# Patient Record
Sex: Female | Born: 1960
Health system: Southern US, Community
[De-identification: ages and names within clinical notes are randomized; demographics above are authoritative.]

## PROBLEM LIST (undated history)

## (undated) DIAGNOSIS — R51 Headache: Secondary | ICD-10-CM

## (undated) DIAGNOSIS — E785 Hyperlipidemia, unspecified: Secondary | ICD-10-CM

## (undated) DIAGNOSIS — IMO0001 Reserved for inherently not codable concepts without codable children: Secondary | ICD-10-CM

## (undated) DIAGNOSIS — L98499 Non-pressure chronic ulcer of skin of other sites with unspecified severity: Secondary | ICD-10-CM

## (undated) DIAGNOSIS — I4510 Unspecified right bundle-branch block: Secondary | ICD-10-CM

## (undated) DIAGNOSIS — E7800 Pure hypercholesterolemia, unspecified: Secondary | ICD-10-CM

## (undated) DIAGNOSIS — K3533 Acute appendicitis with perforation and localized peritonitis, with abscess: Secondary | ICD-10-CM

## (undated) DIAGNOSIS — N800 Endometriosis of uterus: Secondary | ICD-10-CM

## (undated) DIAGNOSIS — E079 Disorder of thyroid, unspecified: Secondary | ICD-10-CM

## (undated) DIAGNOSIS — N8003 Adenomyosis of the uterus: Secondary | ICD-10-CM

## (undated) DIAGNOSIS — N83209 Unspecified ovarian cyst, unspecified side: Secondary | ICD-10-CM

## (undated) DIAGNOSIS — R519 Headache, unspecified: Secondary | ICD-10-CM

## (undated) DIAGNOSIS — E039 Hypothyroidism, unspecified: Secondary | ICD-10-CM

## (undated) DIAGNOSIS — C801 Malignant (primary) neoplasm, unspecified: Secondary | ICD-10-CM

## (undated) DIAGNOSIS — E0500 Thyrotoxicosis with diffuse goiter without thyrotoxic crisis or storm: Secondary | ICD-10-CM

## (undated) DIAGNOSIS — K219 Gastro-esophageal reflux disease without esophagitis: Secondary | ICD-10-CM

## (undated) DIAGNOSIS — N809 Endometriosis, unspecified: Secondary | ICD-10-CM

## (undated) DIAGNOSIS — Z720 Tobacco use: Secondary | ICD-10-CM

## (undated) HISTORY — PX: APPENDECTOMY: SHX54

## (undated) HISTORY — DX: Hypothyroidism, unspecified: E03.9

## (undated) HISTORY — PX: OTHER SURGICAL HISTORY: SHX169

## (undated) HISTORY — DX: Endometriosis, unspecified: N80.9

## (undated) HISTORY — DX: Pure hypercholesterolemia, unspecified: E78.00

## (undated) HISTORY — DX: Gastro-esophageal reflux disease without esophagitis: K21.9

## (undated) HISTORY — PX: ABDOMINAL HYSTERECTOMY: SHX81

## (undated) HISTORY — DX: Disorder of thyroid, unspecified: E07.9

## (undated) HISTORY — DX: Tobacco use: Z72.0

## (undated) HISTORY — DX: Hyperlipidemia, unspecified: E78.5

## (undated) HISTORY — DX: Reserved for inherently not codable concepts without codable children: IMO0001

## (undated) HISTORY — DX: Acute appendicitis with perforation, localized peritonitis, and gangrene, with abscess: K35.33

## (undated) HISTORY — DX: Non-pressure chronic ulcer of skin of other sites with unspecified severity: L98.499

## (undated) HISTORY — DX: Endometriosis of uterus: N80.0

## (undated) HISTORY — DX: Unspecified ovarian cyst, unspecified side: N83.209

## (undated) HISTORY — DX: Thyrotoxicosis with diffuse goiter without thyrotoxic crisis or storm: E05.00

## (undated) HISTORY — DX: Unspecified right bundle-branch block: I45.10

## (undated) HISTORY — DX: Adenomyosis of the uterus: N80.03

---

## 1987-12-27 HISTORY — PX: TUBAL LIGATION: SHX77

## 1992-02-24 HISTORY — PX: BREAST BIOPSY: SHX20

## 1993-12-26 HISTORY — PX: OVARIAN CYST REMOVAL: SHX89

## 1993-12-26 HISTORY — PX: TOTAL ABDOMINAL HYSTERECTOMY W/ BILATERAL SALPINGOOPHORECTOMY: SHX83

## 1998-03-25 ENCOUNTER — Other Ambulatory Visit: Admission: RE | Admit: 1998-03-25 | Discharge: 1998-03-25 | Payer: Self-pay | Admitting: Obstetrics & Gynecology

## 1998-03-26 ENCOUNTER — Ambulatory Visit (HOSPITAL_COMMUNITY): Admission: RE | Admit: 1998-03-26 | Discharge: 1998-03-26 | Payer: Self-pay | Admitting: Obstetrics & Gynecology

## 1998-05-11 ENCOUNTER — Other Ambulatory Visit: Admission: RE | Admit: 1998-05-11 | Discharge: 1998-05-11 | Payer: Self-pay | Admitting: General Surgery

## 1998-05-21 ENCOUNTER — Ambulatory Visit (HOSPITAL_BASED_OUTPATIENT_CLINIC_OR_DEPARTMENT_OTHER): Admission: RE | Admit: 1998-05-21 | Discharge: 1998-05-21 | Payer: Self-pay | Admitting: General Surgery

## 1999-05-10 ENCOUNTER — Emergency Department (HOSPITAL_COMMUNITY): Admission: EM | Admit: 1999-05-10 | Discharge: 1999-05-10 | Payer: Self-pay

## 1999-05-18 ENCOUNTER — Ambulatory Visit (HOSPITAL_COMMUNITY): Admission: RE | Admit: 1999-05-18 | Discharge: 1999-05-18 | Payer: Self-pay | Admitting: Gastroenterology

## 1999-07-28 ENCOUNTER — Ambulatory Visit (HOSPITAL_COMMUNITY): Admission: RE | Admit: 1999-07-28 | Discharge: 1999-07-28 | Payer: Self-pay | Admitting: Obstetrics & Gynecology

## 2000-02-24 ENCOUNTER — Other Ambulatory Visit: Admission: RE | Admit: 2000-02-24 | Discharge: 2000-02-24 | Payer: Self-pay | Admitting: Obstetrics & Gynecology

## 2000-03-03 ENCOUNTER — Ambulatory Visit (HOSPITAL_COMMUNITY): Admission: RE | Admit: 2000-03-03 | Discharge: 2000-03-03 | Payer: Self-pay | Admitting: Obstetrics & Gynecology

## 2000-03-03 ENCOUNTER — Encounter: Payer: Self-pay | Admitting: Obstetrics & Gynecology

## 2000-10-15 ENCOUNTER — Emergency Department (HOSPITAL_COMMUNITY): Admission: EM | Admit: 2000-10-15 | Discharge: 2000-10-15 | Payer: Self-pay | Admitting: Emergency Medicine

## 2000-12-21 ENCOUNTER — Encounter (INDEPENDENT_AMBULATORY_CARE_PROVIDER_SITE_OTHER): Payer: Self-pay

## 2000-12-21 ENCOUNTER — Other Ambulatory Visit: Admission: RE | Admit: 2000-12-21 | Discharge: 2000-12-21 | Payer: Self-pay | Admitting: Otolaryngology

## 2001-03-05 ENCOUNTER — Other Ambulatory Visit: Admission: RE | Admit: 2001-03-05 | Discharge: 2001-03-05 | Payer: Self-pay | Admitting: Obstetrics & Gynecology

## 2002-01-22 ENCOUNTER — Encounter: Admission: RE | Admit: 2002-01-22 | Discharge: 2002-01-22 | Payer: Self-pay | Admitting: Obstetrics & Gynecology

## 2002-01-22 ENCOUNTER — Encounter: Payer: Self-pay | Admitting: Obstetrics & Gynecology

## 2002-03-04 ENCOUNTER — Other Ambulatory Visit: Admission: RE | Admit: 2002-03-04 | Discharge: 2002-03-04 | Payer: Self-pay | Admitting: Obstetrics & Gynecology

## 2002-03-21 ENCOUNTER — Ambulatory Visit (HOSPITAL_COMMUNITY): Admission: RE | Admit: 2002-03-21 | Discharge: 2002-03-21 | Payer: Self-pay | Admitting: General Surgery

## 2002-03-21 ENCOUNTER — Encounter (INDEPENDENT_AMBULATORY_CARE_PROVIDER_SITE_OTHER): Payer: Self-pay | Admitting: Specialist

## 2002-03-21 ENCOUNTER — Encounter: Payer: Self-pay | Admitting: General Surgery

## 2002-06-04 ENCOUNTER — Ambulatory Visit (HOSPITAL_COMMUNITY): Admission: RE | Admit: 2002-06-04 | Discharge: 2002-06-04 | Payer: Self-pay | Admitting: Obstetrics & Gynecology

## 2002-08-27 ENCOUNTER — Ambulatory Visit (HOSPITAL_COMMUNITY): Admission: RE | Admit: 2002-08-27 | Discharge: 2002-08-27 | Payer: Self-pay | Admitting: Gastroenterology

## 2002-11-05 ENCOUNTER — Encounter (INDEPENDENT_AMBULATORY_CARE_PROVIDER_SITE_OTHER): Payer: Self-pay | Admitting: *Deleted

## 2002-11-05 ENCOUNTER — Inpatient Hospital Stay (HOSPITAL_COMMUNITY): Admission: RE | Admit: 2002-11-05 | Discharge: 2002-11-07 | Payer: Self-pay | Admitting: Obstetrics & Gynecology

## 2003-02-24 ENCOUNTER — Other Ambulatory Visit: Admission: RE | Admit: 2003-02-24 | Discharge: 2003-02-24 | Payer: Self-pay | Admitting: Obstetrics & Gynecology

## 2004-03-30 ENCOUNTER — Other Ambulatory Visit: Admission: RE | Admit: 2004-03-30 | Discharge: 2004-03-30 | Payer: Self-pay | Admitting: Obstetrics & Gynecology

## 2004-04-12 ENCOUNTER — Encounter: Admission: RE | Admit: 2004-04-12 | Discharge: 2004-04-12 | Payer: Self-pay | Admitting: Family Medicine

## 2004-05-13 ENCOUNTER — Inpatient Hospital Stay (HOSPITAL_COMMUNITY): Admission: RE | Admit: 2004-05-13 | Discharge: 2004-05-14 | Payer: Self-pay | Admitting: Surgery

## 2004-05-13 ENCOUNTER — Encounter (INDEPENDENT_AMBULATORY_CARE_PROVIDER_SITE_OTHER): Payer: Self-pay | Admitting: Specialist

## 2004-05-13 HISTORY — PX: TOTAL THYROIDECTOMY: SHX2547

## 2004-05-15 ENCOUNTER — Emergency Department (HOSPITAL_COMMUNITY): Admission: EM | Admit: 2004-05-15 | Discharge: 2004-05-15 | Payer: Self-pay | Admitting: Emergency Medicine

## 2005-07-25 ENCOUNTER — Other Ambulatory Visit: Admission: RE | Admit: 2005-07-25 | Discharge: 2005-07-25 | Payer: Self-pay | Admitting: Obstetrics & Gynecology

## 2005-08-01 ENCOUNTER — Encounter: Admission: RE | Admit: 2005-08-01 | Discharge: 2005-08-01 | Payer: Self-pay | Admitting: Obstetrics & Gynecology

## 2005-11-23 ENCOUNTER — Encounter: Admission: RE | Admit: 2005-11-23 | Discharge: 2005-11-23 | Payer: Self-pay | Admitting: Family Medicine

## 2005-11-30 ENCOUNTER — Encounter: Admission: RE | Admit: 2005-11-30 | Discharge: 2005-11-30 | Payer: Self-pay | Admitting: Gastroenterology

## 2005-12-06 ENCOUNTER — Encounter (INDEPENDENT_AMBULATORY_CARE_PROVIDER_SITE_OTHER): Payer: Self-pay | Admitting: *Deleted

## 2005-12-06 ENCOUNTER — Ambulatory Visit (HOSPITAL_COMMUNITY): Admission: RE | Admit: 2005-12-06 | Discharge: 2005-12-06 | Payer: Self-pay | Admitting: Gastroenterology

## 2005-12-06 HISTORY — PX: ESOPHAGOGASTRODUODENOSCOPY: SHX1529

## 2006-08-29 ENCOUNTER — Emergency Department (HOSPITAL_COMMUNITY): Admission: EM | Admit: 2006-08-29 | Discharge: 2006-08-30 | Payer: Self-pay | Admitting: Emergency Medicine

## 2006-09-06 ENCOUNTER — Encounter: Admission: RE | Admit: 2006-09-06 | Discharge: 2006-09-06 | Payer: Self-pay | Admitting: Family Medicine

## 2007-11-14 ENCOUNTER — Encounter: Admission: RE | Admit: 2007-11-14 | Discharge: 2007-11-14 | Payer: Self-pay | Admitting: *Deleted

## 2010-04-24 ENCOUNTER — Emergency Department (HOSPITAL_BASED_OUTPATIENT_CLINIC_OR_DEPARTMENT_OTHER): Admission: EM | Admit: 2010-04-24 | Discharge: 2010-04-24 | Payer: Self-pay | Admitting: Emergency Medicine

## 2011-01-06 ENCOUNTER — Ambulatory Visit: Payer: Self-pay | Admitting: Cardiology

## 2011-03-24 ENCOUNTER — Other Ambulatory Visit: Payer: Self-pay | Admitting: Surgery

## 2011-05-13 NOTE — Op Note (Signed)
NAME:  Christina Watts, Christina Watts                          ACCOUNT NO.:  [id number]   MEDICAL RECORD NO.:  [id number]                   PATIENT TYPE:  INP   LOCATION:  9399                                 FACILITY:  [facility]   PHYSICIAN:  Freddy Finner, M.D.                DATE OF BIRTH:  Nov 12, 1961   DATE OF PROCEDURE:  11/05/2002  DATE OF DISCHARGE:                                 OPERATIVE REPORT   PREOPERATIVE DIAGNOSES:  1. Chronic pelvic pain.  2. Known previous pelvic adhesions.  3. Recurrent ovarian cysts.  4. Cystourethrocele with loss of urethrovesical angle and clinical symptom     of stress incontinence.   POSTOPERATIVE DIAGNOSES:  1. Chronic pelvic pain.  2. Known previous pelvic adhesions.  3. Recurrent ovarian cysts.  4. Cystourethrocele with loss of urethrovesical angle and clinical symptom     of stress incontinence.   OPERATIVE PROCEDURE:  Laparotomy with bilateral salpingo-oophorectomy and  Burch suspension of bladder.   SURGEON:  Freddy Finner, M.D.   ASSISTANT:  Stann Mainland. Grewal, M.D.   ESTIMATED INTRAOPERATIVE BLOOD LOSS:  Less than 100 cc.   INTRAOPERATIVE COMPLICATIONS:  None.   ANESTHESIA:  General endotracheal.   HISTORY:  The patient is a 50 year old admitted on the morning of surgery  for the procedure described above.  She was brought to the operating room  after being placed in PAS hose.  She was placed under adequate general  endotracheal anesthesia.  Placed in a dorsal recumbent position with frog  leg position of the legs.  The abdomen was prepped and draped in the usual  fashion.  A 30 cc triple lumen Foley was placed using sterile technique.  A  lower abdominal transverse scar was sharply excised.  The incision was  sharply carried on the fascia.  Subcutaneous vessels were controlled with  the Bovie.  Fascia was entered sharply and extended to the extent of skin  incision.  Rectus sheath was developed superiorly and inferiorly by blunt  and  sharp dissection.  Rectus muscles divided in the midline.  Peritoneum  was entered bluntly and extended sharply and bluntly to the extent of the  skin incision.  Palpation of the upper abdomen revealed no abnormality of  gallbladder, liver, kidneys.  Appendix was surgically absent.  Tubes and  ovaries were inspected.  Both were normal.  There was some minimal adhesions  underneath the right ovary.  The ovaries were elevated into the operative  field.  The infundibulopelvic ligament and round ligament pedicles were  developed separately.  Each was clamped, divided, ligated with a free tie of  0 Monocryl.  A suture ligature of 0 Monocryl was also placed to close the  infundibulopelvic.  Each side was done identically.  Irrigation was carried  out.  Close inspection revealed complete hemostasis.  All packs, needles,  and instruments were removed from the abdomen.  Counts  were correct.  The  peritoneum was then closed with running 0 Monocryl suture.  Space of Retzius  was then carefully dissected.  The urethrovesical angle was identified with  traction on the 30 cc Foley.  Using 2-0 PDS a suture was placed through the  vesicovaginal fascia approximately 1 cm lateral to and approximately 0.5 cm  inferior to the level of the urethrovesical junction.  This was anchored to  Cooper's ligament on each side.  A single suture was placed.  Each was tied  successfully without elevation of the urethrovesical angle while exploring  digitally vaginally.  Bladder was then filled with irrigating solution.  Suprapubic catheter was placed using a Bonanno type catheter.  This was  anchored to the skin with 4-0 nylon.  The abdominal incision was then  completely closed.  Running 0 PDS was used to close the fascia.  The  subcutaneous scarring was released, then reapproximated with a running 2-0  plain suture.  Skin was closed with broad skin staples and 0.25 inch Steri-  Strips.  The patient was awakened and taken  to recovery room in good  condition.                                               Freddy Finner, M.D.    WRN/MEDQ  D:  11/05/2002  T:  11/05/2002  Job:  [id number]

## 2011-05-13 NOTE — Op Note (Signed)
NAME:  Christina Watts, Christina Watts                          ACCOUNT NO.:  [id number]   MEDICAL RECORD NO.:  [id number]                   PATIENT TYPE:  INP   LOCATION:  2869                                 FACILITY:  [facility]   PHYSICIAN:  Velora Heckler, M.D.                DATE OF BIRTH:  [birth year]   DATE OF PROCEDURE:  05/13/2004  DATE OF DISCHARGE:                                 OPERATIVE REPORT   PREOPERATIVE DIAGNOSIS:  Multiple thyroid nodules, dominant mass, left  thyroid lobe.   POSTOPERATIVE DIAGNOSIS:  Multiple thyroid nodules, dominant mass, left  thyroid lobe.   OPERATION PERFORMED:  Total thyroidectomy (completion).   SURGEON:  Velora Heckler, M.D.   ASSISTANT:  Sheppard Plumber. Earlene Plater, M.D.   ANESTHESIA:  General.   ANESTHESIOLOGIST:  Quita Skye. Krista Blue, M.D.   ESTIMATED BLOOD LOSS:  Minimal.   PREPARATION:  Betadine.   COMPLICATIONS:  None.   INDICATIONS FOR PROCEDURE:  The patient is a 50 year old white female  patient of Dr. Kendrick Ranch followed with multiple thyroid nodules.  The  patient had had a previous partial thyroidectomy performed by Rose Phi.  Young, M.D. in [phone] for hyperplastic nodule.  The patient has now developed  a dominant nodule in the left thyroid lobe.  Ultrasound shows this to be a  solid nodule measuring 2.8 cm in dimension.  There were multiple nodules  scattered in the left and right lobes of the gland.  The patient now comes  to surgery for resection of the entire remaining thyroid gland.   DESCRIPTION OF PROCEDURE:  The procedure was done in operating room #16 at  the [facility].  The patient was brought to the  operating room and placed in supine position on the operating room table.  Following administration of general anesthesia, the patient was prepped and  draped in the usual strict aseptic fashion.  After ascertaining that an  adequate level of anesthesia had been obtained, the patient's previous  Kocher incision was reopened  with a #10 blade.  Dissection was carried down  through the subcutaneous tissues and platysma.  Hemostasis was obtained with  the electrocautery.  Skin flaps were developed cephalad and caudad from the  thyroid notch to the sternal notch.  A Mahorner self-retaining retractor was  placed for exposure.  Strap muscles were opened in the midline old scar  tissue was dissected down to the trachea.  The isthmus appears to be  surgically absent and I suspect that the procedure in 1994 was an  isthmusectomy.  Dissection was begun on the left side.  Strap muscles were  reflected laterally.  The left thyroid lobe was mobilized from its  surrounding tissues.  There was a moderate amount of scar tissue but this  was easily divided.  Middle thyroid vein was divided between small  Ligaclips. The inferior venous tributaries were divided between medium  Ligaclips.  The mass in the left lobe occupies essentially the entire left  thyroid lobe with the exception of the superior pole.  There is a  parathyroid gland identified on the surface of the firm nodule  It appears  to be on a very tenuous vascular pedicle which cannot be preserved.  Therefore, the left inferior gland was excised, cut into small 1 mm  fragments, and reimplanted into the left sternocleidomastoid muscle.  Muscular pocket was closed with a 3-0 Vicryl figure-of-eight suture.  Continuing dissection along the superior pole, the superior parathyroid  gland was identified and preserved.  Superior pole vessels were ligated in  continuity with 2-0 silk ties and medium Ligaclips and divided.  Gland was  rolled anteriorly.  Branches of the inferior thyroid artery were divided  between small Ligaclips.  Ligament of Allyson Sabal was transected and the gland was  rolled medially to the central scar tissue.  The gland was completely  excised with the electrocautery and the left thyroid lobe was then submitted  to pathology for review.  Good hemostasis was  noted in the left neck and a  pack was placed in the bed of the thyroid.   Next we turned our attention to the right lobe.  Again, strap muscles were  reflected laterally.  There is moderate scar tissue centrally.  The right  thyroid lobe was identified, however.  It was carefully out.  Venous  tributaries were divided between medium Ligaclips.  Superior pole was  mobilized and dissected out.  It was ligated in continuity with 2-0 silk  ties and medium Ligaclips and divided.  The gland was rolled medially.  There was a large tubercle of Zuckerkandl.  This was carefully dissected  out.  Vascular tributaries divided between small Ligaclips.  Superior  parathyroid tissue was identified and preserved.  Branches of the inferior  thyroid artery were divided between small Ligaclips.  The ligament of Allyson Sabal  was transected and the gland was rolled medially.  The inferior venous  tributaries were divided between medium Ligaclips .  The gland was rolled up  onto the anterior trachea where it was excised from the scar tissue with the  electrocautery.  The right thyroid lobe was submitted separately to  pathology for review.  The right neck was irrigated with warm saline.  Surgicel was placed over the area of the recurrent nerve and parathyroid  glands.  The left neck was irrigated and again Surgicel was placed over the  area of the recurrent laryngeal nerve and parathyroid glands.  Good  hemostasis was noted.  Strap muscles were reapproximated in the midline with  interrupted 3-0 Vicryl sutures.  The platysma was closed with interrupted 3-  0 Vicryl sutures.  The skin was closed with a running 4-0 Vicryl  subcuticular suture.  The wound was washed and dried.  Benzoin and Steri-Strips were applied.  Sterile dressings were applied.  The  patient was awakened from anesthesia and brought to the recovery room in stable condition.  The patient tolerated the procedure well.                                                Velora Heckler, M.D.    TMG/MEDQ  D:  05/13/2004  T:  05/14/2004  Job:  [id number]   cc:   Jeannett Senior A. Clent Ridges, M.D. [facility]  Freddy Finner, M.D.  [address] [place]  [place] [address]  Fax: [phone]

## 2011-05-13 NOTE — Discharge Summary (Signed)
NAME:  Christina Watts, Christina Watts                          ACCOUNT NO.:  [id number]   MEDICAL RECORD NO.:  [id number]                   PATIENT TYPE:  INP   LOCATION:  9304                                 FACILITY:  [facility]   PHYSICIAN:  Freddy Finner, M.D.                DATE OF BIRTH:  1961/01/31   DATE OF ADMISSION:  11/05/2002  DATE OF DISCHARGE:  11/07/2002                                 DISCHARGE SUMMARY   DISCHARGE DIAGNOSES:  1. Symptomatic cystic urethrocele.  2. Chronic pelvic pain.  3. Right paraovarian adhesions.   OPERATIVE PROCEDURES:  1. Exploratory laparotomy.  2. Bilateral salpingo-oophorectomy.  3. Burch suspension of bladder.   POSTOPERATIVE COMPLICATIONS:  None.   DISPOSITION:  The patient is in satisfactory condition at the time of her  discharge.  Her suprapubic catheter was removed on the day of discharge.   ACTIVITY:  She is to have progressive increase in physical activity, but no  heavy lifting and no vaginal entry.   FOLLOW-UP:  She is to return to the office in two weeks for postoperative  follow-up.   DISCHARGE MEDICATIONS:  1. Percocet 5 mg one or two every four to six hours as needed for     postoperative pain.  2. Zofran 8 mg oral dissolving tablets to be taken one every six to eight     hours as needed for nausea or vomiting.  3. Vivelle-Dot skin patch for hormone-replacement therapy.  4. Z-PAK for green nasal drainage and sinusitis symptoms.   HISTORY:  The history of present illness, past history, family history,  review of systems, and physical exam are recorded in the admission note  and/or in the operative summary.  The patient was admitted on the morning of  surgery.   PHYSICAL EXAMINATION:  Her physical findings were remarkable for  cystourethrocele.  There were no other palpable abnormalities on pelvic  exam.   LABORATORY DATA:  The laboratory data during this included a hemoglobin of  13.4 on admission and 11.7 postoperatively.  The  prothrombin time and PTT  were normal on admission as was the urinalysis.   HOSPITAL COURSE:  Following admission, the above-stated operative procedure  was accomplished without difficulty.  Postoperatively the patient's course  was entirely satisfactory.  She remained afebrile throughout.  By the  morning of the second day, she was having adequate bladder function with  voided amounts greater than or equal to 200 cc and residuals of 50 cc or  less.  She was ambulating without difficulty.  She was tolerating a regular  diet.  Her suprapubic catheter was removed.  Skin staples were removed.  Her  condition was satisfactory and she was discharged with disposition as noted  above.  Freddy Finner, M.D.    WRN/MEDQ  D:  11/07/2002  T:  11/07/2002  Job:  [id number]

## 2011-05-13 NOTE — Op Note (Signed)
NAME:  Christina, Watts                          ACCOUNT NO.:  [id number]   MEDICAL RECORD NO.:  [id number]                   PATIENT TYPE:  AMB   LOCATION:  ENDO                                 FACILITY:  [facility]   PHYSICIAN:  Charolett Bumpers, M.D.             DATE OF BIRTH:  [birth year]   DATE OF PROCEDURE:  DATE OF DISCHARGE:                                 OPERATIVE REPORT   PROCEDURE:  Colonoscopy.   INDICATIONS FOR PROCEDURE:  [name] is a 50 year old female born  12/30/1960. [name] has chronic lower abdominal-pelvic pain. She  has undergone a hysterectomy in the past and is scheduled to undergo a  bilateral oophorectomy by Dr. Konrad Dolores. Preoperative colonoscopy is  requested by Dr. Jennette Kettle prior to scheduling [name] for her gynecologic  surgery.   [name] develops nonbloody diarrhea under stress but denies a personal  or family history of colon cancer and denies gastrointestinal bleeding.   [name] does have gastroesophageal reflux manifested by heartburn. Her  May 18, 1999 esophagogastroduodenoscopy was normal. Her June 2000 abdominal  ultrasound was normal.   [name] viewed the colonoscopy education film in my office and I  discussed with her the complications associated with colonoscopy and  polypectomy including a 15/1000 risk of bleeding and 03/999 risk of colon  perforation requiring surgical repair. [name] has signed the operative  permit.   ENDOSCOPIST:  Charolett Bumpers, M.D.   PREMEDICATION:  Fentanyl 100 mcg, Versed 10 mg.   ENDOSCOPE:  Olympus pediatric colonoscope.   DESCRIPTION OF PROCEDURE:  After obtaining informed consent, [name] was  placed in the left lateral decubitus position. I administered intravenous  fentanyl and intravenous Versed to achieve conscious sedation for the  procedure. The patient's blood pressure, oxygen saturation and cardiac  rhythm were monitored throughout the procedure and  documented in the medical  record.   Anal inspection was normal. Digital rectal exam was normal.  The Olympus  pediatric video colonoscope was introduced into the rectum and easily  advanced to the cecum. A normal appearing ileocecal valve was intubated and  the distal ileum inspected. Colonic preparation for the exam today was  excellent.   RECTUM:  Normal.   SIGMOID COLON AND DESCENDING COLON:  Normal.   SPLENIC FLEXURE:  Normal.   TRANSVERSE COLON:  Normal.   HEPATIC FLEXURE:  Normal.   ASCENDING COLON:  Normal.   CECUM AND ILEOCECAL VALVE:  Normal.   DISTAL ILEUM:  Normal.   ASSESSMENT:  Normal proctocolonoscopy to the cecum with intubation of a  normal appearing ileocecal valve and distal ileal inspection.  Charolett Bumpers, M.D.    MKJ/MEDQ  D:  08/27/2002  T:  08/27/2002  Job:  [id number]   cc:   Freddy Finner, M.D.  [address] [place]  [place] [address]  Fax: [phone]

## 2011-05-13 NOTE — Op Note (Signed)
[facility]  Patient:    Christina Watts, Christina Watts Visit Number: [id number] MRN: [id number]          Service Type: DSU Location: [facility] Attending Physician:  Minette Headland Dictated by:   Freddy Finner, M.D. Proc. Date: 06/04/02 Admit Date:  06/04/2002                             Operative Report  PREOPERATIVE DIAGNOSES:       1. Chronic, recurrent pelvic pain.                               2. Recurrent follicular or functional ovarian                                  cyst.                               3. Previous history of pelvic adhesions.  POSTOPERATIVE DIAGNOSIS:      1. Chronic, recurrent pelvic pain.                               2. Recurrent follicular or functional ovarian                                  cyst.                               3. Previous history of pelvic adhesions.                               4. Minimal evidence of adhesions, ileocecal                                  junction to pelvic peritoneal brim and                                  adhesions of right ovary to pelvic                                  peritoneum.  OPERATIONS:                   1. Laparoscopy.                               2. Lysis of adhesions.  SURGEON:                      Freddy Finner, M.D.  ANESTHESIA:                   General anesthesia.  COMPLICATIONS:                None.  ESTIMATED BLOOD LOSS:  10 cc.  INDICATIONS:                  The details of the present illness are explained in the admission note.  DESCRIPTION OF PROCEDURE:     The patient was admitted on the morning of surgery, brought to the operating room, placed under adequate general endotracheal anesthesia, placed in the dorsal lithotomy position using the Allen stirrups.  A Betadine prep was carried out in the usual fashion.  The bladder was evacuated using sterile technique.  Sterile drapes were applied, and a sponge forceps with a sponge was placed in the vaginal  canal.  An incision was made at the umbilicus and one just above the symphysis.  An 11 mm trocar was introduced at the umbilicus, elevating the anterior abdominal wall manually.  Direct inspection revealed adequate placement with no evidence of injury on entry.  Pneumoperitoneum was allowed to accumulate with carbon dioxide gas.  A 5 mm trocar was placed.  A careful inspection of pelvic and abdominal contents was carried out and photographs were made.  Minimal adhesions at the right ovary and at the pelvic brim to the bowel were lysed with the reticulated Endoshears through the operating channel of the laparoscope.  A 5 mm trocar placed in the lower incision _____ shears for grasping and probe for manipulation during the procedure.  Bleeding sources were controlled with the bipolar cautery.  Hemostasis was noted to be adequate under reduced intra-abdominal pressure.  Instruments were removed.  Gas was allowed to escape.  The incisions were anesthetized with 0.25% plain Marcaine. The patient was given 30 cc of Toradol IV.  She was awakened, taken to the recovery room in good condition after the incisions were closed with 3-0 Dexon and quarter-inch Steri-Strips.  The patient tolerated the operative procedure well and was taken to recovery in good condition. Dictated by:   Freddy Finner, M.D. Attending Physician:  Minette Headland DD:  06/04/02 TD:  06/04/02 Job: [id number]

## 2011-05-13 NOTE — Op Note (Signed)
[facility]  Patient:    Christina Watts, Christina Watts Visit Number: [id number] MRN: [id number]          Service Type: DSU Location: DAY Attending Physician:  Carson Myrtle Dictated by:   Sheppard Plumber Earlene Plater, M.D. Proc. Date: 03/21/02 Admit Date:  03/21/2002   CC:         Freddy Finner, M.D.   Operative Report  PREOPERATIVE DIAGNOSIS:  Palpable mass, right breast.  POSTOPERATIVE DIAGNOSIS:  Palpable mass, right breast.  PROCEDURE:  Excision of mass, right breast.  SURGEON:  Timothy E. Earlene Plater, M.D.  Threasa HeadsGilman Buttner.  ANESTHESIA:  Local standby.  INDICATIONS:  Ms. Mayor is 8, otherwise healthy with a prior right breast biopsy. She has a new palpable mass, right breast. Mammography is negative. Ultrasound suggests cysts but because of her overly anxious feelings about the breast lump, we have agreed to proceed with a biopsy.  DESCRIPTION OF PROCEDURE:  The patient was brought to the operating room and evaluated by anesthesia. IV was started, heavy sedation given. She was placed supine. The right breast mass had been localized by she and myself and it was marked. It was in the upper outer quadrant, right breast. The breast was prepped and draped in the usual fashion. Marcaine 0.25% with epinephrine was used throughout for local anesthesia. A small incision was made, subcutaneous was dissected. The breast mass was palpable. It was grasp and sharply dissected from the surrounding tissue. Her other remaining breast tissue revealed no masses. It was fibrocystic in nature and this appears to be a small fibroadenoma, certainly benign. Bleeding was controlled with cautery. The wound was closed in layers with 3-0 Monocryl. Counts were correct. She tolerated it well and was taken to the recovery room in good condition.  Written and verbal instructions were given to her and her husband and she will be followed as an outpatient. Dictated by:   Sheppard Plumber  Earlene Plater, M.D. Attending Physician:  Carson Myrtle DD:  03/21/02 TD:  03/21/02 Job: [id number]

## 2011-05-13 NOTE — Op Note (Signed)
NAMEHINA, GUPTA                ACCOUNT NO.:  [id number]   MEDICAL RECORD NO.:  [id number]          PATIENT TYPE:  AMB   LOCATION:  ENDO                         FACILITY:  [facility]   PHYSICIAN:  Danise Edge, M.D.   DATE OF BIRTH:  11-07-1961   DATE OF PROCEDURE:  12/06/2005  DATE OF DISCHARGE:                                 OPERATIVE REPORT   PROCEDURE:  Esophagogastroduodenoscopy.   REFERRING PHYSICIAN:  Dellis Anes. Idell Pickles, M.D.   INDICATIONS FOR PROCEDURE:  Ms. Girtrude Enslin is a 50 year old female born  [birth year].  Despite taking a proton pump inhibitor, Ms. Stanke is  experiencing heartburn, intermittent dysphagia, and abdominal bloating.  She  recently underwent a barium swallow, barium tablet swallow, and upper GI x-  ray series, which was normal except for a possible tiny ulcer at the apex of  the duodenal bulb.   ENDOSCOPIST:  Danise Edge, M.D.   PREMEDICATION:  Versed 7.5 mg, Demerol 50 mg.   PROCEDURE:  After obtaining informed consent, Ms. Rybolt was placed in the  left lateral decubitus position.  I administered intravenous Demerol and  intravenous Versed to achieve conscious sedation for the procedure.  Patient's blood pressure, oxygen saturation, and cardiac rhythm were  monitored throughout the procedure and documented in the medical record.   The Olympus gastroscope was passed through the posterior hypopharynx and the  proximal esophagus without difficulty.  The hypopharynx, larynx, and vocal  cords appeared normal.   ESOPHAGOSCOPY:  The proximal, mid, and lower segments of the esophageal  mucosa appear completely normal.  The squamocolumnar junction and  esophagogastric junction are noted at 40 cm from the incisor teeth.  There  is no endoscopic evidence for the presence of erosive esophagitis,  esophageal stricture formation, or Barrett's esophagus.   GASTROSCOPY:  Retroflexed view of the gastric cardia and fundus was normal.  The gastric body,  antrum, and pylorus appeared completely normal.  Two  biopsies were taken from the distal gastric antrum to look for H. pylori  gastritis.   DUODENOSCOPY:  The duodenal bulb and the second portion of the duodenum  appeared completely normal.   ASSESSMENT:  Normal esophagogastroduodenoscopy.  Biopsies taken from the  distal gastric antrum to rule out Helicobacter pylori are pending.   PLAN:  I will meet with Ms. Mckown in my office to go over her gastric  biopsies.  If they are normal, I will schedule her for a nuclear medicine  gastric emptying study.           ______________________________  Danise Edge, M.D.     MJ/MEDQ  D:  12/06/2005  T:  12/06/2005  Job:  [id number]   cc:   Dellis Anes. Idell Pickles, M.D.  Fax: [phone]

## 2011-05-13 NOTE — H&P (Signed)
NAME:  Christina Watts, Christina Watts                          ACCOUNT NO.:  [id number]   MEDICAL RECORD NO.:  [id number]                   PATIENT TYPE:  INP   LOCATION:  NA                                   FACILITY:  [facility]   PHYSICIAN:  Freddy Finner, M.D.                DATE OF BIRTH:  1961-05-25   DATE OF ADMISSION:  11/05/2002  DATE OF DISCHARGE:                                HISTORY & PHYSICAL   ADMITTING DIAGNOSES:  Chronic right pelvic pain, first degree cystocele with  loss of urethrovesical angle and stress urinary incontinence.   HISTORY OF PRESENT ILLNESS:  The patient is a 50 year old white married  female, gravida 4, para 3 who has a long history of GYN symptoms and  problems.  In 1995, she had a total abdominal hysterectomy, right ovarian  cystectomy.  Histologically, she was found to have uterine adenomyosis  following hysterectomy. At that time she also had appendectomy for  obliteration of the appendiceal tip.  This was associated with  periappendiceal adhesions.  Subsequent to that time she has had repeat  laparoscopies for chronic, particularly right sided, pelvic pain on two  occasions, one in 2000 and again in June of 2003.  At each time, pelvic  adhesions were noted but no other significant pelvic pathology.  Because of  the persistence of her symptoms and episodes of diarrhea interspersed with  constipation, she was referred to Dr. Danise Edge for colonoscopy which  was accomplished in the recent past with a finding of a completely normal  colon, rectum, sigmoid and terminal ileum.  Because of the persistence of  her symptoms, she is now admitted for right salpingo-oophorectomy.  She had  a further complaint of pure stress incontinence and anatomically has lost  the urethrovesical angle with a small cystocele.  The plan is for right  salpingo-oophorectomy and Burch suspension of the bladder.   REVIEW OF SYSTEMS:  Her current review of systems is otherwise negative.   PAST MEDICAL HISTORY:  The patient does have a history of ulcer disease.  She is currently asymptomatic and not on medication.  She has a long history  of recurring ovarian cysts.  There are no other known significant medical  illnesses.   ALLERGIES:  She has allergies to Phenergan but no other medications.   SOCIAL HISTORY:  She is a cigarette smoker, smoking approximately 1/2 pack  per day for approximately 10 years.   PAST SURGICAL HISTORY:  Previous surgical procedures include vaginal  deliveries on three occasions.  She had an elective pregnancy termination  and tubal ligation in 1989.  She had laparoscopies as noted above.  She had  a hysterectomy in 1995 as noted above.  She had biopsy of benign breast mass  in March of 1993.   FAMILY HISTORY:  Noncontributory.   PHYSICAL EXAMINATION:  HEENT:  Grossly within normal limits.  NECK:  Thyroid  gland is not palpable enlarged.  VITAL SIGNS:  Blood pressure in the office is 110/60.  CHEST:  Clear to auscultation.  HEART:  Normal sinus rhythm without murmurs, rubs or gallops.  ABDOMEN:  Soft.  There is minimal right lower quadrant tenderness but no  palpable masses.  No appreciable organomegaly.  BREAST:  Exam is considered to be normal.  No palpable masses.  No nipple  discharge or skin change.  PELVIC:  Remarkable for findings as noted above, i.e., first degree  cystocele with loss of the urethrovesical angle.  There are no palpable  masses or enlargement of the ovaries to bimanual examination.  The rectum is  palpably normal.   ASSESSMENT:  1. Long standing chronic right pelvic pain with recurrent pelvic adhesions.  2. 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   PLAN:  Right salpingo-oophorectomy, Burch suspension of bladder.  The  patient has reviewed videos in the office describing the procedures and is  prepared to proceed with surgery.                                                Freddy Finner, M.D.    WRN/MEDQ  D:  11/04/2002  T:  11/05/2002  Job:  [id number]

## 2011-05-20 ENCOUNTER — Emergency Department (HOSPITAL_BASED_OUTPATIENT_CLINIC_OR_DEPARTMENT_OTHER)
Admission: EM | Admit: 2011-05-20 | Discharge: 2011-05-20 | Disposition: A | Discharge status: Home / Self Care | Payer: BC Managed Care – PPO | Attending: Emergency Medicine | Admitting: Emergency Medicine

## 2011-05-20 DIAGNOSIS — J329 Chronic sinusitis, unspecified: Secondary | ICD-10-CM | POA: Insufficient documentation

## 2011-05-20 DIAGNOSIS — K1370 Unspecified lesions of oral mucosa: Secondary | ICD-10-CM | POA: Insufficient documentation

## 2011-05-20 DIAGNOSIS — H9210 Otorrhea, unspecified ear: Secondary | ICD-10-CM | POA: Insufficient documentation

## 2011-05-20 DIAGNOSIS — Z79899 Other long term (current) drug therapy: Secondary | ICD-10-CM | POA: Insufficient documentation

## 2011-05-20 DIAGNOSIS — R05 Cough: Secondary | ICD-10-CM | POA: Insufficient documentation

## 2011-05-20 DIAGNOSIS — R059 Cough, unspecified: Secondary | ICD-10-CM | POA: Insufficient documentation

## 2011-05-20 DIAGNOSIS — E039 Hypothyroidism, unspecified: Secondary | ICD-10-CM | POA: Insufficient documentation

## 2011-06-02 ENCOUNTER — Other Ambulatory Visit: Payer: Self-pay | Admitting: *Deleted

## 2011-06-02 ENCOUNTER — Other Ambulatory Visit: Payer: BC Managed Care – PPO | Admitting: *Deleted

## 2011-06-02 DIAGNOSIS — E785 Hyperlipidemia, unspecified: Secondary | ICD-10-CM

## 2011-06-03 ENCOUNTER — Other Ambulatory Visit (INDEPENDENT_AMBULATORY_CARE_PROVIDER_SITE_OTHER): Payer: BC Managed Care – PPO | Admitting: *Deleted

## 2011-06-03 ENCOUNTER — Encounter: Payer: Self-pay | Admitting: *Deleted

## 2011-06-03 DIAGNOSIS — E785 Hyperlipidemia, unspecified: Secondary | ICD-10-CM

## 2011-06-03 LAB — HEPATIC FUNCTION PANEL
ALT: 19 U/L (ref 0–35)
AST: 23 U/L (ref 0–37)
Albumin: 4 g/dL (ref 3.5–5.2)
Alkaline Phosphatase: 40 U/L (ref 39–117)
Bilirubin, Direct: 0.1 mg/dL (ref 0.0–0.3)
Total Bilirubin: 0.7 mg/dL (ref 0.3–1.2)
Total Protein: 6.7 g/dL (ref 6.0–8.3)

## 2011-06-03 LAB — BASIC METABOLIC PANEL
BUN: 18 mg/dL (ref 6–23)
CO2: 29 mEq/L (ref 19–32)
Calcium: 7.9 mg/dL — ABNORMAL LOW (ref 8.4–10.5)
Chloride: 101 mEq/L (ref 96–112)
Creatinine, Ser: 0.6 mg/dL (ref 0.4–1.2)
GFR: 105.75 mL/min (ref >60.00)
Glucose, Bld: 79 mg/dL (ref 70–99)
Potassium: 3.9 mEq/L (ref 3.5–5.1)
Sodium: 138 mEq/L (ref 135–145)

## 2011-06-03 LAB — LIPID PANEL
Cholesterol: 160 mg/dL (ref 0–200)
HDL: 73.5 mg/dL (ref >39.00)
LDL Cholesterol: 78 mg/dL (ref 0–99)
Total CHOL/HDL Ratio: 2
Triglycerides: 43 mg/dL (ref 0.0–149.0)
VLDL: 8.6 mg/dL (ref 0.0–40.0)

## 2011-06-06 ENCOUNTER — Encounter: Payer: Self-pay | Admitting: Cardiology

## 2011-06-06 ENCOUNTER — Encounter: Payer: Self-pay | Admitting: *Deleted

## 2011-06-06 ENCOUNTER — Ambulatory Visit (INDEPENDENT_AMBULATORY_CARE_PROVIDER_SITE_OTHER): Payer: BC Managed Care – PPO | Admitting: Cardiology

## 2011-06-06 VITALS — BP 108/68 | HR 60 | Ht 62.5 in | Wt 127.1 lb

## 2011-06-06 DIAGNOSIS — R0789 Other chest pain: Secondary | ICD-10-CM | POA: Insufficient documentation

## 2011-06-06 DIAGNOSIS — E7800 Pure hypercholesterolemia, unspecified: Secondary | ICD-10-CM | POA: Insufficient documentation

## 2011-06-06 DIAGNOSIS — E785 Hyperlipidemia, unspecified: Secondary | ICD-10-CM

## 2011-06-06 NOTE — Assessment & Plan Note (Signed)
This doesn't really sound anginal in nature. We did do an EKG today which was normal. She has a chronic RSR prime in V1 and V2. Her lipids her HDL proximal has 78. Low-cholesterol: 160 otherwise. She doesn't have any glucose elevation. Overall, I think she is doing well. I'll have her see Dr. Shirlee Latch in approximate 8 months or as needed.

## 2011-06-06 NOTE — Assessment & Plan Note (Signed)
She is on simvastatin and tolerating that well.

## 2011-06-06 NOTE — Progress Notes (Signed)
Subjective:   He comes in today for a followup visit. She's had some hoarseness and tightness in her throat. Is not really anginal in character. She stopped smoking about a month ago was felt tired. She doesn't have any sputum production. 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 She has a positive family history of heart disease.  Current Outpatient Prescriptions  Medication Sig Dispense Refill  . Acetaminophen-Caffeine (EXCEDRIN TENSION HEADACHE PO) Take 1-2 tablets by mouth as needed.        . butalbital-acetaminophen-caffeine (FIORICET WITH CODEINE) 50-325-40-30 MG per capsule Take 1-2 capsules by mouth as needed.      . calcium carbonate (OS-CAL) 600 MG TABS Take 600 mg by mouth 2 (two) times daily with a meal.        . estradiol (ESTRACE) 2 MG tablet Take 1 tablet by mouth Daily.      Marland Kitchen omeprazole (PRILOSEC) 40 MG capsule Take 1 capsule by mouth Daily.      . simvastatin (ZOCOR) 80 MG tablet Take 1 tablet by mouth at bedtime.      Marland Kitchen SYNTHROID 100 MCG tablet Take 1 tablet by mouth Daily.        Allergies  Allergen Reactions  . Phenergan     There is no problem list on file for this patient.   History  Smoking status  . Former Smoker -- 0.5 packs/day for 19 years  . Types: Cigarettes  . Quit date: 05/09/2011  Smokeless tobacco  . Never Used    History  Alcohol Use No    Family History  Problem Relation Age of Onset  . Heart disease Father   . Asthma Mother   . Diabetes Mother   . Heart disease      fathers side has a strong history of heart disease  . Hyperlipidemia      strong family history of  . Heart attack Father     Review of Systems:   The patient denies any heat or cold intolerance.  No weight gain or weight loss.  The patient denies headaches or blurry vision.  There is no cough or sputum production.  The patient denies dizziness.  There is no hematuria or hematochezia.  The patient denies any muscle  aches or arthritis.  The patient denies any rash.  The patient denies frequent falling or instability.  There is no history of depression or anxiety.  All other systems were reviewed and are negative.   Physical Exam:   Weight is 127. Blood pressure 108/68 sitting, heart rate 60. She is a thyroidectomy scar. Lungs are coarse. Heart shows a regular rate and rhythm without murmur or gallop. Soft nontender extremities without edema Assessment / Plan:

## 2011-06-08 ENCOUNTER — Encounter: Payer: Self-pay | Admitting: Cardiology

## 2011-07-13 ENCOUNTER — Other Ambulatory Visit: Payer: Self-pay

## 2011-08-16 ENCOUNTER — Telehealth: Payer: Self-pay | Admitting: Cardiology

## 2011-08-16 NOTE — Telephone Encounter (Signed)
Faxed Last OV and EKG today to [phone], no cath/echo/stress were available

## 2011-08-16 NOTE — Telephone Encounter (Signed)
[facility] last ov, ekg, cath, echo, stress faxed to [phone]

## 2011-08-31 ENCOUNTER — Encounter (HOSPITAL_BASED_OUTPATIENT_CLINIC_OR_DEPARTMENT_OTHER): Payer: Self-pay | Admitting: *Deleted

## 2011-08-31 ENCOUNTER — Emergency Department (HOSPITAL_BASED_OUTPATIENT_CLINIC_OR_DEPARTMENT_OTHER)
Admission: EM | Admit: 2011-08-31 | Discharge: 2011-08-31 | Disposition: A | Discharge status: Home / Self Care | Payer: BC Managed Care – PPO | Attending: Emergency Medicine | Admitting: Emergency Medicine

## 2011-08-31 ENCOUNTER — Emergency Department (INDEPENDENT_AMBULATORY_CARE_PROVIDER_SITE_OTHER): Payer: BC Managed Care – PPO

## 2011-08-31 DIAGNOSIS — E785 Hyperlipidemia, unspecified: Secondary | ICD-10-CM | POA: Insufficient documentation

## 2011-08-31 DIAGNOSIS — R05 Cough: Secondary | ICD-10-CM

## 2011-08-31 DIAGNOSIS — E7800 Pure hypercholesterolemia, unspecified: Secondary | ICD-10-CM | POA: Insufficient documentation

## 2011-08-31 DIAGNOSIS — R509 Fever, unspecified: Secondary | ICD-10-CM

## 2011-08-31 DIAGNOSIS — E079 Disorder of thyroid, unspecified: Secondary | ICD-10-CM | POA: Insufficient documentation

## 2011-08-31 DIAGNOSIS — J40 Bronchitis, not specified as acute or chronic: Secondary | ICD-10-CM

## 2011-08-31 DIAGNOSIS — R059 Cough, unspecified: Secondary | ICD-10-CM

## 2011-08-31 MED ORDER — HYDROCODONE-ACETAMINOPHEN 5-325 MG PO TABS
2.0000 | ORAL_TABLET | Freq: Once | ORAL | Status: AC
  Administered 2011-08-31: 2 via ORAL
  Filled 2011-08-31: qty 2

## 2011-08-31 MED ORDER — AZITHROMYCIN 250 MG PO TABS: 250.0000 mg | ORAL_TABLET | Freq: Every day | ORAL | Status: AC

## 2011-08-31 MED ORDER — AZITHROMYCIN 250 MG PO TABS: 250.0000 mg | ORAL_TABLET | Freq: Every day | ORAL | Status: DC

## 2011-08-31 NOTE — ED Notes (Signed)
Pt c/o generalized body aches and fever x 2 days

## 2011-08-31 NOTE — ED Provider Notes (Signed)
History     CSN: [id number] Arrival date & time: 08/31/2011  7:49 PM  Chief Complaint  Patient presents with  . Fever  . Generalized Body Aches   Patient is a 50 y.o. female presenting with cough.  Cough This is a new problem. The current episode started yesterday. The problem occurs constantly. The problem has been rapidly worsening. The cough is productive of sputum. The maximum temperature recorded prior to her arrival was 101 to 101.9 F. The fever has been present for 1 to 2 days. Associated symptoms include chills, headaches, rhinorrhea and myalgias. She has tried nothing for the symptoms. The treatment provided no relief. Risk factors: family member had similiar. She is not a smoker. Her past medical history does not include bronchitis, pneumonia, COPD, emphysema or asthma.    Past Medical History  Diagnosis Date  . 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 This was associated with  Subsequent to that time she has had repeat laparoscopies for chronic, particularly right sided, pelvic pain on two occasions, one in 2000 and again in June of 2003.  At each time, pelvic adhesions were noted but no other significant pelvic pathology  . Ulcer disease     currently asymptomatic and not on medication  . Other and unspecified ovarian cysts     long history of recurring ovarian cysts  . Hyperlipidemia   . Thyroid disease   . Right bundle branch block   . Hypercholesterolemia   . Tobacco abuse   . 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 Hypothyroidism   . GERD (gastroesophageal reflux disease)     Past Surgical History  Procedure Date  . Total abdominal hysterectomy w/ bilateral salpingoophorectomy 1995  . Ovarian cyst removal 1995  . Appendectomy     for obliteration of the appendiceal tip  . Tubal ligation 1989  . Breast biopsy  02/1992    biopsy of benign breast mass  . Total thyroidectomy 05/13/2004    Multiple thyroid nodules, dominant mass, left thyroid lobe -- by, Velora Heckler, M.D  . Esophagogastroduodenoscopy 12/06/2005    Normal esophagogastroduodenoscopy --  Danise Edge, M.D.    Family History  Problem Relation Age of Onset  . Heart disease Father   . Asthma Mother   . Diabetes Mother   . Heart disease      fathers side has a strong history of heart disease  . Hyperlipidemia      strong family history of  . Heart attack Father     History  Substance Use Topics  . Smoking status: Former Smoker -- 0.5 packs/day for 19 years    Types: Cigarettes    Quit date: 05/09/2011  . Smokeless tobacco: Never Used  . Alcohol Use: No    OB History    Grav Para Term Preterm Abortions TAB SAB Ect Mult Living                  Review of Systems  Constitutional: Positive for chills.  HENT: Positive for rhinorrhea.   Respiratory: Positive for cough.   Musculoskeletal: Positive for myalgias.  Neurological: Positive for headaches.  All other systems reviewed and are negative.    Physical Exam  BP 120/75  Pulse 84  Temp(Src) 101.7 F (38.7 C) (Oral)  Resp 16  Wt 125 lb (56.7 kg)  SpO2  97%  Physical Exam  Nursing note and vitals reviewed. Constitutional: She is oriented to person, place, and time. She appears well-developed and well-nourished.  HENT:  Head: Normocephalic and atraumatic.  Eyes: Conjunctivae and EOM are normal. Pupils are equal, round, and reactive to light.  Neck: Normal range of motion. Neck supple.  Cardiovascular: Normal rate.   Pulmonary/Chest: Effort normal.  Abdominal: Soft.  Musculoskeletal: Normal range of motion.  Neurological: She is alert and oriented to person, place, and time.  Skin: Skin is warm.  Psychiatric: She has a normal mood and affect.    ED Course  Procedures  MDM Pt has cough and fever,  Pt stopped smoking earlier this year.  Family member  treated here with antibiotics for bronchitis.      Langston Masker, [place] 08/31/11 2206

## 2011-09-01 NOTE — ED Provider Notes (Signed)
Medical screening examination/treatment/procedure(s) were performed by non-physician practitioner and as supervising physician I was immediately available for consultation/collaboration.   Samy Ryner, MD 09/01/11 0158 

## 2011-11-10 ENCOUNTER — Telehealth: Payer: Self-pay | Admitting: Cardiology

## 2011-11-10 NOTE — Telephone Encounter (Signed)
Error

## 2012-01-24 ENCOUNTER — Encounter: Payer: Self-pay | Admitting: Cardiovascular Disease

## 2012-01-25 ENCOUNTER — Encounter: Payer: Self-pay | Admitting: Cardiovascular Disease

## 2012-01-25 ENCOUNTER — Ambulatory Visit (INDEPENDENT_AMBULATORY_CARE_PROVIDER_SITE_OTHER): Payer: BC Managed Care – PPO | Admitting: Cardiovascular Disease

## 2012-01-25 ENCOUNTER — Ambulatory Visit: Payer: BC Managed Care – PPO | Admitting: Cardiovascular Disease

## 2012-01-25 DIAGNOSIS — E785 Hyperlipidemia, unspecified: Secondary | ICD-10-CM

## 2012-01-25 DIAGNOSIS — R0789 Other chest pain: Secondary | ICD-10-CM

## 2012-01-25 DIAGNOSIS — R079 Chest pain, unspecified: Secondary | ICD-10-CM

## 2012-01-25 DIAGNOSIS — E7800 Pure hypercholesterolemia, unspecified: Secondary | ICD-10-CM

## 2012-01-25 MED ORDER — ASPIRIN EC 81 MG PO TBEC: 81.0000 mg | DELAYED_RELEASE_TABLET | Freq: Every day | ORAL | Status: AC

## 2012-01-25 NOTE — Progress Notes (Signed)
HPI:  This is a 51 year old woman presenting for evaluation of chest pain. Her underlying cardiac risk factors include a strong family history of premature CAD, tobacco abuse, and hyperlipidemia. The patient was driving home from work a few days ago and developed substernal chest pain that she describes as a pressure across the center of her chest and into her back. The pain waxed and waned for a few hours. There was some radiation up to the neck and she felt a pounding sensation in the left neck. There was no associated dyspnea. There was associated diaphoresis and mild nausea. She also had a tingling sensation around her lips. She has not had a recurrence of pain but has felt generally fatigued and reports frequent bowel movements over the past few days. She had a stress test back in 2008 which showed no ischemia but has not had an ischemic evaluation since that time. The patient had a hysterectomy about 10 years ago. She has a very strong family history of coronary disease as her father had his first MI at age 42. Many of the lamina and the family have had coronary disease (mainly on the paternal side).  Outpatient Encounter Prescriptions as of 01/25/2012  Medication Sig Dispense Refill  . Acetaminophen-Caffeine (EXCEDRIN TENSION HEADACHE PO) Take 1-2 tablets by mouth as needed. headache      . calcium carbonate (OS-CAL) 600 MG TABS Take 600 mg by mouth 2 (two) times daily with a meal.        . estradiol (ESTRACE) 2 MG tablet Take 2 mg by mouth Daily.       Marland Kitchen NITROSTAT 0.4 MG SL tablet Place 0.4 mg under the tongue.       Marland Kitchen omeprazole (PRILOSEC) 40 MG capsule Take 40 mg by mouth Daily.       . simvastatin (ZOCOR) 80 MG tablet Take 80 mg by mouth at bedtime.       Marland Kitchen SYNTHROID 100 MCG tablet Take 100 mcg by mouth Daily.       . Tetrahydrozoline HCl (VISINE OP) Place 2 drops into both eyes daily.        Marland Kitchen DISCONTD: butalbital-acetaminophen-caffeine (FIORICET WITH CODEINE) 50-325-40-30 MG per capsule Take  1-2 capsules by mouth as needed. headache        Allergies  Allergen Reactions  . Phenergan Anaphylaxis and Hives    Past Medical History  Diagnosis Date  . 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 This was associated with  Subsequent to that time she has had repeat laparoscopies for chronic, particularly right sided, pelvic pain on two occasions, one in 2000 and again in June of 2003.  At each time, pelvic adhesions were noted but no other significant pelvic pathology  . Ulcer disease     currently asymptomatic and not on medication  . Other and unspecified ovarian cysts     long history of recurring ovarian cysts  . Hyperlipidemia   . Thyroid disease   . Right bundle branch block   . Hypercholesterolemia   . Tobacco abuse   . 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 Hypothyroidism   . GERD (gastroesophageal reflux disease)     ROS: positive for frequent loose stools and generalized fatigue, otherwise negative except as per HPI  BP 112/74  Pulse 80  Ht 5\' 3"  (1.6 m)  Wt  58.968 kg (130 lb)  BMI 23.03 kg/m2  PHYSICAL EXAM: Pt is alert and oriented, NAD HEENT: normal Neck: JVP - normal, carotids 2+= without bruits Lungs: CTA bilaterally CV: RRR without murmur or gallop Abd: soft, NT, Positive BS, no hepatomegaly Ext: no C/C/E, distal pulses intact and equal Skin: warm/dry no rash  EKG:  Normal sinus rhythm 69 beats per minute, incomplete right bundle branch block unchanged from previous tracings, otherwise within normal limits.  ASSESSMENT AND PLAN:

## 2012-01-25 NOTE — Assessment & Plan Note (Signed)
The patient has chest pain with typical and atypical features. It is concerning that her pain occurred at rest. I've asked her to start an aspirin 81 mg daily. She will be scheduled for an exercise Myoview stress test and we will try to arrange this as soon as possible. We discussed the importance of immediate medical attention if she has recurrent pain that persists more than 5 minutes. If she does have recurrent chest pain she will try a sublingual nitroglycerin and if she does not have full resolution of chest pain she will activate EMS.

## 2012-01-25 NOTE — Patient Instructions (Addendum)
Your physician wants you to follow-up in: 6 months with Norma Fredrickson, NP. You will receive a reminder letter in the mail two months in advance. If you don't receive a letter, please call our office to schedule the follow-up appointment.    Your physician has requested that you have an exercise stress myoview. For further information please visit [email or web address] Please follow instruction sheet, as given.  Your physician has recommended you make the following change in your medication:Start aspirin 81 mg by mouth daily.  Your physician recommends that you return for fasting lab work on day of stress test--Lipid and Liver profile

## 2012-01-25 NOTE — Assessment & Plan Note (Signed)
The patient is on simvastatin. She will be scheduled for lipids and LFTs when she returns for her stress test.

## 2012-01-26 ENCOUNTER — Other Ambulatory Visit (INDEPENDENT_AMBULATORY_CARE_PROVIDER_SITE_OTHER): Payer: BC Managed Care – PPO | Admitting: *Deleted

## 2012-01-26 ENCOUNTER — Ambulatory Visit (HOSPITAL_COMMUNITY): Payer: BC Managed Care – PPO | Attending: Cardiovascular Disease | Admitting: Radiology

## 2012-01-26 DIAGNOSIS — R110 Nausea: Secondary | ICD-10-CM | POA: Insufficient documentation

## 2012-01-26 DIAGNOSIS — I4510 Unspecified right bundle-branch block: Secondary | ICD-10-CM | POA: Insufficient documentation

## 2012-01-26 DIAGNOSIS — R42 Dizziness and giddiness: Secondary | ICD-10-CM | POA: Insufficient documentation

## 2012-01-26 DIAGNOSIS — R61 Generalized hyperhidrosis: Secondary | ICD-10-CM | POA: Insufficient documentation

## 2012-01-26 DIAGNOSIS — R000 Tachycardia, unspecified: Secondary | ICD-10-CM | POA: Insufficient documentation

## 2012-01-26 DIAGNOSIS — R5381 Other malaise: Secondary | ICD-10-CM | POA: Insufficient documentation

## 2012-01-26 DIAGNOSIS — F17200 Nicotine dependence, unspecified, uncomplicated: Secondary | ICD-10-CM | POA: Insufficient documentation

## 2012-01-26 DIAGNOSIS — E7800 Pure hypercholesterolemia, unspecified: Secondary | ICD-10-CM

## 2012-01-26 DIAGNOSIS — R079 Chest pain, unspecified: Secondary | ICD-10-CM | POA: Insufficient documentation

## 2012-01-26 DIAGNOSIS — R0602 Shortness of breath: Secondary | ICD-10-CM

## 2012-01-26 DIAGNOSIS — E785 Hyperlipidemia, unspecified: Secondary | ICD-10-CM | POA: Insufficient documentation

## 2012-01-26 DIAGNOSIS — Z8249 Family history of ischemic heart disease and other diseases of the circulatory system: Secondary | ICD-10-CM | POA: Insufficient documentation

## 2012-01-26 DIAGNOSIS — R5383 Other fatigue: Secondary | ICD-10-CM | POA: Insufficient documentation

## 2012-01-26 DIAGNOSIS — R002 Palpitations: Secondary | ICD-10-CM | POA: Insufficient documentation

## 2012-01-26 LAB — LIPID PANEL
Cholesterol: 112 mg/dL (ref 0–200)
HDL: 40.8 mg/dL (ref >39.00)
LDL Cholesterol: 53 mg/dL (ref 0–99)
Total CHOL/HDL Ratio: 3
Triglycerides: 91 mg/dL (ref 0.0–149.0)
VLDL: 18.2 mg/dL (ref 0.0–40.0)

## 2012-01-26 LAB — HEPATIC FUNCTION PANEL
ALT: 39 U/L — ABNORMAL HIGH (ref 0–35)
AST: 32 U/L (ref 0–37)
Albumin: 3.3 g/dL — ABNORMAL LOW (ref 3.5–5.2)
Alkaline Phosphatase: 43 U/L (ref 39–117)
Bilirubin, Direct: 0 mg/dL (ref 0.0–0.3)
Total Bilirubin: 0.2 mg/dL — ABNORMAL LOW (ref 0.3–1.2)
Total Protein: 6 g/dL (ref 6.0–8.3)

## 2012-01-26 MED ORDER — TECHNETIUM TC 99M TETROFOSMIN IV KIT
10.0000 | PACK | Freq: Once | INTRAVENOUS | Status: AC | PRN
  Administered 2012-01-26: 10 via INTRAVENOUS

## 2012-01-26 MED ORDER — TECHNETIUM TC 99M TETROFOSMIN IV KIT
30.0000 | PACK | Freq: Once | INTRAVENOUS | Status: AC | PRN
  Administered 2012-01-26: 30 via INTRAVENOUS

## 2012-01-26 NOTE — Progress Notes (Signed)
[facility] SITE 3 NUCLEAR MED [address] [place] [address] [phone]  Cardiology Nuclear Med Study  Christina Watts is a 51 y.o. female [id number] [birth year]   Nuclear Med Background Indication for Stress Test:  Evaluation for Ischemia History:  '08 [id number] per dictation (no report) Cardiac Risk Factors: Family History - CAD, Lipids, RBBB and Smoker  Symptoms:  Chest Pain/Pressure.  (last episode of chest discomfort was Monday, jaw ache was yesterday), Diaphoresis, Dizziness, Fatigue, Nausea, Palpitations, Rapid HR and SOB   Nuclear Pre-Procedure Caffeine/Decaff Intake:  None NPO After: 7:00pm   Lungs:  Clear. IV 0.9% NS with Angio Cath:  20g  IV Site: R Antecubital  IV Started by:  Stanton Kidney, EMT-P  Chest Size (in):  36 Cup Size: C  Height: 5\' 2"  (1.575 m)  Weight:  128 lb (58.06 kg)  BMI:  Body mass index is 23.41 kg/(m^2). Tech Comments:  NA    Nuclear Med Study 1 or 2 day study: 1 day  Stress Test Type:  Stress  Reading MD: Charlton Haws, MD  Order Authorizing Provider:  [name], MD  Resting Radionuclide: Technetium 11m Tetrofosmin  Resting Radionuclide Dose: 11.0 mCi   Stress Radionuclide:  Technetium 60m Tetrofosmin  Stress Radionuclide Dose: 33.0 mCi           Stress Protocol Rest HR: 64 Stress HR: 155  Rest BP: Sitting 106/64  Standing 105/74 Stress BP: 142/78  Exercise Time (min): 7:46 METS: 9.7   Predicted Max HR: 169 bpm % Max HR: 91.72 bpm Rate Pressure Product: 03474   Dose of Adenosine (mg):  n/a Dose of Lexiscan: n/a mg  Dose of Atropine (mg): n/a Dose of Dobutamine: n/a mcg/kg/min (at max HR)  Stress Test Technologist: Smiley Houseman, CMA-N  Nuclear Technologist:  Domenic Polite, CNMT     Rest Procedure:  Myocardial perfusion imaging was performed at rest 45 minutes following the intravenous administration of Technetium 51m Tetrofosmin.  Rest ECG: No Acute changes.  Stress Procedure:  The patient  exercised for 7:46 on the treadmill utilizing the Bruce protocol.  The patient stopped due to fatigue and (R) leg pain.  She denied any chest pain during exercise, but did c/o chest tightness 1-minute post exercise, 7/10; relieved to 3/10 5-minutes post exercise.  No chest tightness after stress images.  There were no diagnostic ST-T wave changes.  Technetium 74m Tetrofosmin was injected at peak exercise and myocardial perfusion imaging was performed after a brief delay.  Stress ECG: No significant change from baseline ECG  QPS Raw Data Images:  Normal; no motion artifact; normal heart/lung ratio. Stress Images:  Normal homogeneous uptake in all areas of the myocardium. Rest Images:  Normal homogeneous uptake in all areas of the myocardium. Subtraction (SDS):  Normal Transient Ischemic Dilatation (Normal <1.22):  1.16 Lung/Heart Ratio (Normal <0.45):  0.29  Quantitative Gated Spect Images QGS EDV:  76 ml QGS ESV:  31 ml QGS cine images:  NL LV Function; NL Wall Motion QGS EF: 58%  Impression Exercise Capacity:  Fair exercise capacity. BP Response:  Normal blood pressure response. Clinical Symptoms:  Mild chest pain/dyspnea. ECG Impression:  No significant ST segment change suggestive of ischemia. Comparison with Prior Nuclear Study: No previous nuclear study performed.  Overall Impression:  Normal stress nuclear study. Patient did have chest pain with exercise   Charlton Haws

## 2012-01-29 ENCOUNTER — Other Ambulatory Visit: Payer: Self-pay | Admitting: Cardiology

## 2012-01-30 ENCOUNTER — Telehealth: Payer: Self-pay | Admitting: Cardiovascular Disease

## 2012-01-30 DIAGNOSIS — Z79899 Other long term (current) drug therapy: Secondary | ICD-10-CM

## 2012-01-30 DIAGNOSIS — E7800 Pure hypercholesterolemia, unspecified: Secondary | ICD-10-CM

## 2012-01-30 MED ORDER — SIMVASTATIN 80 MG PO TABS: 80.0000 mg | ORAL_TABLET | Freq: Every day | ORAL | Status: DC

## 2012-01-30 NOTE — Telephone Encounter (Signed)
Pt aware of lab and stress test results and recommendations. Patient has an appointment for LFT on May 2 Nd 2013, Patient aware.

## 2012-01-30 NOTE — Telephone Encounter (Signed)
New problem Pt wants to know test results. She said you can leave message.

## 2012-01-31 ENCOUNTER — Telehealth: Payer: Self-pay | Admitting: Cardiovascular Disease

## 2012-01-31 NOTE — Telephone Encounter (Signed)
Left message to call back  

## 2012-01-31 NOTE — Telephone Encounter (Signed)
New Msg: Pt calling wanting to speak with nurse regarding pt lab results. Please return pt call to discuss further.

## 2012-01-31 NOTE — Telephone Encounter (Signed)
I spoke with the pt and made her aware of lab and myoview results.

## 2012-02-07 ENCOUNTER — Ambulatory Visit: Payer: BC Managed Care – PPO | Admitting: Cardiology

## 2012-04-26 ENCOUNTER — Other Ambulatory Visit (INDEPENDENT_AMBULATORY_CARE_PROVIDER_SITE_OTHER): Payer: BC Managed Care – PPO

## 2012-04-26 DIAGNOSIS — Z79899 Other long term (current) drug therapy: Secondary | ICD-10-CM

## 2012-04-26 DIAGNOSIS — E7800 Pure hypercholesterolemia, unspecified: Secondary | ICD-10-CM

## 2012-04-27 LAB — HEPATIC FUNCTION PANEL
ALT: 18 U/L (ref 0–35)
AST: 23 U/L (ref 0–37)
Albumin: 3.6 g/dL (ref 3.5–5.2)
Alkaline Phosphatase: 43 U/L (ref 39–117)
Bilirubin, Direct: 0 mg/dL (ref 0.0–0.3)
Total Bilirubin: 0.4 mg/dL (ref 0.3–1.2)
Total Protein: 6.5 g/dL (ref 6.0–8.3)

## 2012-06-04 ENCOUNTER — Encounter (HOSPITAL_BASED_OUTPATIENT_CLINIC_OR_DEPARTMENT_OTHER): Payer: Self-pay | Admitting: *Deleted

## 2012-06-04 ENCOUNTER — Emergency Department (HOSPITAL_BASED_OUTPATIENT_CLINIC_OR_DEPARTMENT_OTHER)
Admission: EM | Admit: 2012-06-04 | Discharge: 2012-06-04 | Disposition: A | Discharge status: Home / Self Care | Payer: BC Managed Care – PPO | Attending: Emergency Medicine | Admitting: Emergency Medicine

## 2012-06-04 DIAGNOSIS — E079 Disorder of thyroid, unspecified: Secondary | ICD-10-CM | POA: Insufficient documentation

## 2012-06-04 DIAGNOSIS — E039 Hypothyroidism, unspecified: Secondary | ICD-10-CM | POA: Insufficient documentation

## 2012-06-04 DIAGNOSIS — Z79899 Other long term (current) drug therapy: Secondary | ICD-10-CM | POA: Insufficient documentation

## 2012-06-04 DIAGNOSIS — E7800 Pure hypercholesterolemia, unspecified: Secondary | ICD-10-CM | POA: Insufficient documentation

## 2012-06-04 DIAGNOSIS — Z87891 Personal history of nicotine dependence: Secondary | ICD-10-CM | POA: Insufficient documentation

## 2012-06-04 DIAGNOSIS — E785 Hyperlipidemia, unspecified: Secondary | ICD-10-CM | POA: Insufficient documentation

## 2012-06-04 DIAGNOSIS — K219 Gastro-esophageal reflux disease without esophagitis: Secondary | ICD-10-CM | POA: Insufficient documentation

## 2012-06-04 DIAGNOSIS — J40 Bronchitis, not specified as acute or chronic: Secondary | ICD-10-CM | POA: Insufficient documentation

## 2012-06-04 MED ORDER — AZITHROMYCIN 250 MG PO TABS: ORAL_TABLET | ORAL | Status: DC

## 2012-06-04 MED ORDER — BENZONATATE 100 MG PO CAPS: 100.0000 mg | ORAL_CAPSULE | Freq: Three times a day (TID) | ORAL | Status: AC

## 2012-06-04 NOTE — Discharge Instructions (Signed)
Bronchitis Bronchitis is the body's way of reacting to injury and/or infection (inflammation) of the bronchi. Bronchi are the air tubes that extend from the windpipe into the lungs. If the inflammation becomes severe, it may cause shortness of breath. CAUSES  Inflammation may be caused by:  A virus.   Germs (bacteria).   Dust.   Allergens.   Pollutants and many other irritants.  The cells lining the bronchial tree are covered with tiny hairs (cilia). These constantly beat upward, away from the lungs, toward the mouth. This keeps the lungs free of pollutants. When these cells become too irritated and are unable to do their job, mucus begins to develop. This causes the characteristic cough of bronchitis. The cough clears the lungs when the cilia are unable to do their job. Without either of these protective mechanisms, the mucus would settle in the lungs. Then you would develop pneumonia. Smoking is a common cause of bronchitis and can contribute to pneumonia. Stopping this habit is the single most important thing you can do to help yourself. TREATMENT   Your caregiver may prescribe an antibiotic if the cough is caused by bacteria. Also, medicines that open up your airways make it easier to breathe. Your caregiver may also recommend or prescribe an expectorant. It will loosen the mucus to be coughed up. Only take over-the-counter or prescription medicines for pain, discomfort, or fever as directed by your caregiver.   Removing whatever causes the problem (smoking, for example) is critical to preventing the problem from getting worse.   Cough suppressants may be prescribed for relief of cough symptoms.   Inhaled medicines may be prescribed to help with symptoms now and to help prevent problems from returning.   For those with recurrent (chronic) bronchitis, there may be a need for steroid medicines.  SEEK IMMEDIATE MEDICAL CARE IF:   During treatment, you develop more pus-like mucus  (purulent sputum).   You have a fever.   Your baby is older than 3 months with a rectal temperature of 102 F (38.9 C) or higher.   Your baby is 3 months old or younger with a rectal temperature of 100.4 F (38 C) or higher.   You become progressively more ill.   You have increased difficulty breathing, wheezing, or shortness of breath.  It is necessary to seek immediate medical care if you are elderly or sick from any other disease. MAKE SURE YOU:   Understand these instructions.   Will watch your condition.   Will get help right away if you are not doing well or get worse.  Document Released: 12/12/2005 Document Revised: 12/01/2011 Document Reviewed: 10/21/2008 [employer] Patient Information 2012 [employer].Smoking Cessation This document explains the best ways for you to quit smoking and new treatments to help. It lists new medicines that can double or triple your chances of quitting and quitting for good. It also considers ways to avoid relapses and concerns you may have about quitting, including weight gain. NICOTINE: A POWERFUL ADDICTION If you have tried to quit smoking, you know how hard it can be. It is hard because nicotine is a very addictive drug. For some people, it can be as addictive as heroin or cocaine. Usually, people make 2 or 3 tries, or more, before finally being able to quit. Each time you try to quit, you can learn about what helps and what hurts. Quitting takes hard work and a lot of effort, but you can quit smoking. QUITTING SMOKING IS ONE OF THE MOST   IMPORTANT THINGS YOU WILL EVER DO.  You will live longer, feel better, and live better.   The impact on your body of quitting smoking is felt almost immediately:   Within 20 minutes, blood pressure decreases. Pulse returns to its normal level.   After 8 hours, carbon monoxide levels in the blood return to normal. Oxygen level increases.   After 24 hours, chance of heart attack starts to decrease. Breath,  hair, and body stop smelling like smoke.   After 48 hours, damaged nerve endings begin to recover. Sense of taste and smell improve.   After 72 hours, the body is virtually free of nicotine. Bronchial tubes relax and breathing becomes easier.   After 2 to 12 weeks, lungs can hold more air. Exercise becomes easier and circulation improves.   Quitting will reduce your risk of having a heart attack, stroke, cancer, or lung disease:   After 1 year, the risk of coronary heart disease is cut in half.   After 5 years, the risk of stroke falls to the same as a nonsmoker.   After 10 years, the risk of lung cancer is cut in half and the risk of other cancers decreases significantly.   After 15 years, the risk of coronary heart disease drops, usually to the level of a nonsmoker.   If you are pregnant, quitting smoking will improve your chances of having a healthy baby.   The people you live with, especially your children, will be healthier.   You will have extra money to spend on things other than cigarettes.  FIVE KEYS TO QUITTING Studies have shown that these 5 steps will help you quit smoking and quit for good. You have the best chances of quitting if you use them together: 1. Get ready.  2. Get support and encouragement.  3. Learn new skills and behaviors.  4. Get medicine to reduce your nicotine addiction and use it correctly.  5. Be prepared for relapse or difficult situations. Be determined to continue trying to quit, even if you do not succeed at first.  1. GET READY  Set a quit date.   Change your environment.   Get rid of ALL cigarettes, ashtrays, matches, and lighters in your home, car, and place of work.   Do not let people smoke in your home.   Review your past attempts to quit. Think about what worked and what did not.   Once you quit, do not smoke. NOT EVEN A PUFF!  2. GET SUPPORT AND ENCOURAGEMENT Studies have shown that you have a better chance of being successful if  you have help. You can get support in many ways.  Tell your family, friends, and coworkers that you are going to quit and need their support. Ask them not to smoke around you.   Talk to your caregivers (doctor, dentist, nurse, pharmacist, psychologist, and/or smoking counselor).   Get individual, group, or telephone counseling and support. The more counseling you have, the better your chances are of quitting. Programs are available at local hospitals and health centers. Call your local health department for information about programs in your area.   Spiritual beliefs and practices may help some smokers quit.   Quit meters are small computer programs online or downloadable that keep track of quit statistics, such as amount of "quit-time," cigarettes not smoked, and money saved.   Many smokers find one or more of the many self-help books available useful in helping them quit and stay off tobacco.    3. LEARN NEW SKILLS AND BEHAVIORS  Try to distract yourself from urges to smoke. Talk to someone, go for a walk, or occupy your time with a task.   When you first try to quit, change your routine. Take a different route to work. Drink tea instead of coffee. Eat breakfast in a different place.   Do something to reduce your stress. Take a hot bath, exercise, or read a book.   Plan something enjoyable to do every day. Reward yourself for not smoking.   Explore interactive web-based programs that specialize in helping you quit.  4. GET MEDICINE AND USE IT CORRECTLY Medicines can help you stop smoking and decrease the urge to smoke. Combining medicine with the above behavioral methods and support can quadruple your chances of successfully quitting smoking. The [place] Food and Drug Administration (FDA) has approved 7 medicines to help you quit smoking. These medicines fall into 3 categories.  Nicotine replacement therapy (delivers nicotine to your body without the negative effects and risks of smoking):     Nicotine gum: Available over-the-counter.   Nicotine lozenges: Available over-the-counter.   Nicotine inhaler: Available by prescription.   Nicotine nasal spray: Available by prescription.   Nicotine skin patches (transdermal): Available by prescription and over-the-counter.   Antidepressant medicine (helps people abstain from smoking, but how this works is unknown):   Bupropion sustained-release (SR) tablets: Available by prescription.   Nicotinic receptor partial agonist (simulates the effect of nicotine in your brain):   Varenicline tartrate tablets: Available by prescription.   Ask your caregiver for advice about which medicines to use and how to use them. Carefully read the information on the package.   Everyone who is trying to quit may benefit from using a medicine. If you are pregnant or trying to become pregnant, nursing an infant, you are under age 18, or you smoke fewer than 10 cigarettes per day, talk to your caregiver before taking any nicotine replacement medicines.   You should stop using a nicotine replacement product and call your caregiver if you experience nausea, dizziness, weakness, vomiting, fast or irregular heartbeat, mouth problems with the lozenge or gum, or redness or swelling of the skin around the patch that does not go away.   Do not use any other product containing nicotine while using a nicotine replacement product.   Talk to your caregiver before using these products if you have diabetes, heart disease, asthma, stomach ulcers, you had a recent heart attack, you have high blood pressure that is not controlled with medicine, a history of irregular heartbeat, or you have been prescribed medicine to help you quit smoking.  5. BE PREPARED FOR RELAPSE OR DIFFICULT SITUATIONS  Most relapses occur within the first 3 months after quitting. Do not be discouraged if you start smoking again. Remember, most people try several times before they finally quit.    You may have symptoms of withdrawal because your body is used to nicotine. You may crave cigarettes, be irritable, feel very hungry, cough often, get headaches, or have difficulty concentrating.   The withdrawal symptoms are only temporary. They are strongest when you first quit, but they will go away within 10 to 14 days.  Here are some difficult situations to watch for:  Alcohol. Avoid drinking alcohol. Drinking lowers your chances of successfully quitting.   Caffeine. Try to reduce the amount of caffeine you consume. It also lowers your chances of successfully quitting.   Other smokers. Being around smoking can make you want   to smoke. Avoid smokers.   Weight gain. Many smokers will gain weight when they quit, usually less than 10 pounds. Eat a healthy diet and stay active. Do not let weight gain distract you from your main goal, quitting smoking. Some medicines that help you quit smoking may also help delay weight gain. You can always lose the weight gained after you quit.   Bad mood or depression. There are a lot of ways to improve your mood other than smoking.  If you are having problems with any of these situations, talk to your caregiver. SPECIAL SITUATIONS AND CONDITIONS Studies suggest that everyone can quit smoking. Your situation or condition can give you a special reason to quit.  Pregnant women/new mothers: By quitting, you protect your baby's health and your own.   Hospitalized patients: By quitting, you reduce health problems and help healing.   Heart attack patients: By quitting, you reduce your risk of a second heart attack.   Lung, head, and neck cancer patients: By quitting, you reduce your chance of a second cancer.   Parents of children and adolescents: By quitting, you protect your children from illnesses caused by secondhand smoke.  QUESTIONS TO THINK ABOUT Think about the following questions before you try to stop smoking. You may want to talk about your  answers with your caregiver.  Why do you want to quit?   If you tried to quit in the past, what helped and what did not?   What will be the most difficult situations for you after you quit? How will you plan to handle them?   Who can help you through the tough times? Your family? Friends? Caregiver?   What pleasures do you get from smoking? What ways can you still get pleasure if you quit?  Here are some questions to ask your caregiver:  How can you help me to be successful at quitting?   What medicine do you think would be best for me and how should I take it?   What should I do if I need more help?   What is smoking withdrawal like? How can I get information on withdrawal?  Quitting takes hard work and a lot of effort, but you can quit smoking. FOR MORE INFORMATION  Smokefree.gov (http://www.smokefree.gov) provides free, accurate, evidence-based information and professional assistance to help support the immediate and long-term needs of people trying to quit smoking. Document Released: 12/06/2001 Document Revised: 12/01/2011 Document Reviewed: 09/28/2009 [employer] Patient Information 2012 [employer]. 

## 2012-06-04 NOTE — ED Provider Notes (Signed)
History     CSN: [id number]  Arrival date & time 06/04/12  7829   First MD Initiated Contact with Patient 06/04/12 1012      Chief Complaint  Patient presents with  . Cough  . Eye Pain    (Consider location/radiation/quality/duration/timing/severity/associated sxs/prior treatment) HPI Comments: Patient began having cough over the last 2 days.  She notes she has some green sputum production.  The cough has kept her up at night.  She's had no fevers, shortness of breath, chest pain or nausea.  Patient is concerned because she has to fly to [place] and does not want to be sick.  She's also noted that her right eye was slightly swollen red and was crusted shut this morning.  She wears contacts intermittently that her daily use contacts.  She has not had them in the last few days.  Patient has no sore throat, sinus pressure or other URI symptoms.  Patient is a 51 y.o. female presenting with cough and eye pain. The history is provided by the patient. No language interpreter was used.  Cough This is a new problem. Associated symptoms include eye redness. Pertinent negatives include no chest pain, no chills, no headaches and no shortness of breath.  Eye Pain Pertinent negatives include no chest pain, no abdominal pain, no headaches and no shortness of breath.    Past Medical History  Diagnosis Date  . 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 This was associated with  Subsequent to that time she has had repeat laparoscopies for chronic, particularly right sided, pelvic pain on two occasions, one in 2000 and again in June of 2003.  At each time, pelvic adhesions were noted but no other significant pelvic pathology  . Ulcer disease     currently asymptomatic and not on medication  . Other and unspecified ovarian cysts     long history of recurring  ovarian cysts  . Hyperlipidemia   . Thyroid disease   . Right bundle branch block   . Hypercholesterolemia   . Tobacco abuse   . 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 Hypothyroidism   . GERD (gastroesophageal reflux disease)     Past Surgical History  Procedure Date  . Total abdominal hysterectomy w/ bilateral salpingoophorectomy 1995  . Ovarian cyst removal 1995  . Appendectomy     for obliteration of the appendiceal tip  . Tubal ligation 1989  . Breast biopsy 02/1992    biopsy of benign breast mass  . Total thyroidectomy 05/13/2004    Multiple thyroid nodules, dominant mass, left thyroid lobe -- by, Velora Heckler, M.D  . Esophagogastroduodenoscopy 12/06/2005    Normal esophagogastroduodenoscopy --  Danise Edge, M.D.    Family History  Problem Relation Age of Onset  . Heart disease Father   . Asthma Mother   . Diabetes Mother   . Heart disease      fathers side has a strong history of heart disease  . Hyperlipidemia      strong family history of  . Heart attack Father     History  Substance Use Topics  . Smoking status: Former Smoker -- 0.5 packs/day for 19 years    Types: Cigarettes    Quit date: 05/09/2011  . Smokeless tobacco: Never Used  . Alcohol Use: No    OB History  Grav Para Term Preterm Abortions TAB SAB Ect Mult Living                  Review of Systems  Constitutional: Negative.  Negative for fever and chills.  HENT: Negative.   Eyes: Positive for pain, discharge and redness.  Respiratory: Positive for cough. Negative for shortness of breath.   Cardiovascular: Negative.  Negative for chest pain.  Gastrointestinal: Negative.  Negative for nausea, vomiting, abdominal pain and diarrhea.  Genitourinary: Negative.   Musculoskeletal: Negative.  Negative for back pain.  Skin: Negative.  Negative for color change and rash.  Neurological: Negative.  Negative for headaches.  Hematological: Negative.  Negative for adenopathy.    Psychiatric/Behavioral: Negative.  Negative for confusion.  All other systems reviewed and are negative.    Allergies  Promethazine hcl  Home Medications   Current Outpatient Rx  Name Route Sig Dispense Refill  . EXCEDRIN TENSION HEADACHE PO Oral Take 1-2 tablets by mouth as needed. headache    . ASPIRIN EC 81 MG PO TBEC Oral Take 1 tablet (81 mg total) by mouth daily. 150 tablet 2  . AZITHROMYCIN 250 MG PO TABS  Take 2 tabs the first day and 1 tab each day thereafter until completed. 6 tablet 0  . BENZONATATE 100 MG PO CAPS Oral Take 1 capsule (100 mg total) by mouth every 8 (eight) hours. 21 capsule 0  . CALCIUM CARBONATE 600 MG PO TABS Oral Take 600 mg by mouth 2 (two) times daily with a meal.      . ESTRADIOL 2 MG PO TABS Oral Take 2 mg by mouth Daily.     Marland Kitchen NITROSTAT 0.4 MG SL SUBL Sublingual Place 0.4 mg under the tongue.     Marland Kitchen OMEPRAZOLE 40 MG PO CPDR Oral Take 40 mg by mouth Daily.     Marland Kitchen SIMVASTATIN 80 MG PO TABS Oral Take 1 tablet (80 mg total) by mouth at bedtime. 30 tablet 8  . SYNTHROID 100 MCG PO TABS Oral Take 100 mcg by mouth Daily.     Marland Kitchen VISINE OP Both Eyes Place 2 drops into both eyes daily.        BP 137/84  Pulse 86  Temp(Src) 98.7 F (37.1 C) (Oral)  Resp 20  SpO2 99%  Physical Exam  Nursing note and vitals reviewed. Constitutional: She is oriented to person, place, and time. She appears well-developed and well-nourished.  Non-toxic appearance. She does not have a sickly appearance.  HENT:  Head: Normocephalic and atraumatic.  Mouth/Throat: Oropharynx is clear and moist. No oropharyngeal exudate.       Right eyelid shows minimal swelling.  No significant conjunctival injection.  No drainage at this time.  Pupils are equal round reactive to light.  Extraocular eye movements are intact.  Eyes: Conjunctivae, EOM and lids are normal. Pupils are equal, round, and reactive to light. No scleral icterus.  Neck: Trachea normal and normal range of motion. Neck  supple.  Cardiovascular: Normal rate, regular rhythm and normal heart sounds.  Exam reveals no gallop.   No murmur heard. Pulmonary/Chest: Effort normal and breath sounds normal. No respiratory distress. She has no wheezes. She has no rales.  Abdominal: Soft. Normal appearance. There is no tenderness. There is no rebound, no guarding and no CVA tenderness.  Musculoskeletal: Normal range of motion.  Lymphadenopathy:    She has no cervical adenopathy.  Neurological: She is alert and oriented to person, place, and time. She has normal strength.  Skin: Skin is warm, dry and intact. No rash noted.  Psychiatric: She has a normal mood and affect. Her behavior is normal. Judgment and thought content normal.    ED Course  Procedures (including critical care time)  Labs Reviewed - No data to display No results found.   1. Bronchitis       MDM  Patient with likely bronchitis giving her clear lungs but cough with green sputum.  Patient is a smoker so will place her on a short course of antibiotics.  I've advised her that she may find that even after she completes the antibiotics were continued cough and sputum production because his knee be viral and her smoking will prolong her course.  Her symptoms for her either likely a viral conjunctivitis I do not feel warrant any antibiotic treatment at this time.  Patient does not have contacts at this time but I've advised her that she may want to refrain from wearing her contacts until this completely clears.  Patient to followup with her primary care physician as needed.  Patient was given smoking cessation counseling.        Nat Christen, MD 06/04/12 1022

## 2012-06-04 NOTE — ED Notes (Signed)
Right eye irritated itchy and red onset last night also started having a cough last night with some green sputum no fever no nausea vomiting or diarrhea is scheduled to fly to [place] on wed wants to be sure she doesn't need antibiotics

## 2013-02-10 ENCOUNTER — Other Ambulatory Visit: Payer: Self-pay | Admitting: Cardiovascular Disease

## 2013-03-04 ENCOUNTER — Other Ambulatory Visit: Payer: Self-pay | Admitting: Gastroenterology

## 2013-03-04 DIAGNOSIS — R1032 Left lower quadrant pain: Secondary | ICD-10-CM

## 2013-03-05 ENCOUNTER — Ambulatory Visit
Admission: RE | Admit: 2013-03-05 | Discharge: 2013-03-05 | Disposition: A | Discharge status: Home / Self Care | Payer: BC Managed Care – PPO | Source: Ambulatory Visit | Attending: Gastroenterology | Admitting: Gastroenterology

## 2013-03-05 DIAGNOSIS — R1032 Left lower quadrant pain: Secondary | ICD-10-CM

## 2013-03-05 MED ORDER — IOHEXOL 300 MG/ML  SOLN
100.0000 mL | Freq: Once | INTRAMUSCULAR | Status: AC | PRN
  Administered 2013-03-05: 100 mL via INTRAVENOUS

## 2013-09-15 ENCOUNTER — Emergency Department (HOSPITAL_BASED_OUTPATIENT_CLINIC_OR_DEPARTMENT_OTHER)
Admission: EM | Admit: 2013-09-15 | Discharge: 2013-09-15 | Disposition: A | Discharge status: Home / Self Care | Payer: BC Managed Care – PPO | Attending: Emergency Medicine | Admitting: Emergency Medicine

## 2013-09-15 ENCOUNTER — Encounter (HOSPITAL_BASED_OUTPATIENT_CLINIC_OR_DEPARTMENT_OTHER): Payer: Self-pay | Admitting: *Deleted

## 2013-09-15 ENCOUNTER — Emergency Department (HOSPITAL_BASED_OUTPATIENT_CLINIC_OR_DEPARTMENT_OTHER): Payer: BC Managed Care – PPO

## 2013-09-15 DIAGNOSIS — Z79899 Other long term (current) drug therapy: Secondary | ICD-10-CM | POA: Insufficient documentation

## 2013-09-15 DIAGNOSIS — S82899A Other fracture of unspecified lower leg, initial encounter for closed fracture: Secondary | ICD-10-CM | POA: Insufficient documentation

## 2013-09-15 DIAGNOSIS — Z872 Personal history of diseases of the skin and subcutaneous tissue: Secondary | ICD-10-CM | POA: Insufficient documentation

## 2013-09-15 DIAGNOSIS — E785 Hyperlipidemia, unspecified: Secondary | ICD-10-CM | POA: Insufficient documentation

## 2013-09-15 DIAGNOSIS — Y9389 Activity, other specified: Secondary | ICD-10-CM | POA: Insufficient documentation

## 2013-09-15 DIAGNOSIS — Z8711 Personal history of peptic ulcer disease: Secondary | ICD-10-CM | POA: Insufficient documentation

## 2013-09-15 DIAGNOSIS — Y92410 Unspecified street and highway as the place of occurrence of the external cause: Secondary | ICD-10-CM | POA: Insufficient documentation

## 2013-09-15 DIAGNOSIS — Z8742 Personal history of other diseases of the female genital tract: Secondary | ICD-10-CM | POA: Insufficient documentation

## 2013-09-15 DIAGNOSIS — E039 Hypothyroidism, unspecified: Secondary | ICD-10-CM | POA: Insufficient documentation

## 2013-09-15 DIAGNOSIS — Z87891 Personal history of nicotine dependence: Secondary | ICD-10-CM | POA: Insufficient documentation

## 2013-09-15 DIAGNOSIS — N83209 Unspecified ovarian cyst, unspecified side: Secondary | ICD-10-CM | POA: Insufficient documentation

## 2013-09-15 DIAGNOSIS — Z8679 Personal history of other diseases of the circulatory system: Secondary | ICD-10-CM | POA: Insufficient documentation

## 2013-09-15 DIAGNOSIS — X500XXA Overexertion from strenuous movement or load, initial encounter: Secondary | ICD-10-CM | POA: Insufficient documentation

## 2013-09-15 DIAGNOSIS — S82402A Unspecified fracture of shaft of left fibula, initial encounter for closed fracture: Secondary | ICD-10-CM

## 2013-09-15 DIAGNOSIS — E7800 Pure hypercholesterolemia, unspecified: Secondary | ICD-10-CM | POA: Insufficient documentation

## 2013-09-15 DIAGNOSIS — K219 Gastro-esophageal reflux disease without esophagitis: Secondary | ICD-10-CM | POA: Insufficient documentation

## 2013-09-15 MED ORDER — OXYCODONE-ACETAMINOPHEN 5-325 MG PO TABS: 1.0000 | ORAL_TABLET | ORAL | Status: DC | PRN
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
## 2013-11-28 ENCOUNTER — Other Ambulatory Visit: Payer: Self-pay | Admitting: Orthopedic Surgery

## 2013-11-28 DIAGNOSIS — M25572 Pain in left ankle and joints of left foot: Secondary | ICD-10-CM

## 2013-12-04 ENCOUNTER — Ambulatory Visit
Admission: RE | Admit: 2013-12-04 | Discharge: 2013-12-04 | Disposition: A | Discharge status: Home / Self Care | Payer: No Typology Code available for payment source | Source: Ambulatory Visit | Attending: Orthopedic Surgery | Admitting: Orthopedic Surgery

## 2013-12-04 DIAGNOSIS — M25572 Pain in left ankle and joints of left foot: Secondary | ICD-10-CM

## 2014-02-05 ENCOUNTER — Other Ambulatory Visit: Payer: Self-pay

## 2014-02-05 MED ORDER — SIMVASTATIN 80 MG PO TABS: ORAL_TABLET | ORAL | Status: DC

## 2014-04-16 ENCOUNTER — Ambulatory Visit (INDEPENDENT_AMBULATORY_CARE_PROVIDER_SITE_OTHER): Payer: BC Managed Care – PPO | Admitting: Cardiovascular Disease

## 2014-04-16 ENCOUNTER — Encounter: Payer: Self-pay | Admitting: Cardiovascular Disease

## 2014-04-16 VITALS — BP 126/77 | HR 71 | Ht 62.0 in | Wt 126.0 lb

## 2014-04-16 DIAGNOSIS — F17200 Nicotine dependence, unspecified, uncomplicated: Secondary | ICD-10-CM

## 2014-04-16 DIAGNOSIS — Z720 Tobacco use: Secondary | ICD-10-CM

## 2014-04-16 DIAGNOSIS — E785 Hyperlipidemia, unspecified: Secondary | ICD-10-CM

## 2014-04-16 MED ORDER — VARENICLINE TARTRATE 1 MG PO TABS
1.0000 mg | ORAL_TABLET | Freq: Two times a day (BID) | ORAL | Status: DC
Start: 2014-04-16 — End: 2015-04-20

## 2014-04-16 MED ORDER — VARENICLINE TARTRATE 0.5 MG X 11 & 1 MG X 42 PO MISC: ORAL | Status: DC

## 2014-04-16 NOTE — Patient Instructions (Addendum)
Your physician recommends that you return for a FASTING Lipid and Liver profile --nothing to eat or drink after midnight, lab opens at 7:30 AM  Your physician wants you to follow-up in: 1 YEAR with Dr Burt Knack.  You will receive a reminder letter in the mail two months in advance. If you don't receive a letter, please call our office to schedule the follow-up appointment.  Prescriptions given to the patient for Chantix.
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
## 2014-04-17 ENCOUNTER — Other Ambulatory Visit: Payer: BC Managed Care – PPO

## 2014-04-18 ENCOUNTER — Other Ambulatory Visit (INDEPENDENT_AMBULATORY_CARE_PROVIDER_SITE_OTHER): Payer: BC Managed Care – PPO

## 2014-04-18 DIAGNOSIS — F17200 Nicotine dependence, unspecified, uncomplicated: Secondary | ICD-10-CM

## 2014-04-18 DIAGNOSIS — Z720 Tobacco use: Secondary | ICD-10-CM

## 2014-04-18 DIAGNOSIS — E785 Hyperlipidemia, unspecified: Secondary | ICD-10-CM

## 2014-04-18 LAB — LIPID PANEL
Cholesterol: 182 mg/dL (ref 0–200)
HDL: 66.2 mg/dL (ref >39.00)
LDL Cholesterol: 90 mg/dL (ref 0–99)
Total CHOL/HDL Ratio: 3
Triglycerides: 128 mg/dL (ref 0.0–149.0)
VLDL: 25.6 mg/dL (ref 0.0–40.0)

## 2014-04-18 LAB — HEPATIC FUNCTION PANEL
ALT: 15 U/L (ref 0–35)
AST: 19 U/L (ref 0–37)
Albumin: 3.4 g/dL — ABNORMAL LOW (ref 3.5–5.2)
Alkaline Phosphatase: 36 U/L — ABNORMAL LOW (ref 39–117)
Bilirubin, Direct: 0 mg/dL (ref 0.0–0.3)
Total Bilirubin: 0.4 mg/dL (ref 0.3–1.2)
Total Protein: 6.5 g/dL (ref 6.0–8.3)

## 2014-04-22 ENCOUNTER — Encounter: Payer: Self-pay | Admitting: Cardiovascular Disease

## 2014-04-22 NOTE — Telephone Encounter (Signed)
Patient is returning your call, please call and advise.

## 2014-04-22 NOTE — Telephone Encounter (Signed)
Left message on machine for pt to contact the office.   

## 2014-04-23 ENCOUNTER — Other Ambulatory Visit: Payer: Self-pay

## 2014-04-23 MED ORDER — SIMVASTATIN 80 MG PO TABS: ORAL_TABLET | ORAL | Status: DC

## 2014-04-23 NOTE — Telephone Encounter (Signed)
This encounter was created in error - please disregard.

## 2014-09-01 ENCOUNTER — Emergency Department (HOSPITAL_BASED_OUTPATIENT_CLINIC_OR_DEPARTMENT_OTHER)
Admission: EM | Admit: 2014-09-01 | Discharge: 2014-09-01 | Disposition: A | Discharge status: Home / Self Care | Payer: BC Managed Care – PPO | Attending: Emergency Medicine | Admitting: Emergency Medicine

## 2014-09-01 ENCOUNTER — Encounter (HOSPITAL_BASED_OUTPATIENT_CLINIC_OR_DEPARTMENT_OTHER): Payer: Self-pay | Admitting: Emergency Medicine

## 2014-09-01 ENCOUNTER — Emergency Department (HOSPITAL_BASED_OUTPATIENT_CLINIC_OR_DEPARTMENT_OTHER): Payer: BC Managed Care – PPO

## 2014-09-01 DIAGNOSIS — E7800 Pure hypercholesterolemia, unspecified: Secondary | ICD-10-CM | POA: Insufficient documentation

## 2014-09-01 DIAGNOSIS — Z79899 Other long term (current) drug therapy: Secondary | ICD-10-CM | POA: Diagnosis not present

## 2014-09-01 DIAGNOSIS — R059 Cough, unspecified: Secondary | ICD-10-CM | POA: Diagnosis not present

## 2014-09-01 DIAGNOSIS — R071 Chest pain on breathing: Secondary | ICD-10-CM | POA: Insufficient documentation

## 2014-09-01 DIAGNOSIS — K219 Gastro-esophageal reflux disease without esophagitis: Secondary | ICD-10-CM | POA: Insufficient documentation

## 2014-09-01 DIAGNOSIS — Z8742 Personal history of other diseases of the female genital tract: Secondary | ICD-10-CM | POA: Diagnosis not present

## 2014-09-01 DIAGNOSIS — E039 Hypothyroidism, unspecified: Secondary | ICD-10-CM | POA: Diagnosis not present

## 2014-09-01 DIAGNOSIS — R05 Cough: Secondary | ICD-10-CM | POA: Insufficient documentation

## 2014-09-01 DIAGNOSIS — R079 Chest pain, unspecified: Secondary | ICD-10-CM | POA: Insufficient documentation

## 2014-09-01 DIAGNOSIS — F17200 Nicotine dependence, unspecified, uncomplicated: Secondary | ICD-10-CM | POA: Insufficient documentation

## 2014-09-01 DIAGNOSIS — E785 Hyperlipidemia, unspecified: Secondary | ICD-10-CM | POA: Diagnosis not present

## 2014-09-01 LAB — COMPREHENSIVE METABOLIC PANEL
ALT: 15 U/L (ref 0–35)
AST: 20 U/L (ref 0–37)
Albumin: 3.1 g/dL — ABNORMAL LOW (ref 3.5–5.2)
Alkaline Phosphatase: 44 U/L (ref 39–117)
Anion gap: 12 (ref 5–15)
BUN: 14 mg/dL (ref 6–23)
CO2: 28 mEq/L (ref 19–32)
Calcium: 8.3 mg/dL — ABNORMAL LOW (ref 8.4–10.5)
Chloride: 102 mEq/L (ref 96–112)
Creatinine, Ser: 0.7 mg/dL (ref 0.50–1.10)
GFR calc Af Amer: >90 mL/min (ref >90)
GFR calc non Af Amer: >90 mL/min (ref >90)
Glucose, Bld: 90 mg/dL (ref 70–99)
Potassium: 4.5 mEq/L (ref 3.7–5.3)
Sodium: 142 mEq/L (ref 137–147)
Total Bilirubin: <0.2 mg/dL — ABNORMAL LOW (ref 0.3–1.2)
Total Protein: 6.3 g/dL (ref 6.0–8.3)

## 2014-09-01 LAB — CBC WITH DIFFERENTIAL/PLATELET
Basophils Absolute: 0.1 10*3/uL (ref 0.0–0.1)
Basophils Relative: 1 % (ref 0–1)
Eosinophils Absolute: 0.4 10*3/uL (ref 0.0–0.7)
Eosinophils Relative: 3 % (ref 0–5)
HCT: 40.4 % (ref 36.0–46.0)
Hemoglobin: 13.3 g/dL (ref 12.0–15.0)
Lymphocytes Relative: 14 % (ref 12–46)
Lymphs Abs: 1.7 10*3/uL (ref 0.7–4.0)
MCH: 31.1 pg (ref 26.0–34.0)
MCHC: 32.9 g/dL (ref 30.0–36.0)
MCV: 94.6 fL (ref 78.0–100.0)
Monocytes Absolute: 1.4 10*3/uL — ABNORMAL HIGH (ref 0.1–1.0)
Monocytes Relative: 11 % (ref 3–12)
Neutro Abs: 9.2 10*3/uL — ABNORMAL HIGH (ref 1.7–7.7)
Neutrophils Relative %: 73 % (ref 43–77)
Platelets: 303 10*3/uL (ref 150–400)
RBC: 4.27 MIL/uL (ref 3.87–5.11)
RDW: 12.3 % (ref 11.5–15.5)
WBC: 12.7 10*3/uL — ABNORMAL HIGH (ref 4.0–10.5)

## 2014-09-01 LAB — TROPONIN I: Troponin I: <0.3 ng/mL (ref <0.30)

## 2014-09-01 MED ORDER — GUAIFENESIN-CODEINE 100-10 MG/5ML PO SOLN: 10.0000 mL | Freq: Four times a day (QID) | ORAL | Status: DC | PRN

## 2014-09-01 NOTE — ED Provider Notes (Signed)
CSN: [id number]     Arrival date & time 09/01/14  9562 History   First MD Initiated Contact with Patient 09/01/14 [phone]     Chief Complaint  Patient presents with  . Chest Pain     (Consider location/radiation/quality/duration/timing/severity/associated sxs/prior Treatment) HPI Comments: Patient is a 53 year old female with history of high cholesterol and thyroid disease. She presents today with complaints of chest discomfort that started yesterday evening. She woke up this morning with tightness in her chest and laryngitis. She has been coughing up green sputum. She has no prior cardiac history. She does have a cardiologist and had a normal stress test in January of 2013.  Patient is a 53 y.o. female presenting with chest pain. The history is provided by the patient.  Chest Pain Pain location:  Substernal area Pain quality: tightness   Pain radiates to:  Does not radiate Pain radiates to the back: no   Pain severity:  Moderate Onset quality:  Sudden Duration:  12 hours Timing:  Constant Progression:  Worsening Chronicity:  New Context: not breathing   Relieved by:  Nothing Worsened by:  Nothing tried Ineffective treatments:  None tried Associated symptoms: cough   Associated symptoms: no nausea, no palpitations and no shortness of breath     Past Medical History  Diagnosis Date  . 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 This was associated with  Subsequent to that time she has had repeat laparoscopies for chronic, particularly right sided, pelvic pain on two occasions, one in 2000 and again in June of 2003.  At each time, pelvic adhesions were noted but no other significant pelvic pathology  . Ulcer disease     currently asymptomatic and not on medication  . Other and unspecified ovarian cysts     long history of recurring ovarian  cysts  . Hyperlipidemia   . Thyroid disease   . Right bundle branch block   . Hypercholesterolemia   . Tobacco abuse   . 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 Hypothyroidism   . GERD (gastroesophageal reflux disease)    Past Surgical History  Procedure Laterality Date  . Total abdominal hysterectomy w/ bilateral salpingoophorectomy  1995  . Ovarian cyst removal  1995  . Appendectomy      for obliteration of the appendiceal tip  . Tubal ligation  1989  . Breast biopsy  02/1992    biopsy of benign breast mass  . Total thyroidectomy  05/13/2004    Multiple thyroid nodules, dominant mass, left thyroid lobe -- by, Earnstine Regal, M.D  . Esophagogastroduodenoscopy  12/06/2005    Normal esophagogastroduodenoscopy --  Earle Gell, M.D.   Family History  Problem Relation Age of Onset  . Heart disease Father   . Asthma Mother   . Diabetes Mother   . Heart disease      fathers side has a strong history of heart disease  . Hyperlipidemia      strong family history of  . Heart attack Father    History  Substance Use Topics  . Smoking status: Current Every Day Smoker -- 0.50 packs/day for 19 years    Types: Cigarettes    Last Attempt to Quit: 05/09/2011  . Smokeless tobacco: Never Used  . Alcohol Use: No   OB History   Grav Para Term Preterm Abortions TAB SAB  Ect Mult Living                 Review of Systems  Respiratory: Positive for cough. Negative for shortness of breath.   Cardiovascular: Positive for chest pain. Negative for palpitations.  Gastrointestinal: Negative for nausea.  All other systems reviewed and are negative.     Allergies  Promethazine hcl  Home Medications   Prior to Admission medications   Medication Sig Start Date End Date Taking? Authorizing Provider  Acetaminophen-Caffeine (EXCEDRIN TENSION HEADACHE PO) Take 1-2 tablets by mouth as needed. headache    Historical Provider, MD  calcium carbonate (OS-CAL) 600 MG TABS Take 600 mg by  mouth 2 (two) times daily with a meal.      Historical Provider, MD  estradiol (ESTRACE) 2 MG tablet Take 2 mg by mouth Daily.  05/12/11   Historical Provider, MD  NITROSTAT 0.4 MG SL tablet Place 0.4 mg under the tongue.  01/24/12   Historical Provider, MD  omeprazole (PRILOSEC) 40 MG capsule Take 40 mg by mouth Daily.  06/03/11   Historical Provider, MD  simvastatin (ZOCOR) 80 MG tablet TAKE 1 TABLET BY MOUTH AT BEDTIME 04/23/14   [name], MD  SYNTHROID 100 MCG tablet Take 100 mcg by mouth Daily.  05/20/11   Historical Provider, MD  varenicline (CHANTIX CONTINUING MONTH PAK) 1 MG tablet Take 1 tablet (1 mg total) by mouth 2 (two) times daily. 04/16/14   [name], MD  varenicline (CHANTIX STARTING MONTH PAK) 0.5 MG X 11 & 1 MG X 42 tablet Take one 0.5 mg tablet by mouth once daily for 3 days, then increase to one 0.5 mg tablet twice daily for 4 days, then increase to one 1 mg tablet twice daily. 04/16/14   [name], MD   BP 166/86  Pulse 68  Temp(Src) 98.1 F (36.7 C) (Oral)  Resp 16  SpO2 100% Physical Exam  Nursing note and vitals reviewed. Constitutional: She is oriented to person, place, and time. She appears well-developed and well-nourished. No distress.  HENT:  Head: Normocephalic and atraumatic.  Neck: Normal range of motion. Neck supple.  Cardiovascular: Normal rate and regular rhythm.  Exam reveals no gallop and no friction rub.   No murmur heard. Pulmonary/Chest: Effort normal and breath sounds normal. No respiratory distress. She has no wheezes.  Abdominal: Soft. Bowel sounds are normal. She exhibits no distension. There is no tenderness.  Musculoskeletal: Normal range of motion.  Neurological: She is alert and oriented to person, place, and time.  Skin: Skin is warm and dry. She is not diaphoretic.    ED Course  Procedures (including critical care time) Labs Review Labs Reviewed - No data to display  Imaging Review No results found.   EKG  Interpretation   Date/Time:  Monday September 01 2014 09:34:55 EDT Ventricular Rate:  66 PR Interval:  140 QRS Duration: 84 QT Interval:  386 QTC Calculation: 404 R Axis:   70 Text Interpretation:  Normal sinus rhythm Septal infarct , age  undetermined Abnormal ECG Confirmed by DELOS  MD, Amarachukwu Lakatos ([id number]) on  09/01/2014 9:37:33 AM      MDM   Final diagnoses:  None    Workup reveals no elevation of troponin and an unchanged EKG. Her symptoms sound infectious in nature. She has a hoarse voice and productive cough. She will be treated with cough syrup and when necessary return.    Veryl Speak, MD 09/01/14 1059

## 2014-09-01 NOTE — ED Notes (Signed)
Pt amb to room 4 with quick steady gait in nad. Pt reports cough and sore throat x yesterday and she awoke this am with "tightness in my chest, like I'm getting a chest cold". Pt denies any dizzyness, sob, nausea or any other c/o. ekg in progress.

## 2014-09-01 NOTE — ED Notes (Signed)
MD at bedside. 

## 2014-09-01 NOTE — Discharge Instructions (Signed)
Robitussin with codeine as prescribed as needed for cough.  Return to the emergency department if you develop severe chest pain, difficulty breathing, high fever, or other new and concerning symptoms.   Chest Pain (Nonspecific) It is often hard to give a specific diagnosis for the cause of chest pain. There is always a chance that your pain could be related to something serious, such as a heart attack or a blood clot in the lungs. You need to follow up with your health care provider for further evaluation. CAUSES   Heartburn.  Pneumonia or bronchitis.  Anxiety or stress.  Inflammation around your heart (pericarditis) or lung (pleuritis or pleurisy).  A blood clot in the lung.  A collapsed lung (pneumothorax). It can develop suddenly on its own (spontaneous pneumothorax) or from trauma to the chest.  Shingles infection (herpes zoster virus). The chest wall is composed of bones, muscles, and cartilage. Any of these can be the source of the pain.  The bones can be bruised by injury.  The muscles or cartilage can be strained by coughing or overwork.  The cartilage can be affected by inflammation and become sore (costochondritis). DIAGNOSIS  Lab tests or other studies may be needed to find the cause of your pain. Your health care provider may have you take a test called an ambulatory electrocardiogram (ECG). An ECG records your heartbeat patterns over a 24-hour period. You may also have other tests, such as:  Transthoracic echocardiogram (TTE). During echocardiography, sound waves are used to evaluate how blood flows through your heart.  Transesophageal echocardiogram (TEE).  Cardiac monitoring. This allows your health care provider to monitor your heart rate and rhythm in real time.  Holter monitor. This is a portable device that records your heartbeat and can help diagnose heart arrhythmias. It allows your health care provider to track your heart activity for several days, if  needed.  Stress tests by exercise or by giving medicine that makes the heart beat faster. TREATMENT   Treatment depends on what may be causing your chest pain. Treatment may include:  Acid blockers for heartburn.  Anti-inflammatory medicine.  Pain medicine for inflammatory conditions.  Antibiotics if an infection is present.  You may be advised to change lifestyle habits. This includes stopping smoking and avoiding alcohol, caffeine, and chocolate.  You may be advised to keep your head raised (elevated) when sleeping. This reduces the chance of acid going backward from your stomach into your esophagus. Most of the time, nonspecific chest pain will improve within 2-3 days with rest and mild pain medicine.  HOME CARE INSTRUCTIONS   If antibiotics were prescribed, take them as directed. Finish them even if you start to feel better.  For the next few days, avoid physical activities that bring on chest pain. Continue physical activities as directed.  Do not use any tobacco products, including cigarettes, chewing tobacco, or electronic cigarettes.  Avoid drinking alcohol.  Only take medicine as directed by your health care provider.  Follow your health care provider's suggestions for further testing if your chest pain does not go away.  Keep any follow-up appointments you made. If you do not go to an appointment, you could develop lasting (chronic) problems with pain. If there is any problem keeping an appointment, call to reschedule. SEEK MEDICAL CARE IF:   Your chest pain does not go away, even after treatment.  You have a rash with blisters on your chest.  You have a fever. SEEK IMMEDIATE MEDICAL CARE IF:  You have increased chest pain or pain that spreads to your arm, neck, jaw, back, or abdomen.  You have shortness of breath.  You have an increasing cough, or you cough up blood.  You have severe back or abdominal pain.  You feel nauseous or vomit.  You have severe  weakness.  You faint.  You have chills. This is an emergency. Do not wait to see if the pain will go away. Get medical help at once. Call your local emergency services (911 in [place] Do not drive yourself to the hospital. MAKE SURE YOU:   Understand these instructions.  Will watch your condition.  Will get help right away if you are not doing well or get worse. Document Released: 09/21/2005 Document Revised: 12/17/2013 Document Reviewed: 07/17/2008 [facility] Patient Information 2015 [place], [place]. This information is not intended to replace advice given to you by your health care provider. Make sure you discuss any questions you have with your health care provider.

## 2014-09-23 IMAGING — CT CT ABD-PELV W/ CM
3 of 5 series · 13 of 36 positions shown, 19 images · IV contrast (READICAT/WATER & [ID] OMNI 300)
Comparison: None.

CLINICAL DATA: Left lower quadrant pain.

CT ABDOMEN AND PELVIS WITH CONTRAST
TECHNIQUE: Multidetector CT imaging of the abdomen and pelvis was
performed following the standard protocol during bolus
administration of intravenous contrast.
Contrast: 100mL OMNIPAQUE IOHEXOL 300 MG/ML  SOLN

[Series 3: abd/pelvis with · axial · 0.79mm/px · z∈[-370,-45]mm · 7 of 87 slices shown, 12 images]
[im 11/87  soft-tissue]
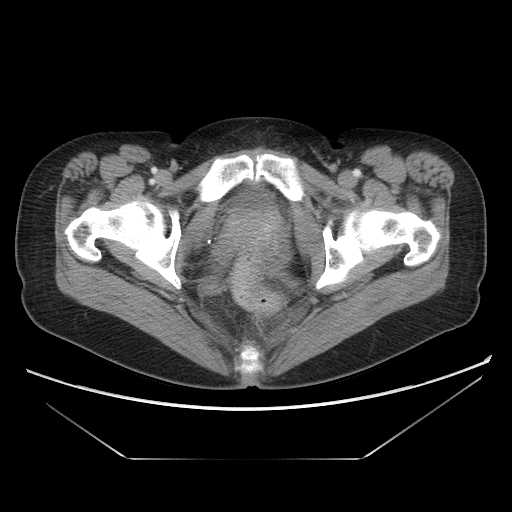
[im 11/87  bone]
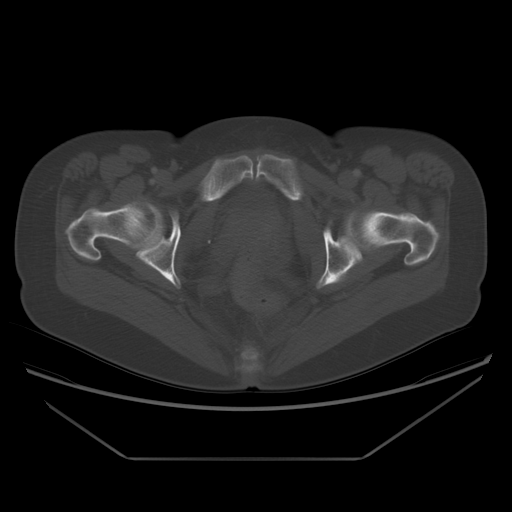
[im 22/87  soft-tissue]
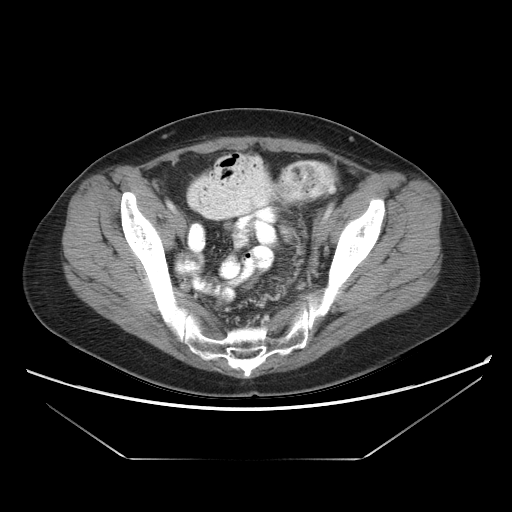
[im 33/87  soft-tissue]
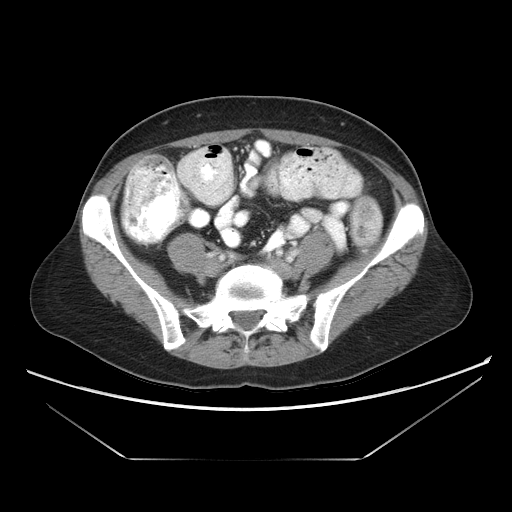
[im 44/87  soft-tissue]
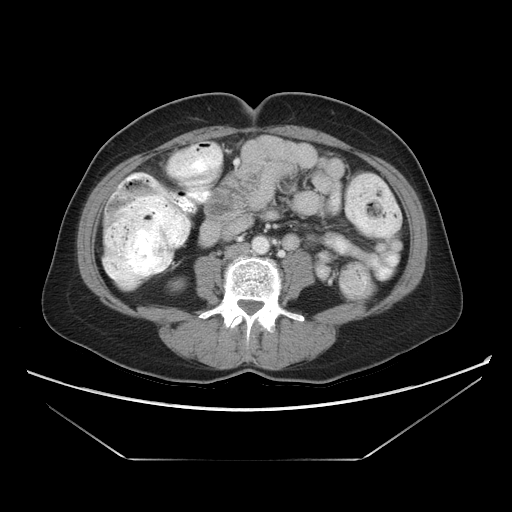
[im 44/87  lung]
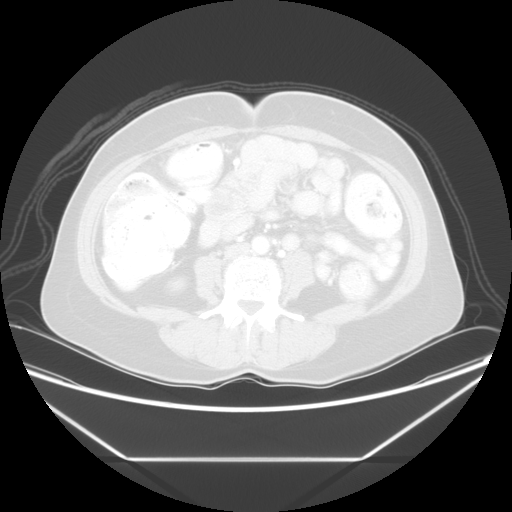
[im 54/87  soft-tissue]
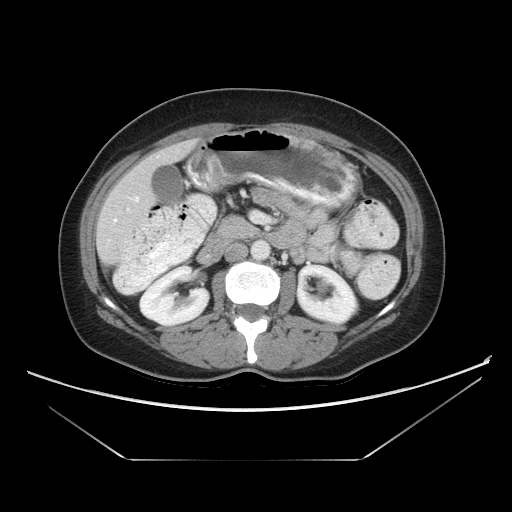
[im 54/87  lung]
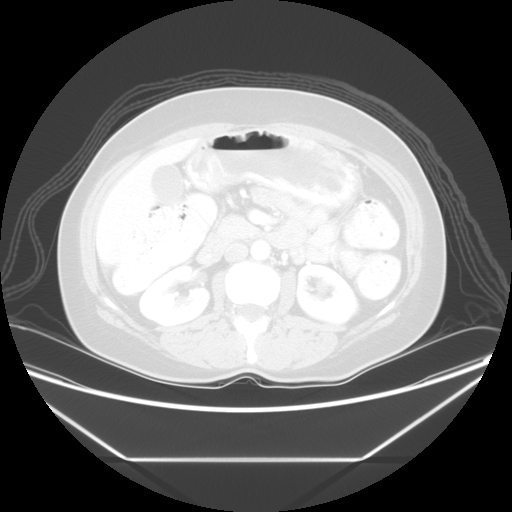
[im 65/87  soft-tissue]
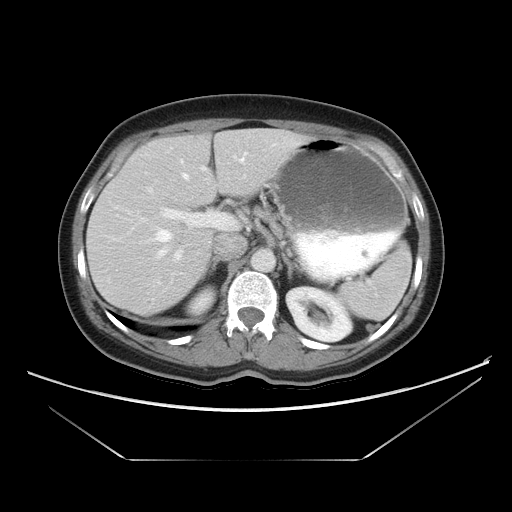
[im 65/87  lung]
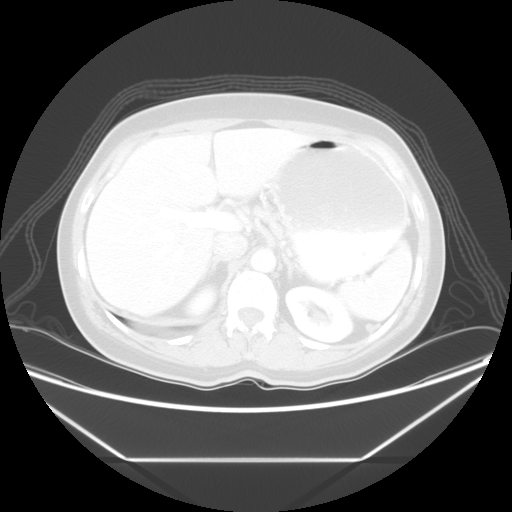
[im 76/87  soft-tissue]
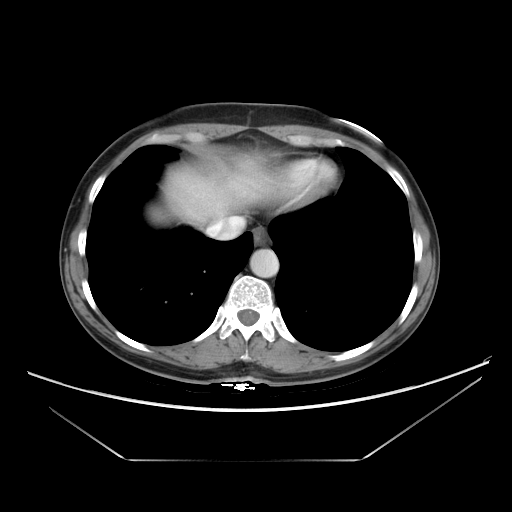
[im 76/87  lung]
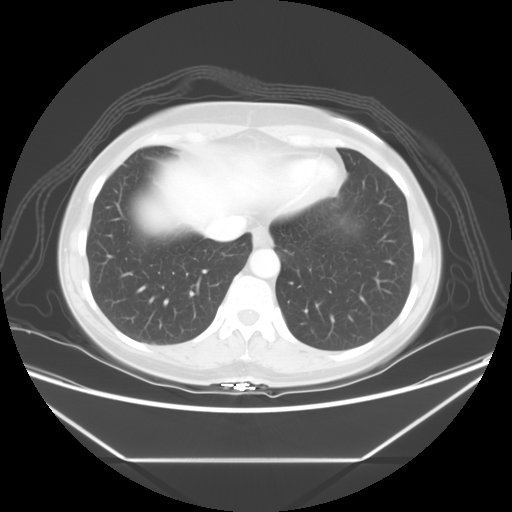

[Series 601: coronal body · coronal · 0.84mm/px · 1 of 101 slices shown, 2 images]
[im 34/101  soft-tissue]
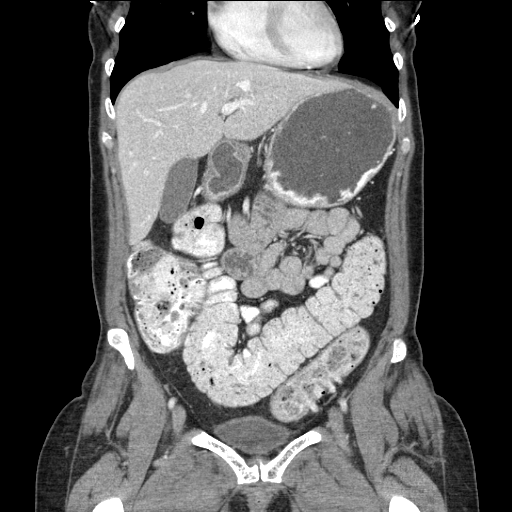
[im 34/101  bone]
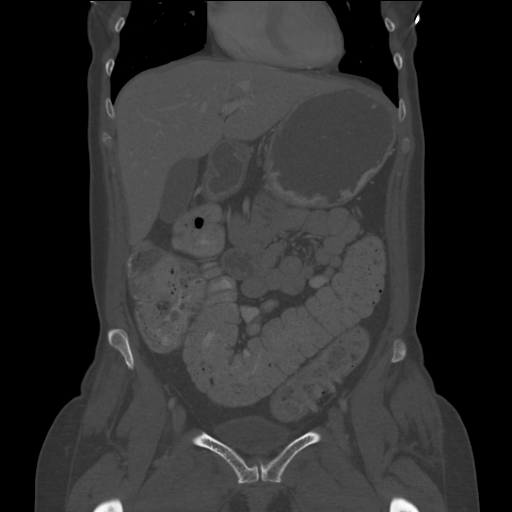

[Series 602: sagittal body · sagittal · 0.84mm/px · 5 of 154 slices shown]
[im 20/154  soft-tissue]
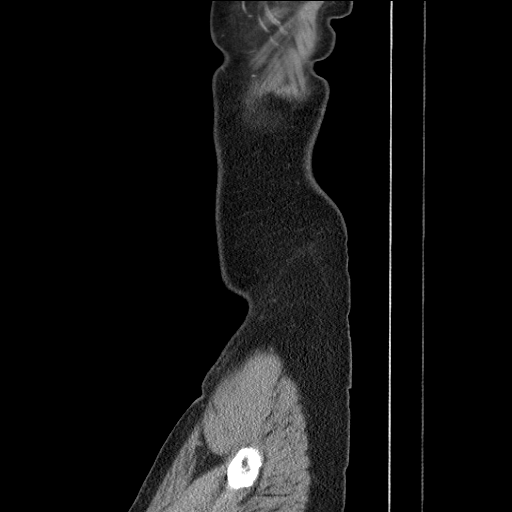
[im 39/154  soft-tissue]
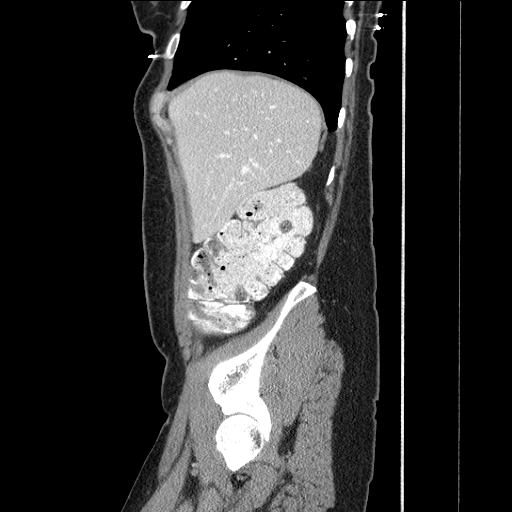
[im 58/154  soft-tissue]
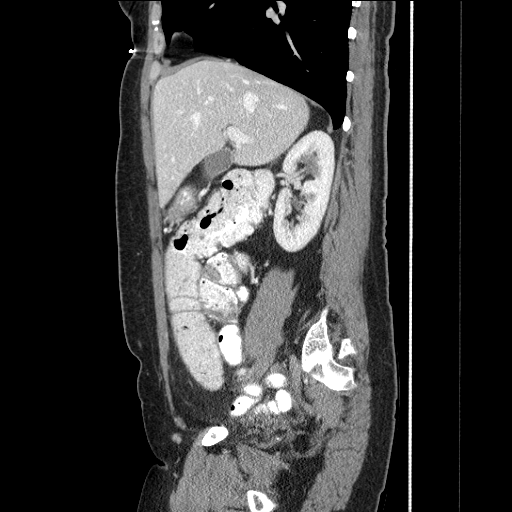
[im 77/154  soft-tissue]
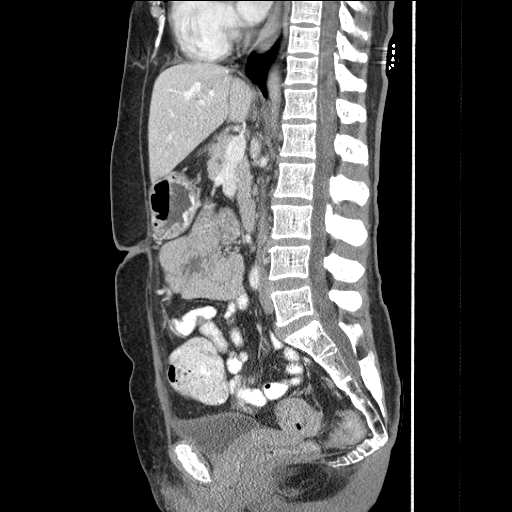
[im 87/154  soft-tissue]
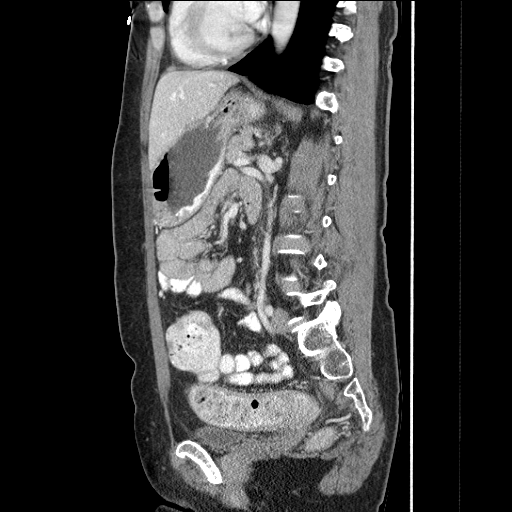

[13 of 36 positions shown; findings below may reference images not displayed]

FINDINGS: Lung bases are clear.  No effusions.  Heart is normal
size.

Liver, gallbladder, spleen, pancreas, adrenals and kidneys or.
Gallbladder and stomach grossly unremarkable.

Large stool burden throughout the colon.  Descending colonic and
sigmoid diverticulosis.  No definite evidence for acute
cholecystitis.  Trace free fluid in the pelvis.

Prior hysterectomy.  No adnexal masses.  Small bowel is
decompressed.  Aorta is normal caliber.

No acute bony abnormality.  Sclerotic area within the right femoral
neck and right iliac bone likely reflect bone islands in the
absence of any cancer history.
IMPRESSION: Large stool burden throughout the colon.  Descending colonic and
sigmoid diverticulosis.  No convincing evidence for active
diverticulitis.

## 2015-01-23 LAB — LIPID PANEL
Cholesterol: 175 mg/dL (ref 0–200)
HDL: 80 mg/dL — AB (ref 35–70)
LDL Cholesterol: 72 mg/dL
Triglycerides: 116 mg/dL (ref 40–160)

## 2015-01-23 LAB — CBC AND DIFFERENTIAL
HCT: 46 % (ref 36–46)
Hemoglobin: 15 g/dL (ref 12.0–16.0)
Platelets: 349 10*3/uL (ref 150–399)
WBC: 9.7 10^3/mL

## 2015-01-23 LAB — BASIC METABOLIC PANEL
BUN: 13 mg/dL (ref 4–21)
Creatinine: 0.6 mg/dL (ref 0.5–1.1)
Glucose: 72 mg/dL
Potassium: 4.3 mmol/L (ref 3.4–5.3)
Sodium: 141 mmol/L (ref 137–147)

## 2015-01-23 LAB — HEPATIC FUNCTION PANEL
ALT: 12 U/L (ref 7–35)
AST: 18 U/L (ref 13–35)
Alkaline Phosphatase: 44 U/L (ref 25–125)
Bilirubin, Total: 0.2 mg/dL

## 2015-04-20 ENCOUNTER — Ambulatory Visit (INDEPENDENT_AMBULATORY_CARE_PROVIDER_SITE_OTHER): Payer: BLUE CROSS/BLUE SHIELD | Admitting: Cardiovascular Disease

## 2015-04-20 ENCOUNTER — Encounter: Payer: Self-pay | Admitting: Cardiovascular Disease

## 2015-04-20 DIAGNOSIS — R079 Chest pain, unspecified: Secondary | ICD-10-CM | POA: Diagnosis not present

## 2015-04-20 NOTE — Progress Notes (Signed)
Cardiology Office Note   Date:  04/20/2015   ID:  Cesiah, Westley January 13, 1961, MRN [id number]  PCP:  Curly Rim, MD  Cardiologist:  [name], MD    Chief Complaint  Patient presents with  . Chest Pain    History of Present Illness: Christina Watts is a 54 y.o. female who presents for follow-up evaluation.  The patient has a history of chest pain. Cardiovascular risk factors include family history of premature CAD, tobacco abuse, and hyperlipidemia. She had a stress Myoview scan in 2013 demonstrating no ischemia.  She complains of a feeling of 'indigestion' with exercise. She has not had this with rest. This has been present for 2 weeks. She's taken mylanta and this improves her discomfort. Complains of generalized fatigue but denies shortness of breath. Symptoms also improved with rest. She denies shortness of breath, edema, lightheadedness, or syncope.  Past Medical History  Diagnosis Date  . 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 This was associated with  Subsequent to that time she has had repeat laparoscopies for chronic, particularly right sided, pelvic pain on two occasions, one in 2000 and again in June of 2003.  At each time, pelvic adhesions were noted but no other significant pelvic pathology  . Ulcer disease     currently asymptomatic and not on medication  . Other and unspecified ovarian cysts     long history of recurring ovarian cysts  . Hyperlipidemia   . Thyroid disease   . Right bundle branch block   . Hypercholesterolemia   . Tobacco abuse   . 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 Hypothyroidism   . GERD (gastroesophageal reflux disease)     Past Surgical History  Procedure Laterality Date  . Total abdominal hysterectomy w/ bilateral salpingoophorectomy  1995  . Ovarian cyst removal   1995  . Appendectomy      for obliteration of the appendiceal tip  . Tubal ligation  1989  . Breast biopsy  02/1992    biopsy of benign breast mass  . Total thyroidectomy  05/13/2004    Multiple thyroid nodules, dominant mass, left thyroid lobe -- by, Earnstine Regal, M.D  . Esophagogastroduodenoscopy  12/06/2005    Normal esophagogastroduodenoscopy --  Earle Gell, M.D.    Current Outpatient Prescriptions  Medication Sig Dispense Refill  . Acetaminophen-Caffeine (EXCEDRIN TENSION HEADACHE PO) Take 1-2 tablets by mouth as needed. headache    . aspirin 81 MG tablet Take 81 mg by mouth daily.    . calcium carbonate (OS-CAL) 600 MG TABS Take 600 mg by mouth 2 (two) times daily with a meal.      . estradiol (ESTRACE) 2 MG tablet Take 2 mg by mouth Daily.     Marland Kitchen NITROSTAT 0.4 MG SL tablet Place 0.4 mg under the tongue.     . simvastatin (ZOCOR) 80 MG tablet TAKE 1 TABLET BY MOUTH AT BEDTIME 30 tablet 11  . SYNTHROID 100 MCG tablet Take 100 mcg by mouth Daily.      No current facility-administered medications for this visit.    Allergies:   Promethazine hcl   Social History:  The patient  reports that she has been smoking Cigarettes.  She has a 9.5 pack-year smoking history. She has never used smokeless tobacco. She reports that she does not drink alcohol  or use illicit drugs.   Family History:  The patient's  family history includes Asthma in her mother; Diabetes in her mother; Heart attack in her father; Heart disease in her father and another family member; Hyperlipidemia in an other family member.    ROS:  Please see the history of present illness.  Otherwise, review of systems is positive for back pain, headaches.  All other systems are reviewed and negative.    PHYSICAL EXAM: VS:  BP 110/84 mmHg  Pulse 72  Ht 5\' 2"  (1.575 m)  Wt 126 lb 12.8 oz (57.516 kg)  BMI 23.19 kg/m2 , BMI Body mass index is 23.19 kg/(m^2). GEN: Well nourished, well developed, in no acute distress HEENT:  normal Neck: no JVD, no masses. No carotid bruits Cardiac: RRR without murmur or gallop                Respiratory:  clear to auscultation bilaterally, normal work of breathing GI: soft, nontender, nondistended, + BS MS: no deformity or atrophy Ext: no pretibial edema, pedal pulses 2+= bilaterally Skin: warm and dry, no rash Neuro:  Strength and sensation are intact Psych: euthymic mood, full affect  EKG:  EKG is ordered today. The ekg ordered today shows normal sinus rhythm with occasional PVC, heart rate 72 bpm, otherwise within normal limits.  Recent Labs: 09/01/2014: ALT 15; BUN 14; Creatinine 0.70; Hemoglobin 13.3; Platelets 303; Potassium 4.5; Sodium 142   Lipid Panel     Component Value Date/Time   CHOL 182 04/18/2014 0743   TRIG 128.0 04/18/2014 0743   HDL 66.20 04/18/2014 0743   CHOLHDL 3 04/18/2014 0743   VLDL 25.6 04/18/2014 0743   LDLCALC 90 04/18/2014 0743      Wt Readings from Last 3 Encounters:  04/20/15 126 lb 12.8 oz (57.516 kg)  04/16/14 126 lb (57.153 kg)  01/26/12 128 lb (58.06 kg)    ASSESSMENT AND PLAN: 1.  Hyperlipidemia: The patient continues on simvastatin. Last year's lipids were reviewed. Will repeat when she comes in for her stress test.  2. Tobacco abuse: She quit temporarily with the help of Chantix. She is now back to smoking because of stresses at home. Cessation counseling was done.  3. Heart palpitations: These continue without significant change.  4. Exertional chest pain: Symptoms are concerning for the interval development of angina. She has multiple cardiac risk factors. I have recommended an exercise Myoview stress test. She was advised to continue on aspirin 81 mg daily.  Current medicines are reviewed with the patient today.  The patient does not have concerns regarding medicines.  Labs/ tests ordered today include:   Orders Placed This Encounter  Procedures  . Lipid panel  . Hepatic function panel  . Myocardial Perfusion  Imaging  . EKG 12-Lead    Disposition:   FU one year unless stress test abnormal  Signed, [name], MD  04/20/2015 6:02 PM    [facility] [address] [place], [place], [place]  [address] Phone: [phone]; Fax: [phone]

## 2015-04-20 NOTE — Patient Instructions (Signed)
Medication Instructions:  Your physician recommends that you continue on your current medications as directed. Please refer to the Current Medication list given to you today.  Labwork: Your physician recommends that you return for a FASTING LIPID and LIVER the same day as myoview--nothing to eat or drink after midnight.   Testing/Procedures: Your physician has requested that you have an exercise stress myoview. For further information please visit [email or web address]. Please follow instruction sheet, as given.  Follow-Up: Your physician wants you to follow-up in: 1 YEAR with Dr Burt Knack.  You will receive a reminder letter in the mail two months in advance. If you don't receive a letter, please call our office to schedule the follow-up appointment.  Any Other Special Instructions Will Be Listed Below (If Applicable).

## 2015-04-28 ENCOUNTER — Telehealth (HOSPITAL_COMMUNITY): Payer: Self-pay

## 2015-04-28 NOTE — Telephone Encounter (Signed)
Patient given detailed instructions per Myocardial Perfusion Study Information Sheet for test on 04-29-2015 at 11:45am. Patient verbalized understanding. Oletta Lamas, Dalonda Simoni A

## 2015-04-29 ENCOUNTER — Other Ambulatory Visit (INDEPENDENT_AMBULATORY_CARE_PROVIDER_SITE_OTHER): Payer: BLUE CROSS/BLUE SHIELD | Admitting: *Deleted

## 2015-04-29 ENCOUNTER — Ambulatory Visit (HOSPITAL_COMMUNITY): Payer: BLUE CROSS/BLUE SHIELD | Attending: Cardiovascular Disease

## 2015-04-29 DIAGNOSIS — R9439 Abnormal result of other cardiovascular function study: Secondary | ICD-10-CM | POA: Insufficient documentation

## 2015-04-29 DIAGNOSIS — R079 Chest pain, unspecified: Secondary | ICD-10-CM

## 2015-04-29 DIAGNOSIS — I4510 Unspecified right bundle-branch block: Secondary | ICD-10-CM | POA: Diagnosis not present

## 2015-04-29 LAB — HEPATIC FUNCTION PANEL
ALT: 14 U/L (ref 0–35)
AST: 21 U/L (ref 0–37)
Albumin: 3.8 g/dL (ref 3.5–5.2)
Alkaline Phosphatase: 42 U/L (ref 39–117)
Bilirubin, Direct: 0 mg/dL (ref 0.0–0.3)
Total Bilirubin: 0.2 mg/dL (ref 0.2–1.2)
Total Protein: 6.7 g/dL (ref 6.0–8.3)

## 2015-04-29 LAB — MYOCARDIAL PERFUSION IMAGING
Estimated workload: 7 METS
Exercise duration (min): 5 min
Exercise duration (sec): 30 s
LV dias vol: 81 mL
LV sys vol: 39 mL
MPHR: 165 {beats}/min
Nuc Stress EF: 51 %
Peak BP: 160 mmHg
Peak HR: 155 {beats}/min
Percent HR: 93 %
Percent of predicted max HR: 93 %
RATE: 0.22
RPE: 16
Rest HR: 71 {beats}/min
SDS: 3
SRS: 3
SSS: 6
Stage 1 DBP: 82 mmHg
Stage 1 Grade: 0 %
Stage 1 HR: 75 {beats}/min
Stage 1 SBP: 109 mmHg
Stage 1 Speed: 0 mph
Stage 2 DBP: 79 mmHg
Stage 2 Grade: 0 %
Stage 2 HR: 82 {beats}/min
Stage 2 SBP: 102 mmHg
Stage 2 Speed: 0 mph
Stage 3 Grade: 0 %
Stage 3 HR: 84 {beats}/min
Stage 3 Speed: 0 mph
Stage 4 DBP: 77 mmHg
Stage 4 Grade: 10 %
Stage 4 HR: 127 {beats}/min
Stage 4 SBP: 135 mmHg
Stage 4 Speed: 1.7 mph
Stage 5 DBP: 78 mmHg
Stage 5 Grade: 12 %
Stage 5 HR: 155 {beats}/min
Stage 5 SBP: 160 mmHg
Stage 5 Speed: 2.5 mph
Stage 6 DBP: 79 mmHg
Stage 6 Grade: 0 %
Stage 6 HR: 146 {beats}/min
Stage 6 SBP: 138 mmHg
Stage 6 Speed: 0 mph
Stage 7 DBP: 75 mmHg
Stage 7 Grade: 0 %
Stage 7 HR: 86 {beats}/min
Stage 7 SBP: 110 mmHg
Stage 7 Speed: 0 mph
TID: 0.9

## 2015-04-29 LAB — LIPID PANEL
Cholesterol: 156 mg/dL (ref 0–200)
HDL: 64.4 mg/dL (ref >39.00)
LDL Cholesterol: 72 mg/dL (ref 0–99)
NonHDL: 91.6
Total CHOL/HDL Ratio: 2
Triglycerides: 97 mg/dL (ref 0.0–149.0)
VLDL: 19.4 mg/dL (ref 0.0–40.0)

## 2015-04-29 MED ORDER — TECHNETIUM TC 99M SESTAMIBI GENERIC - CARDIOLITE
11.0000 | Freq: Once | INTRAVENOUS | Status: AC | PRN
  Administered 2015-04-29: 11 via INTRAVENOUS

## 2015-04-29 MED ORDER — TECHNETIUM TC 99M SESTAMIBI GENERIC - CARDIOLITE
33.0000 | Freq: Once | INTRAVENOUS | Status: AC | PRN
  Administered 2015-04-29: 33 via INTRAVENOUS

## 2015-05-04 ENCOUNTER — Other Ambulatory Visit: Payer: Self-pay

## 2015-05-04 ENCOUNTER — Encounter: Payer: Self-pay | Admitting: Cardiovascular Disease

## 2015-05-04 MED ORDER — ISOSORBIDE MONONITRATE ER 30 MG PO TB24: 15.0000 mg | ORAL_TABLET | Freq: Every day | ORAL | Status: DC

## 2015-05-04 MED ORDER — NITROGLYCERIN 0.4 MG SL SUBL: 0.4000 mg | SUBLINGUAL_TABLET | SUBLINGUAL | Status: DC | PRN

## 2015-05-04 MED ORDER — SIMVASTATIN 80 MG PO TABS: ORAL_TABLET | ORAL | Status: DC

## 2015-05-04 NOTE — Telephone Encounter (Signed)
New message     Patient calling back to speak with the nurse

## 2015-05-04 NOTE — Telephone Encounter (Signed)
This encounter was created in error - please disregard.

## 2015-05-05 ENCOUNTER — Encounter: Payer: Self-pay | Admitting: Cardiovascular Disease

## 2015-05-05 NOTE — Telephone Encounter (Signed)
New message  ° ° °Patient calling back to speak with nurse  °

## 2015-05-05 NOTE — Telephone Encounter (Signed)
This encounter was created in error - please disregard.

## 2015-05-14 ENCOUNTER — Encounter (HOSPITAL_COMMUNITY): Payer: Self-pay | Admitting: Cardiovascular Disease

## 2015-05-14 ENCOUNTER — Encounter (HOSPITAL_COMMUNITY)
Admission: RE | Disposition: A | Discharge status: Home / Self Care | Payer: BLUE CROSS/BLUE SHIELD | Source: Ambulatory Visit | Attending: Cardiovascular Disease

## 2015-05-14 ENCOUNTER — Ambulatory Visit (HOSPITAL_COMMUNITY)
Admission: RE | Admit: 2015-05-14 | Discharge: 2015-05-14 | Disposition: A | Discharge status: Home / Self Care | Payer: BLUE CROSS/BLUE SHIELD | Source: Ambulatory Visit | Attending: Cardiovascular Disease | Admitting: Cardiovascular Disease

## 2015-05-14 DIAGNOSIS — E039 Hypothyroidism, unspecified: Secondary | ICD-10-CM | POA: Insufficient documentation

## 2015-05-14 DIAGNOSIS — E049 Nontoxic goiter, unspecified: Secondary | ICD-10-CM | POA: Diagnosis not present

## 2015-05-14 DIAGNOSIS — Z9049 Acquired absence of other specified parts of digestive tract: Secondary | ICD-10-CM | POA: Insufficient documentation

## 2015-05-14 DIAGNOSIS — E785 Hyperlipidemia, unspecified: Secondary | ICD-10-CM | POA: Insufficient documentation

## 2015-05-14 DIAGNOSIS — F17210 Nicotine dependence, cigarettes, uncomplicated: Secondary | ICD-10-CM | POA: Diagnosis not present

## 2015-05-14 DIAGNOSIS — Z90710 Acquired absence of both cervix and uterus: Secondary | ICD-10-CM | POA: Diagnosis not present

## 2015-05-14 DIAGNOSIS — K219 Gastro-esophageal reflux disease without esophagitis: Secondary | ICD-10-CM | POA: Insufficient documentation

## 2015-05-14 DIAGNOSIS — I4510 Unspecified right bundle-branch block: Secondary | ICD-10-CM | POA: Insufficient documentation

## 2015-05-14 DIAGNOSIS — Z9851 Tubal ligation status: Secondary | ICD-10-CM | POA: Diagnosis not present

## 2015-05-14 DIAGNOSIS — Z79899 Other long term (current) drug therapy: Secondary | ICD-10-CM | POA: Diagnosis not present

## 2015-05-14 DIAGNOSIS — E780 Pure hypercholesterolemia: Secondary | ICD-10-CM | POA: Insufficient documentation

## 2015-05-14 DIAGNOSIS — R0789 Other chest pain: Secondary | ICD-10-CM | POA: Diagnosis not present

## 2015-05-14 DIAGNOSIS — I208 Other forms of angina pectoris: Secondary | ICD-10-CM | POA: Diagnosis not present

## 2015-05-14 DIAGNOSIS — Z888 Allergy status to other drugs, medicaments and biological substances status: Secondary | ICD-10-CM | POA: Insufficient documentation

## 2015-05-14 HISTORY — PX: CARDIAC CATHETERIZATION: SHX172

## 2015-05-14 LAB — BASIC METABOLIC PANEL
Anion gap: 8 (ref 5–15)
BUN: 14 mg/dL (ref 6–20)
CO2: 25 mmol/L (ref 22–32)
Calcium: 7.9 mg/dL — ABNORMAL LOW (ref 8.9–10.3)
Chloride: 105 mmol/L (ref 101–111)
Creatinine, Ser: 0.75 mg/dL (ref 0.44–1.00)
GFR calc Af Amer: >60 mL/min (ref >60)
GFR calc non Af Amer: >60 mL/min (ref >60)
Glucose, Bld: 80 mg/dL (ref 65–99)
Potassium: 4.1 mmol/L (ref 3.5–5.1)
Sodium: 138 mmol/L (ref 135–145)

## 2015-05-14 LAB — CBC
HCT: 42 % (ref 36.0–46.0)
Hemoglobin: 14 g/dL (ref 12.0–15.0)
MCH: 30.9 pg (ref 26.0–34.0)
MCHC: 33.3 g/dL (ref 30.0–36.0)
MCV: 92.7 fL (ref 78.0–100.0)
Platelets: 342 10*3/uL (ref 150–400)
RBC: 4.53 MIL/uL (ref 3.87–5.11)
RDW: 12.3 % (ref 11.5–15.5)
WBC: 9 10*3/uL (ref 4.0–10.5)

## 2015-05-14 LAB — PROTIME-INR
INR: 0.88 (ref 0.00–1.49)
Prothrombin Time: 12 seconds (ref 11.6–15.2)

## 2015-05-14 SURGERY — LEFT HEART CATH AND CORONARY ANGIOGRAPHY
Anesthesia: LOCAL

## 2015-05-14 MED ORDER — LIDOCAINE HCL (PF) 1 % IJ SOLN
INTRAMUSCULAR | Status: AC
  Filled 2015-05-14: qty 30

## 2015-05-14 MED ORDER — MIDAZOLAM HCL 2 MG/2ML IJ SOLN
INTRAMUSCULAR | Status: DC | PRN
  Administered 2015-05-14: 2 mg via INTRAVENOUS
  Administered 2015-05-14: 1 mg via INTRAVENOUS

## 2015-05-14 MED ORDER — FENTANYL CITRATE (PF) 100 MCG/2ML IJ SOLN
INTRAMUSCULAR | Status: AC
  Filled 2015-05-14: qty 2

## 2015-05-14 MED ORDER — SODIUM CHLORIDE 0.9 % IJ SOLN: 3.0000 mL | INTRAMUSCULAR | Status: DC | PRN

## 2015-05-14 MED ORDER — ASPIRIN 81 MG PO CHEW
81.0000 mg | CHEWABLE_TABLET | ORAL | Status: AC
  Administered 2015-05-14: 81 mg via ORAL

## 2015-05-14 MED ORDER — VERAPAMIL HCL 2.5 MG/ML IV SOLN
INTRAVENOUS | Status: AC
  Filled 2015-05-14: qty 2

## 2015-05-14 MED ORDER — MIDAZOLAM HCL 2 MG/2ML IJ SOLN
INTRAMUSCULAR | Status: AC
  Filled 2015-05-14: qty 2

## 2015-05-14 MED ORDER — DIAZEPAM 5 MG PO TABS: 5.0000 mg | ORAL_TABLET | ORAL | Status: DC | PRN

## 2015-05-14 MED ORDER — SODIUM CHLORIDE 0.9 % IV SOLN
INTRAVENOUS | Status: DC | PRN
  Administered 2015-05-14: 57 mL/h via INTRAVENOUS

## 2015-05-14 MED ORDER — HEPARIN (PORCINE) IN NACL 2-0.9 UNIT/ML-% IJ SOLN
INTRAMUSCULAR | Status: AC
  Filled 2015-05-14: qty 1000

## 2015-05-14 MED ORDER — VERAPAMIL HCL 2.5 MG/ML IV SOLN
INTRAVENOUS | Status: DC | PRN
  Administered 2015-05-14: 08:00:00 via INTRA_ARTERIAL

## 2015-05-14 MED ORDER — SODIUM CHLORIDE 0.9 % IV SOLN: INTRAVENOUS | Status: DC

## 2015-05-14 MED ORDER — SODIUM CHLORIDE 0.9 % IJ SOLN: 3.0000 mL | Freq: Two times a day (BID) | INTRAMUSCULAR | Status: DC

## 2015-05-14 MED ORDER — ACETAMINOPHEN 325 MG PO TABS
ORAL_TABLET | ORAL | Status: AC
  Filled 2015-05-14: qty 2

## 2015-05-14 MED ORDER — ACETAMINOPHEN 325 MG PO TABS
650.0000 mg | ORAL_TABLET | ORAL | Status: DC | PRN
  Administered 2015-05-14: 650 mg via ORAL

## 2015-05-14 MED ORDER — IODIXANOL 320 MG/ML IV SOLN
INTRAVENOUS | Status: DC | PRN
  Administered 2015-05-14: 30 mL via INTRA_ARTERIAL

## 2015-05-14 MED ORDER — ASPIRIN 81 MG PO CHEW
CHEWABLE_TABLET | ORAL | Status: AC
  Administered 2015-05-14: 81 mg via ORAL
  Filled 2015-05-14: qty 1

## 2015-05-14 MED ORDER — SODIUM CHLORIDE 0.9 % WEIGHT BASED INFUSION
3.0000 mL/kg/h | INTRAVENOUS | Status: AC
  Administered 2015-05-14: 3 mL/kg/h via INTRAVENOUS

## 2015-05-14 MED ORDER — HEPARIN SODIUM (PORCINE) 1000 UNIT/ML IJ SOLN
INTRAMUSCULAR | Status: AC
  Filled 2015-05-14: qty 1

## 2015-05-14 MED ORDER — ONDANSETRON HCL 4 MG/2ML IJ SOLN: 4.0000 mg | Freq: Four times a day (QID) | INTRAMUSCULAR | Status: DC | PRN

## 2015-05-14 MED ORDER — SODIUM CHLORIDE 0.9 % IJ SOLN
3.0000 mL | Freq: Two times a day (BID) | INTRAMUSCULAR | Status: DC
Start: 2015-05-14 — End: 2015-05-14

## 2015-05-14 MED ORDER — FENTANYL CITRATE (PF) 100 MCG/2ML IJ SOLN
INTRAMUSCULAR | Status: DC | PRN
  Administered 2015-05-14 (×2): 25 ug via INTRAVENOUS

## 2015-05-14 MED ORDER — HEPARIN SODIUM (PORCINE) 1000 UNIT/ML IJ SOLN
INTRAMUSCULAR | Status: DC | PRN
  Administered 2015-05-14: 3000 [IU] via INTRAVENOUS

## 2015-05-14 MED ORDER — SODIUM CHLORIDE 0.9 % WEIGHT BASED INFUSION: 1.0000 mL/kg/h | INTRAVENOUS | Status: DC

## 2015-05-14 MED ORDER — SODIUM CHLORIDE 0.9 % IV SOLN: 250.0000 mL | INTRAVENOUS | Status: DC | PRN

## 2015-05-14 SURGICAL SUPPLY — 15 items
CATH INFINITI 5 FR JL3.5 (CATHETERS) ×2 IMPLANT
CATH INFINITI 5FR ANG PIGTAIL (CATHETERS) IMPLANT
CATH INFINITI 5FR MULTPACK ANG (CATHETERS) IMPLANT
CATH INFINITI JR4 5F (CATHETERS) ×2 IMPLANT
DEVICE RAD COMP TR BAND LRG (VASCULAR PRODUCTS) ×2 IMPLANT
GLIDESHEATH SLEND SS 6F .021 (SHEATH) ×2 IMPLANT
KIT HEART LEFT (KITS) ×2 IMPLANT
PACK CARDIAC CATHETERIZATION (CUSTOM PROCEDURE TRAY) ×2 IMPLANT
SHEATH PINNACLE 5F 10CM (SHEATH) IMPLANT
SYR MEDRAD MARK V 150ML (SYRINGE) IMPLANT
TRANSDUCER W/STOPCOCK (MISCELLANEOUS) ×2 IMPLANT
TUBING CIL FLEX 10 FLL-RA (TUBING) ×2 IMPLANT
WIRE EMERALD 3MM-J .035X150CM (WIRE) IMPLANT
WIRE HI TORQ VERSACORE-J 145CM (WIRE) ×2 IMPLANT
WIRE SAFE-T 1.5MM-J .035X260CM (WIRE) ×2 IMPLANT

## 2015-05-14 NOTE — H&P (View-Only) (Signed)
Cardiology Office Note   Date:  04/20/2015   ID:  Daja, Shuping September 20, 1961, MRN [id number]  PCP:  Curly Rim, MD  Cardiologist:  [name], MD    Chief Complaint  Patient presents with  . Chest Pain    History of Present Illness: Christina REZNICK is a 54 y.o. female who presents for follow-up evaluation.  The patient has a history of chest pain. Cardiovascular risk factors include family history of premature CAD, tobacco abuse, and hyperlipidemia. She had a stress Myoview scan in 2013 demonstrating no ischemia.  She complains of a feeling of 'indigestion' with exercise. She has not had this with rest. This has been present for 2 weeks. She's taken mylanta and this improves her discomfort. Complains of generalized fatigue but denies shortness of breath. Symptoms also improved with rest. She denies shortness of breath, edema, lightheadedness, or syncope.  Past Medical History  Diagnosis Date  . 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 This was associated with  Subsequent to that time she has had repeat laparoscopies for chronic, particularly right sided, pelvic pain on two occasions, one in 2000 and again in June of 2003.  At each time, pelvic adhesions were noted but no other significant pelvic pathology  . Ulcer disease     currently asymptomatic and not on medication  . Other and unspecified ovarian cysts     long history of recurring ovarian cysts  . Hyperlipidemia   . Thyroid disease   . Right bundle branch block   . Hypercholesterolemia   . Tobacco abuse   . 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 Hypothyroidism   . GERD (gastroesophageal reflux disease)     Past Surgical History  Procedure Laterality Date  . Total abdominal hysterectomy w/ bilateral salpingoophorectomy  1995  . Ovarian cyst removal   1995  . Appendectomy      for obliteration of the appendiceal tip  . Tubal ligation  1989  . Breast biopsy  02/1992    biopsy of benign breast mass  . Total thyroidectomy  05/13/2004    Multiple thyroid nodules, dominant mass, left thyroid lobe -- by, Earnstine Regal, M.D  . Esophagogastroduodenoscopy  12/06/2005    Normal esophagogastroduodenoscopy --  Earle Gell, M.D.    Current Outpatient Prescriptions  Medication Sig Dispense Refill  . Acetaminophen-Caffeine (EXCEDRIN TENSION HEADACHE PO) Take 1-2 tablets by mouth as needed. headache    . aspirin 81 MG tablet Take 81 mg by mouth daily.    . calcium carbonate (OS-CAL) 600 MG TABS Take 600 mg by mouth 2 (two) times daily with a meal.      . estradiol (ESTRACE) 2 MG tablet Take 2 mg by mouth Daily.     Marland Kitchen NITROSTAT 0.4 MG SL tablet Place 0.4 mg under the tongue.     . simvastatin (ZOCOR) 80 MG tablet TAKE 1 TABLET BY MOUTH AT BEDTIME 30 tablet 11  . SYNTHROID 100 MCG tablet Take 100 mcg by mouth Daily.      No current facility-administered medications for this visit.    Allergies:   Promethazine hcl   Social History:  The patient  reports that she has been smoking Cigarettes.  She has a 9.5 pack-year smoking history. She has never used smokeless tobacco. She reports that she does not drink alcohol  or use illicit drugs.   Family History:  The patient's  family history includes Asthma in her mother; Diabetes in her mother; Heart attack in her father; Heart disease in her father and another family member; Hyperlipidemia in an other family member.    ROS:  Please see the history of present illness.  Otherwise, review of systems is positive for back pain, headaches.  All other systems are reviewed and negative.    PHYSICAL EXAM: VS:  BP 110/84 mmHg  Pulse 72  Ht 5\' 2"  (1.575 m)  Wt 126 lb 12.8 oz (57.516 kg)  BMI 23.19 kg/m2 , BMI Body mass index is 23.19 kg/(m^2). GEN: Well nourished, well developed, in no acute distress HEENT:  normal Neck: no JVD, no masses. No carotid bruits Cardiac: RRR without murmur or gallop                Respiratory:  clear to auscultation bilaterally, normal work of breathing GI: soft, nontender, nondistended, + BS MS: no deformity or atrophy Ext: no pretibial edema, pedal pulses 2+= bilaterally Skin: warm and dry, no rash Neuro:  Strength and sensation are intact Psych: euthymic mood, full affect  EKG:  EKG is ordered today. The ekg ordered today shows normal sinus rhythm with occasional PVC, heart rate 72 bpm, otherwise within normal limits.  Recent Labs: 09/01/2014: ALT 15; BUN 14; Creatinine 0.70; Hemoglobin 13.3; Platelets 303; Potassium 4.5; Sodium 142   Lipid Panel     Component Value Date/Time   CHOL 182 04/18/2014 0743   TRIG 128.0 04/18/2014 0743   HDL 66.20 04/18/2014 0743   CHOLHDL 3 04/18/2014 0743   VLDL 25.6 04/18/2014 0743   LDLCALC 90 04/18/2014 0743      Wt Readings from Last 3 Encounters:  04/20/15 126 lb 12.8 oz (57.516 kg)  04/16/14 126 lb (57.153 kg)  01/26/12 128 lb (58.06 kg)    ASSESSMENT AND PLAN: 1.  Hyperlipidemia: The patient continues on simvastatin. Last year's lipids were reviewed. Will repeat when she comes in for her stress test.  2. Tobacco abuse: She quit temporarily with the help of Chantix. She is now back to smoking because of stresses at home. Cessation counseling was done.  3. Heart palpitations: These continue without significant change.  4. Exertional chest pain: Symptoms are concerning for the interval development of angina. She has multiple cardiac risk factors. I have recommended an exercise Myoview stress test. She was advised to continue on aspirin 81 mg daily.  Current medicines are reviewed with the patient today.  The patient does not have concerns regarding medicines.  Labs/ tests ordered today include:   Orders Placed This Encounter  Procedures  . Lipid panel  . Hepatic function panel  . Myocardial Perfusion  Imaging  . EKG 12-Lead    Disposition:   FU one year unless stress test abnormal  Signed, [name], MD  04/20/2015 6:02 PM    [facility] [place], [place], [place]  [address] Phone: [phone]; Fax: [phone]

## 2015-05-14 NOTE — Interval H&P Note (Signed)
History and Physical Interval Note:  05/14/2015 7:55 AM  Christina Watts  has presented today for surgery, with the diagnosis of * No pre-op diagnosis entered *  The various methods of treatment have been discussed with the patient and family. After consideration of risks, benefits and other options for treatment, the patient has consented to  Procedure(s): Left Heart Cath and Coronary Angiography (N/A) as a surgical intervention .  The patient's history has been reviewed, patient examined, no change in status, stable for surgery.  I have reviewed the patient's chart and labs.  Questions were answered to the patient's satisfaction.    Cath Lab Visit (complete for each Cath Lab visit)  Clinical Evaluation Leading to the Procedure:   ACS: No.  Non-ACS:    Anginal Classification: CCS III  Anti-ischemic medical therapy: Minimal Therapy (1 class of medications)  Non-Invasive Test Results: Low-risk stress test findings: cardiac mortality <1%/year  Prior CABG: No previous CABG       [name]

## 2015-05-14 NOTE — Discharge Instructions (Signed)
Radial Site Care °Refer to this sheet in the next few weeks. These instructions provide you with information on caring for yourself after your procedure. Your caregiver may also give you more specific instructions. Your treatment has been planned according to current medical practices, but problems sometimes occur. Call your caregiver if you have any problems or questions after your procedure. °HOME CARE INSTRUCTIONS °· You may shower the day after the procedure. Remove the bandage (dressing) and gently wash the site with plain soap and water. Gently pat the site dry. °· Do not apply powder or lotion to the site. °· Do not submerge the affected site in water for 3 to 5 days. °· Inspect the site at least twice daily. °· Do not flex or bend the affected arm for 24 hours. °· No lifting over 5 pounds (2.3 kg) for 5 days after your procedure. °· Do not drive home if you are discharged the same day of the procedure. Have someone else drive you. °· You may drive 24 hours after the procedure unless otherwise instructed by your caregiver. °· Do not operate machinery or power tools for 24 hours. °· A responsible adult should be with you for the first 24 hours after you arrive home. °What to expect: °· Any bruising will usually fade within 1 to 2 weeks. °· Blood that collects in the tissue (hematoma) may be painful to the touch. It should usually decrease in size and tenderness within 1 to 2 weeks. °SEEK IMMEDIATE MEDICAL CARE IF: °· You have unusual pain at the radial site. °· You have redness, warmth, swelling, or pain at the radial site. °· You have drainage (other than a small amount of blood on the dressing). °· You have chills. °· You have a fever or persistent symptoms for more than 72 hours. °· You have a fever and your symptoms suddenly get worse. °· Your arm becomes pale, cool, tingly, or numb. °· You have heavy bleeding from the site. Hold pressure on the site and call 911. °Document Released: 01/14/2011 Document  Revised: 03/05/2012 Document Reviewed: 01/14/2011 °[employer]® Patient Information ©2015 [employer]. This information is not intended to replace advice given to you by your health care provider. Make sure you discuss any questions you have with your health care provider. ° °

## 2015-05-15 MED FILL — Heparin Sodium (Porcine) 2 Unit/ML in Sodium Chloride 0.9%: INTRAMUSCULAR | Qty: 1000 | Status: AC

## 2015-05-15 MED FILL — Lidocaine HCl Local Preservative Free (PF) Inj 1%: INTRAMUSCULAR | Qty: 30 | Status: AC

## 2015-08-04 ENCOUNTER — Other Ambulatory Visit: Payer: Self-pay | Admitting: Obstetrics & Gynecology

## 2015-08-05 LAB — CYTOLOGY - PAP

## 2016-03-31 DIAGNOSIS — Z85820 Personal history of malignant melanoma of skin: Secondary | ICD-10-CM | POA: Diagnosis not present

## 2016-03-31 DIAGNOSIS — L821 Other seborrheic keratosis: Secondary | ICD-10-CM | POA: Diagnosis not present

## 2016-03-31 DIAGNOSIS — L570 Actinic keratosis: Secondary | ICD-10-CM | POA: Diagnosis not present

## 2016-04-30 ENCOUNTER — Other Ambulatory Visit: Payer: Self-pay | Admitting: Cardiovascular Disease

## 2016-05-18 DIAGNOSIS — L820 Inflamed seborrheic keratosis: Secondary | ICD-10-CM | POA: Diagnosis not present

## 2016-05-28 ENCOUNTER — Other Ambulatory Visit: Payer: Self-pay | Admitting: Cardiovascular Disease

## 2016-06-13 ENCOUNTER — Ambulatory Visit (INDEPENDENT_AMBULATORY_CARE_PROVIDER_SITE_OTHER): Payer: BLUE CROSS/BLUE SHIELD | Admitting: Cardiovascular Disease

## 2016-06-13 ENCOUNTER — Encounter: Payer: Self-pay | Admitting: Cardiovascular Disease

## 2016-06-13 VITALS — BP 140/88 | HR 73 | Ht 62.0 in | Wt 126.6 lb

## 2016-06-13 DIAGNOSIS — E785 Hyperlipidemia, unspecified: Secondary | ICD-10-CM

## 2016-06-13 NOTE — Patient Instructions (Signed)
Medication Instructions:  Your physician recommends that you continue on your current medications as directed. Please refer to the Current Medication list given to you today.  Labwork: Your physician recommends that you return for a FASTING LIPID and CMP--nothing to eat or drink after midnight, lab opens at 7:30 AM  Testing/Procedures: No new orders.   Follow-Up: Your physician wants you to follow-up in: 1 YEAR with Dr Cooper.  You will receive a reminder letter in the mail two months in advance. If you don't receive a letter, please call our office to schedule the follow-up appointment.   Any Other Special Instructions Will Be Listed Below (If Applicable).     If you need a refill on your cardiac medications before your next appointment, please call your pharmacy.   

## 2016-06-13 NOTE — Progress Notes (Signed)
Cardiology Office Note Date:  06/13/2016   ID:  Linde, Wilensky March 04, 1961, MRN [id number]  PCP:  Tereasa Coop, PA-C  Cardiologist:  [name], MD    Chief Complaint  Patient presents with  . Edema    History of Present Illness: Christina Watts is a 55 y.o. female who presents for follow-up evaluation. The patient has a history of chest pain. Cardiovascular risk factors include family history of premature CAD, tobacco abuse, and hyperlipidemia. She underwent a nuclear stress test last year demonstrating inferoseptal ischemia and cardiac catheterization was recommended. This demonstrated normal coronary arteries and noncardiac symptoms were suspected.  The patient is doing well. She complains of leg swelling during the Summer months. Otherwise feels good. She's been playing golf. Today, she denies symptoms of palpitations, chest pain, shortness of breath, orthopnea, PND, dizziness, or syncope. She has started smoking again.   Past Medical History  Diagnosis Date  . 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 This was associated with  Subsequent to that time she has had repeat laparoscopies for chronic, particularly right sided, pelvic pain on two occasions, one in 2000 and again in June of 2003.  At each time, pelvic adhesions were noted but no other significant pelvic pathology  . Ulcer disease     currently asymptomatic and not on medication  . Other and unspecified ovarian cysts     long history of recurring ovarian cysts  . Hyperlipidemia   . Thyroid disease   . Right bundle branch block   . Hypercholesterolemia   . Tobacco abuse   . 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 Hypothyroidism   . GERD (gastroesophageal reflux disease)     Past Surgical History  Procedure Laterality Date  . Total abdominal hysterectomy  w/ bilateral salpingoophorectomy  1995  . Ovarian cyst removal  1995  . Appendectomy      for obliteration of the appendiceal tip  . Tubal ligation  1989  . Breast biopsy  02/1992    biopsy of benign breast mass  . Total thyroidectomy  05/13/2004    Multiple thyroid nodules, dominant mass, left thyroid lobe -- by, Earnstine Regal, M.D  . Esophagogastroduodenoscopy  12/06/2005    Normal esophagogastroduodenoscopy --  Earle Gell, M.D.  . Cardiac catheterization N/A 05/14/2015    Procedure: Left Heart Cath and Coronary Angiography;  Surgeon: [name], MD;  Location: [facility];  Service: Cardiovascular;  Laterality: N/A;    Current Outpatient Prescriptions  Medication Sig Dispense Refill  . Acetaminophen-Caffeine (EXCEDRIN TENSION HEADACHE PO) Take 1-2 tablets by mouth as needed. headache    . aspirin 81 MG tablet Take 81 mg by mouth daily.    . calcium carbonate (OS-CAL) 600 MG TABS Take 600 mg by mouth daily with breakfast.     . estradiol (ESTRACE) 2 MG tablet Take 2 mg by mouth 2 (two) times daily.     . nitroGLYCERIN (NITROSTAT) 0.4 MG SL tablet Place 1 tablet (0.4 mg total) under the tongue every 5 (five) minutes as needed for chest pain. 25 tablet 2  . ranitidine (ZANTAC) 75 MG tablet Take 75 mg by mouth 2 (two) times daily.    . simvastatin (ZOCOR) 80 MG tablet TAKE 1 TABLET BY MOUTH AT BEDTIME (NEED APPOINTMENT) 30 tablet 0  . SYNTHROID 100 MCG  tablet Take 100 mcg by mouth Daily.      No current facility-administered medications for this visit.    Allergies:   Promethazine hcl   Social History:  The patient  reports that she has been smoking Cigarettes.  She has a 9.5 pack-year smoking history. She has never used smokeless tobacco. She reports that she does not drink alcohol or use illicit drugs.   Family History:  The patient's family history includes Asthma in her mother; Diabetes in her mother; Heart attack in her father; Heart disease in her father.    ROS:   Please see the history of present illness.  Otherwise, review of systems is positive for headaches, anxiety.  All other systems are reviewed and negative.    PHYSICAL EXAM: VS:  BP 140/88 mmHg  Pulse 73  Ht '5\' 2"'  (1.575 m)  Wt 126 lb 9.6 oz (57.425 kg)  BMI 23.15 kg/m2  SpO2 98% , BMI Body mass index is 23.15 kg/(m^2). GEN: Well nourished, well developed, in no acute distress HEENT: normal Neck: no JVD, no masses. No carotid bruits Cardiac: RRR without murmur or gallop                Respiratory:  clear to auscultation bilaterally, normal work of breathing GI: soft, nontender, nondistended, + BS MS: no deformity or atrophy Ext: trace pretibial edema, pedal pulses 2+= bilaterally Skin: warm and dry, no rash Neuro:  Strength and sensation are intact Psych: euthymic mood, full affect  EKG:  EKG is ordered today. The ekg ordered today shows NSR 73, incomplete RBBB, PVC's  Recent Labs: No results found for requested labs within last 365 days.   Lipid Panel     Component Value Date/Time   CHOL 156 04/29/2015 1115   TRIG 97.0 04/29/2015 1115   HDL 64.40 04/29/2015 1115   CHOLHDL 2 04/29/2015 1115   VLDL 19.4 04/29/2015 1115   LDLCALC 72 04/29/2015 1115      Wt Readings from Last 3 Encounters:  06/13/16 126 lb 9.6 oz (57.425 kg)  05/14/15 125 lb (56.7 kg)  04/29/15 124 lb (56.246 kg)     Cardiac Studies Reviewed: Coronary Findings    Dominance: Right   Left Anterior Descending  The vessel is angiographically normal.     Left Circumflex  The vessel is angiographically normal.     Right Coronary Artery  The vessel is angiographically normal.      Coronary Diagrams    Diagnostic Diagram               ASSESSMENT AND PLAN: 1.  Chest pain - resolved. Normal coronaries last year at cardiac catheterization.  2. Hyperlipidemia: treated with simvastatin 80 mg. Check lipids, consider decrease to 40 mg pending lab results.   3. Tobacco abuse: cessation  counseling done.   Current medicines are reviewed with the patient today.  The patient does not have concerns regarding medicines.  Labs/ tests ordered today include:   Orders Placed This Encounter  Procedures  . Lipid panel  . Comp Met (CMET)  . EKG 12-Lead   Disposition:   FU one year  Signed, [name], MD  06/13/2016 4:55 PM    [facility] [address], [place], [place]  [address] Phone: [phone]; Fax: [phone]

## 2016-06-15 ENCOUNTER — Other Ambulatory Visit (INDEPENDENT_AMBULATORY_CARE_PROVIDER_SITE_OTHER): Payer: BLUE CROSS/BLUE SHIELD | Admitting: *Deleted

## 2016-06-15 DIAGNOSIS — E785 Hyperlipidemia, unspecified: Secondary | ICD-10-CM | POA: Diagnosis not present

## 2016-06-15 LAB — COMPREHENSIVE METABOLIC PANEL
ALT: 13 U/L (ref 6–29)
AST: 18 U/L (ref 10–35)
Albumin: 3.4 g/dL — ABNORMAL LOW (ref 3.6–5.1)
Alkaline Phosphatase: 32 U/L — ABNORMAL LOW (ref 33–130)
BUN: 17 mg/dL (ref 7–25)
CO2: 27 mmol/L (ref 20–31)
Calcium: 7.9 mg/dL — ABNORMAL LOW (ref 8.6–10.4)
Chloride: 103 mmol/L (ref 98–110)
Creat: 0.74 mg/dL (ref 0.50–1.05)
Glucose, Bld: 79 mg/dL (ref 65–99)
Potassium: 4.4 mmol/L (ref 3.5–5.3)
Sodium: 139 mmol/L (ref 135–146)
Total Bilirubin: 0.3 mg/dL (ref 0.2–1.2)
Total Protein: 5.8 g/dL — ABNORMAL LOW (ref 6.1–8.1)

## 2016-06-15 LAB — LIPID PANEL
Cholesterol: 138 mg/dL (ref 125–200)
HDL: 77 mg/dL (ref >=46)
LDL Cholesterol: 38 mg/dL (ref <130)
Total CHOL/HDL Ratio: 1.8 Ratio (ref <=5.0)
Triglycerides: 114 mg/dL (ref <150)
VLDL: 23 mg/dL (ref <30)

## 2016-06-15 NOTE — Addendum Note (Signed)
Addended by: Eulis Foster on: 06/15/2016 07:31 AM   Modules accepted: Orders

## 2016-06-20 ENCOUNTER — Telehealth: Payer: Self-pay | Admitting: *Deleted

## 2016-06-20 NOTE — Telephone Encounter (Signed)
Received call transferred from operator and spoke with pt. She is calling about lab results. I told pt Dr. Burt Knack hadn't reviewed yet and that we would call her once reviewed.  She needs  medicine refill sent in but is waiting to see if change will be made.  Has enough medication to last through Wednesday.

## 2016-06-21 NOTE — Telephone Encounter (Signed)
Pt stable on current rx. Labs reviewed. Continue same.

## 2016-06-22 MED ORDER — SIMVASTATIN 80 MG PO TABS: ORAL_TABLET | ORAL | Status: DC

## 2016-06-22 MED ORDER — NITROGLYCERIN 0.4 MG SL SUBL: 0.4000 mg | SUBLINGUAL_TABLET | SUBLINGUAL | Status: DC | PRN

## 2016-06-22 NOTE — Telephone Encounter (Signed)
I spoke with pt and reviewed lab results with her. Will send refills for Simvastatin and NTG to [facility] [address].

## 2016-07-01 ENCOUNTER — Other Ambulatory Visit: Payer: Self-pay

## 2016-07-01 NOTE — Telephone Encounter (Signed)
simvastatin (ZOCOR) 80 MG tablet  Medication   Date: 06/22/2016  Department: [facility]  Ordering/Authorizing: [name], MD      Order Providers    Prescribing Provider Encounter Provider   [name], MD Thompson Grayer, RN    Medication Detail      Disp Refills Start End     simvastatin (ZOCOR) 80 MG tablet 30 tablet 11 06/22/2016     Sig: TAKE 1 TABLET BY MOUTH AT BEDTIME    E-Prescribing Status: Receipt confirmed by pharmacy (06/22/2016 8:11 AM EDT)     Pharmacy    [id number] - [place], [place] already on file

## 2016-07-04 ENCOUNTER — Encounter (HOSPITAL_BASED_OUTPATIENT_CLINIC_OR_DEPARTMENT_OTHER): Payer: Self-pay | Admitting: *Deleted

## 2016-07-04 ENCOUNTER — Emergency Department (HOSPITAL_BASED_OUTPATIENT_CLINIC_OR_DEPARTMENT_OTHER)
Admission: EM | Admit: 2016-07-04 | Discharge: 2016-07-04 | Disposition: A | Discharge status: Home / Self Care | Payer: BLUE CROSS/BLUE SHIELD | Attending: Emergency Medicine | Admitting: Emergency Medicine

## 2016-07-04 ENCOUNTER — Emergency Department (HOSPITAL_BASED_OUTPATIENT_CLINIC_OR_DEPARTMENT_OTHER): Payer: BLUE CROSS/BLUE SHIELD

## 2016-07-04 DIAGNOSIS — E039 Hypothyroidism, unspecified: Secondary | ICD-10-CM | POA: Insufficient documentation

## 2016-07-04 DIAGNOSIS — F17210 Nicotine dependence, cigarettes, uncomplicated: Secondary | ICD-10-CM | POA: Diagnosis not present

## 2016-07-04 DIAGNOSIS — Z7982 Long term (current) use of aspirin: Secondary | ICD-10-CM | POA: Insufficient documentation

## 2016-07-04 DIAGNOSIS — R05 Cough: Secondary | ICD-10-CM | POA: Diagnosis not present

## 2016-07-04 DIAGNOSIS — R059 Cough, unspecified: Secondary | ICD-10-CM

## 2016-07-04 DIAGNOSIS — R0789 Other chest pain: Secondary | ICD-10-CM | POA: Diagnosis not present

## 2016-07-04 LAB — CBC
HCT: 42 % (ref 36.0–46.0)
Hemoglobin: 13.9 g/dL (ref 12.0–15.0)
MCH: 31.3 pg (ref 26.0–34.0)
MCHC: 33.1 g/dL (ref 30.0–36.0)
MCV: 94.6 fL (ref 78.0–100.0)
Platelets: 319 10*3/uL (ref 150–400)
RBC: 4.44 MIL/uL (ref 3.87–5.11)
RDW: 11.7 % (ref 11.5–15.5)
WBC: 11 10*3/uL — ABNORMAL HIGH (ref 4.0–10.5)

## 2016-07-04 LAB — BASIC METABOLIC PANEL
Anion gap: 9 (ref 5–15)
BUN: 27 mg/dL — ABNORMAL HIGH (ref 6–20)
CO2: 26 mmol/L (ref 22–32)
Calcium: 8.3 mg/dL — ABNORMAL LOW (ref 8.9–10.3)
Chloride: 100 mmol/L — ABNORMAL LOW (ref 101–111)
Creatinine, Ser: 0.64 mg/dL (ref 0.44–1.00)
GFR calc Af Amer: >60 mL/min (ref >60)
GFR calc non Af Amer: >60 mL/min (ref >60)
Glucose, Bld: 96 mg/dL (ref 65–99)
Potassium: 4 mmol/L (ref 3.5–5.1)
Sodium: 135 mmol/L (ref 135–145)

## 2016-07-04 LAB — TROPONIN I
Troponin I: <0.03 ng/mL (ref <0.03)
Troponin I: <0.03 ng/mL (ref <0.03)

## 2016-07-04 MED ORDER — PREDNISONE 20 MG PO TABS: 60.0000 mg | ORAL_TABLET | Freq: Every day | ORAL | Status: DC

## 2016-07-04 MED ORDER — PREDNISONE 50 MG PO TABS
60.0000 mg | ORAL_TABLET | Freq: Once | ORAL | Status: AC
  Administered 2016-07-04: 60 mg via ORAL
  Filled 2016-07-04: qty 1

## 2016-07-04 MED ORDER — AZITHROMYCIN 250 MG PO TABS: 250.0000 mg | ORAL_TABLET | Freq: Every day | ORAL | Status: DC

## 2016-07-04 MED ORDER — ALBUTEROL SULFATE HFA 108 (90 BASE) MCG/ACT IN AERS: 1.0000 | INHALATION_SPRAY | Freq: Four times a day (QID) | RESPIRATORY_TRACT | Status: DC | PRN

## 2016-07-04 NOTE — ED Notes (Signed)
Cough x 1 week.  Pt reports that she thinks that she has bronchitis.  Denies fever

## 2016-07-04 NOTE — ED Provider Notes (Signed)
CSN: [id number]     Arrival date & time 07/04/16  1615 History  By signing my name below, I, Soijett Blue, attest that this documentation has been prepared under the direction and in the presence of S. Wendie Simmer, PA-C Electronically Signed: [place], ED Scribe. 07/04/2016. 4:52 PM.    Chief Complaint  Patient presents with  . Cough      The history is provided by the patient. No language interpreter was used.    Christina Watts is a 55 y.o. female with a PMHx of hyperlipidemia who presents to the Emergency Department complaining of mildly resolved productive cough onset 1 week. Pt thinks that she has bronchitis, due to her past hx of bronchitis. Denies ever having a heart attack in the past. Pt notes that she has had a heart cath completed last year and she recently saw her cardiologist. She states that she is having associated symptoms of chest pressure sensation. Pt notes that she was sitting at her desk at work when she began to experience intermittent chest pressure. Denies her chest pressure worsening with deep breathing. She states that she has not tried any medications of this relief for her symptoms. She denies SOB, CP, n/v, leg swelling, color change to lower legs, and any other symptoms. Pt notes that she smokes 1 pack of cigarettes q 3 days. Pt former PCP recently retired and her next appointment with her new PCP is at the end of this month. Pt denies recent travel, immobilization, surgery, or PMHx of blood clots.    Past Medical History:  Diagnosis Date  . 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 This was associated with  Subsequent to that time she has had repeat laparoscopies for chronic, particularly right sided, pelvic pain on two occasions, one in 2000 and again in June of 2003.  At each time, pelvic adhesions were noted but no other significant pelvic pathology  . Adenomyosis    uterine adenomyosis, in 1995 total abdominal  hysterectomy, right ovarian cystectomy  . GERD (gastroesophageal reflux disease)   . Hypercholesterolemia   . Hyperlipidemia   . Hypothyroidism   . Other and unspecified ovarian cysts    long history of recurring ovarian cysts  . Right bundle branch block   . Thyroid disease   . Tobacco abuse   . 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 Ulcer disease    currently asymptomatic and not on medication   Past Surgical History:  Procedure Laterality Date  . APPENDECTOMY     for obliteration of the appendiceal tip  . BREAST BIOPSY  02/1992   biopsy of benign breast mass  . CARDIAC CATHETERIZATION N/A 05/14/2015   Procedure: Left Heart Cath and Coronary Angiography;  Surgeon: [name], MD;  Location: [facility];  Service: Cardiovascular;  Laterality: N/A;  . ESOPHAGOGASTRODUODENOSCOPY  12/06/2005   Normal esophagogastroduodenoscopy --  Earle Gell, M.D.  . OVARIAN CYST REMOVAL  1995  . TOTAL ABDOMINAL HYSTERECTOMY W/ BILATERAL SALPINGOOPHORECTOMY  1995  . TOTAL THYROIDECTOMY  05/13/2004   Multiple thyroid nodules, dominant mass, left thyroid lobe -- by, Earnstine Regal, M.D  . TUBAL LIGATION  1989   Family History  Problem Relation Age of Onset  . Heart disease Father   . Asthma Mother   . Diabetes Mother   . Heart disease      fathers side has a strong history of heart disease  . Hyperlipidemia  strong family history of  . Heart attack Father    Social History  Substance Use Topics  . Smoking status: Current Every Day Smoker    Packs/day: 0.50    Years: 19.00    Types: Cigarettes    Last attempt to quit: 05/09/2011  . Smokeless tobacco: Never Used  . Alcohol use No   OB History    No data available     Review of Systems  A complete 10 system review of systems was obtained and all systems are negative except as noted in the HPI and PMH.   Allergies  Promethazine hcl  Home Medications   Prior to Admission medications   Medication Sig  Start Date End Date Taking? Authorizing Provider  Acetaminophen-Caffeine (EXCEDRIN TENSION HEADACHE PO) Take 1-2 tablets by mouth as needed. headache    Historical Provider, MD  albuterol (PROVENTIL HFA;VENTOLIN HFA) 108 (90 Base) MCG/ACT inhaler Inhale 1-2 puffs into the lungs every 6 (six) hours as needed for wheezing or shortness of breath. 07/04/16   Earlston Lions, PA-C  aspirin 81 MG tablet Take 81 mg by mouth daily.    Historical Provider, MD  azithromycin (ZITHROMAX) 250 MG tablet Take 1 tablet (250 mg total) by mouth daily. Take first 2 tablets together, then 1 every day until finished. 07/04/16   [place] Lions, PA-C  calcium carbonate (OS-CAL) 600 MG TABS Take 600 mg by mouth daily with breakfast.     Historical Provider, MD  estradiol (ESTRACE) 2 MG tablet Take 2 mg by mouth 2 (two) times daily.  05/12/11   Historical Provider, MD  nitroGLYCERIN (NITROSTAT) 0.4 MG SL tablet Place 1 tablet (0.4 mg total) under the tongue every 5 (five) minutes as needed for chest pain. 06/22/16   [name], MD  predniSONE (DELTASONE) 20 MG tablet Take 3 tablets (60 mg total) by mouth daily. 07/04/16   Sylva Lions, PA-C  ranitidine (ZANTAC) 75 MG tablet Take 75 mg by mouth 2 (two) times daily.    Historical Provider, MD  simvastatin (ZOCOR) 80 MG tablet TAKE 1 TABLET BY MOUTH AT BEDTIME 06/22/16   [name], MD  SYNTHROID 100 MCG tablet Take 100 mcg by mouth Daily.  05/20/11   Historical Provider, MD   BP 123/73 (BP Location: Right Arm)   Pulse 72   Temp 98 F (36.7 C) (Oral)   Resp 16   Ht 5\' 2"  (1.575 m)   Wt 56.7 kg   SpO2 99%   BMI 22.86 kg/m  Physical Exam  Constitutional: She is oriented to person, place, and time. She appears well-developed and well-nourished. No distress.  HENT:  Head: Normocephalic and atraumatic.  Right Ear: Tympanic membrane, external ear and ear canal normal.  Left Ear: Tympanic membrane, external ear and ear canal normal.  Mouth/Throat:  Uvula is midline, oropharynx is clear and moist and mucous membranes are normal.  Eyes: EOM are normal.  Neck: Neck supple.  Cardiovascular: Normal rate, regular rhythm and normal heart sounds.  Exam reveals no gallop and no friction rub.   No murmur heard. Pulmonary/Chest: Effort normal and breath sounds normal. No respiratory distress. She has no wheezes. She has no rales.  Abdominal: Soft. She exhibits no distension. There is no tenderness.  Musculoskeletal: Normal range of motion. She exhibits no edema.  No edema or discoloration to bilateral lower legs.  Neurological: She is alert and oriented to person, place, and time.  Skin: Skin is warm and dry.  Psychiatric: She  has a normal mood and affect. Her behavior is normal.  Nursing note and vitals reviewed.   ED Course  Procedures (including critical care time) DIAGNOSTIC STUDIES: Oxygen Saturation is 100% on RA, nl by my interpretation.    COORDINATION OF CARE: 4:51 PM Discussed treatment plan with pt at bedside which includes CXR, EKG, labs, and pt agreed to plan.    Labs Review Labs Reviewed  CBC - Abnormal; Notable for the following:       Result Value   WBC 11.0 (*)    All other components within normal limits  BASIC METABOLIC PANEL - Abnormal; Notable for the following:    Chloride 100 (*)    BUN 27 (*)    Calcium 8.3 (*)    All other components within normal limits  TROPONIN I  TROPONIN I    Imaging Review Dg Chest 2 View  Result Date: 07/04/2016 CLINICAL DATA:  Rule chest pressure for 3 days. EXAM: CHEST  2 VIEW COMPARISON:  Two-view chest x-ray 09/01/2014. FINDINGS: The heart size and mediastinal contours are within normal limits. Both lungs are clear. The visualized skeletal structures are unremarkable. IMPRESSION: Negative two view chest x-ray. Electronically Signed   By: San Morelle M.D.   On: 07/04/2016 16:42    I have personally reviewed and evaluated these images and lab results as part of my  medical decision-making.   EKG Interpretation  Date/Time:  Monday July 04 2016 16:26:58 EDT Ventricular Rate:  70 PR Interval:  134 QRS Duration: 90 QT Interval:  406 QTC Calculation: 438 R Axis:   57 Text Interpretation:  Sinus rhythm with occasional Premature ventricular complexes Otherwise normal ECG Confirmed by YELVERTON  MD, DAVID ([id number]) on 07/04/2016 5:53:15 PM       MDM   Final diagnoses:  Cough   Patient is to be discharged with recommendation to follow up with PCP in regards to today's hospital visit. Chest pain is not likely of cardiac or pulmonary etiology d/t presentation, PERC negative, VSS, no tracheal deviation, no JVD or new murmur, RRR, breath sounds equal bilaterally, EKG without acute abnormalities, negative troponin x 2, and negative CXR. Pt has been advised to return to the ED if CP becomes exertional, associated with diaphoresis or nausea, radiates to left jaw/arm, worsens or becomes concerning in any way. Pt appears reliable for follow up with cardiology (PVCs on EKG) and is agreeable to discharge.   Case has been discussed with Dr. Lita Mains who agrees with the above plan to discharge.    I personally performed the services described in this documentation, which was scribed in my presence. The recorded information has been reviewed and is accurate.   [place] Lions, PA-C 07/18/16 1359  Julianne Rice, MD 07/31/16 [phone]

## 2016-07-04 NOTE — Discharge Instructions (Signed)
[name],  Nice meeting you! Please follow-up with your primary care provider. Return to the emergency department if you develop fevers, chills, increased chest pain, shortness of breath, new/worsening symptoms. Feel better soon!  S. Wendie Simmer, PA-C Cough, Adult A cough helps to clear your throat and lungs. A cough may last only 2-3 weeks (acute), or it may last longer than 8 weeks (chronic). Many different things can cause a cough. A cough may be a sign of an illness or another medical condition. HOME CARE  Pay attention to any changes in your cough.  Take medicines only as told by your doctor.  If you were prescribed an antibiotic medicine, take it as told by your doctor. Do not stop taking it even if you start to feel better.  Talk with your doctor before you try using a cough medicine.  Drink enough fluid to keep your pee (urine) clear or pale yellow.  If the air is dry, use a cold steam vaporizer or humidifier in your home.  Stay away from things that make you cough at work or at home.  If your cough is worse at night, try using extra pillows to raise your head up higher while you sleep.  Do not smoke, and try not to be around smoke. If you need help quitting, ask your doctor.  Do not have caffeine.  Do not drink alcohol.  Rest as needed. GET HELP IF:  You have new problems (symptoms).  You cough up yellow fluid (pus).  Your cough does not get better after 2-3 weeks, or your cough gets worse.  Medicine does not help your cough and you are not sleeping well.  You have pain that gets worse or pain that is not helped with medicine.  You have a fever.  You are losing weight and you do not know why.  You have night sweats. GET HELP RIGHT AWAY IF:  You cough up blood.  You have trouble breathing.  Your heartbeat is very fast.   This information is not intended to replace advice given to you by your health care provider. Make sure you discuss any  questions you have with your health care provider.   Document Released: 08/25/2011 Document Revised: 09/02/2015 Document Reviewed: 02/18/2015 Elsevier Interactive Patient Education [employer].

## 2016-07-13 ENCOUNTER — Encounter: Payer: Self-pay | Admitting: Physician Assistant

## 2016-07-25 ENCOUNTER — Ambulatory Visit (INDEPENDENT_AMBULATORY_CARE_PROVIDER_SITE_OTHER): Payer: BLUE CROSS/BLUE SHIELD | Admitting: Family Medicine

## 2016-07-25 ENCOUNTER — Encounter: Payer: Self-pay | Admitting: Family Medicine

## 2016-07-25 DIAGNOSIS — Z720 Tobacco use: Secondary | ICD-10-CM | POA: Diagnosis not present

## 2016-07-25 DIAGNOSIS — E209 Hypoparathyroidism, unspecified: Secondary | ICD-10-CM | POA: Diagnosis not present

## 2016-07-25 DIAGNOSIS — E890 Postprocedural hypothyroidism: Secondary | ICD-10-CM | POA: Diagnosis not present

## 2016-07-25 NOTE — Patient Instructions (Addendum)
A few things to remember from today's visit:   Hypothyroidism, postsurgical - Plan: TSH, T4, free  Hypoparathyroidism, unspecified ([place]) - Plan: VITAMIN D 25 Hydroxy (Vit-D Deficiency, Fractures), PTH, Intact and Calcium, Calcium, ionized   Smoking cessation continue working on. Calcium 1200 mg daily, ideally through diet.    Please be sure medication list is accurate. If a new problem present, please set up appointment sooner than planned today.

## 2016-07-25 NOTE — Progress Notes (Signed)
HPI:   [name] is a 55 y.o. female, who is here today to establish care with me.  Former PCP: [name], [place] at [facility]. Last preventive routine visit: 12/2014. Christina Watts, gyn managed HT and female preventive care. Hx of GERD, hypothyroidism, recurrent HSV,dermatochalasis both upper lids, and hypoCa++ among some.   Reporting Hx of bronchitis, exacerbations once annually, +smoker, ? COPD. she uses albuterol inhaler as needed. She follows annually with Christina. Christina Watts, cardiologists, for RBBB and HLD.   Concerns today: Ca++ levels.  According to patient, it was recommended to follow with PCP because low calcium. She has history of post surgical hypothyroidism, no malignancy reported by pathology. According to patient, parathyroid glands were also removed during surgery and since then her Ca++ has been low. She has not followed with endocrinologists, she states that her former PCP was managing this problem. She is on Ca++ 600 mg daily, recently she increased it to 900 mg daily.  Denies severe/frequent headache (Hx of tension like HA), visual changes, chest pain, dyspnea, palpitation, claudication, focal weakness, or edema. No muscle spasms , anxiety or depression symptoms. No numbness, tingling, tremor, abdominal pain, nausea, vomiting, or changes in bowel habits.  Lab Results  Component Value Date   CREATININE 0.64 07/04/2016   BUN 27 (H) 07/04/2016   NA 135 07/04/2016   K 4.0 07/04/2016   CL 100 (L) 07/04/2016   CO2 26 07/04/2016     She is not on Vit D supplementation. 12/2014 25 OH vit D 45.4.      Review of Systems  Constitutional: Negative for activity change, appetite change, fatigue, fever and unexpected weight change.  HENT: Negative for mouth sores, nosebleeds and trouble swallowing.   Eyes: Negative for pain, redness and visual disturbance.  Respiratory: Negative for cough, shortness of breath and wheezing.        + Tobacco use.    Cardiovascular: Negative for chest pain, palpitations and leg swelling.  Gastrointestinal: Negative for abdominal pain, nausea and vomiting.       Negative for changes in bowel habits.  Endocrine: Negative for cold intolerance and heat intolerance.  Genitourinary: Negative for decreased urine volume, difficulty urinating, dysuria and hematuria.  Musculoskeletal: Negative for gait problem and myalgias.  Neurological: Negative for tremors, seizures, syncope, weakness, numbness and headaches.  Psychiatric/Behavioral: Negative.  Negative for confusion and hallucinations. The patient is not nervous/anxious.       Current Outpatient Prescriptions on File Prior to Visit  Medication Sig Dispense Refill  . Acetaminophen-Caffeine (EXCEDRIN TENSION HEADACHE PO) Take 1-2 tablets by mouth as needed. headache    . albuterol (PROVENTIL HFA;VENTOLIN HFA) 108 (90 Base) MCG/ACT inhaler Inhale 1-2 puffs into the lungs every 6 (six) hours as needed for wheezing or shortness of breath. 1 Inhaler 0  . aspirin 81 MG tablet Take 81 mg by mouth daily.    . calcium carbonate (OS-CAL) 600 MG TABS Take 600 mg by mouth daily with breakfast.     . estradiol (ESTRACE) 2 MG tablet Take 2 mg by mouth 2 (two) times daily.     . nitroGLYCERIN (NITROSTAT) 0.4 MG SL tablet Place 1 tablet (0.4 mg total) under the tongue every 5 (five) minutes as needed for chest pain. 25 tablet 6  . ranitidine (ZANTAC) 75 MG tablet Take 75 mg by mouth 2 (two) times daily.    . simvastatin (ZOCOR) 80 MG tablet TAKE 1 TABLET BY MOUTH AT BEDTIME 30 tablet 11  .  SYNTHROID 100 MCG tablet Take 100 mcg by mouth Daily.      No current facility-administered medications on file prior to visit.      Past Medical History:  Diagnosis Date  . 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 This was associated with  Subsequent to that time she has had repeat laparoscopies for chronic, particularly right sided, pelvic  pain on two occasions, one in 2000 and again in June of 2003.  At each time, pelvic adhesions were noted but no other significant pelvic pathology  . Adenomyosis    uterine adenomyosis, in 1995 total abdominal hysterectomy, right ovarian cystectomy  . GERD (gastroesophageal reflux disease)   . Hypercholesterolemia   . Hyperlipidemia   . Hypothyroidism   . Other and unspecified ovarian cysts    long history of recurring ovarian cysts  . Right bundle branch block   . Thyroid disease   . Tobacco abuse   . 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 Ulcer disease    currently asymptomatic and not on medication   Allergies  Allergen Reactions  . Promethazine Hcl Anaphylaxis, Hives and Other (See Comments)    Lungs swell and cant breathe    Family History  Problem Relation Age of Onset  . Heart disease Father   . Asthma Mother   . Diabetes Mother   . Heart disease      fathers side has a strong history of heart disease  . Hyperlipidemia      strong family history of  . Heart attack Father     Social History   Social History  . Marital status: Married    Spouse name: N/A  . Number of children: N/A  . Years of education: N/A   Social History Main Topics  . Smoking status: Current Every Day Smoker    Packs/day: 0.50    Years: 19.00    Types: Cigarettes    Last attempt to quit: 05/09/2011  . Smokeless tobacco: Never Used  . Alcohol use No  . Drug use: No  . Sexual activity: Not Asked   Other Topics Concern  . None   Social History Narrative   She is married with three children, and she has five sibling who are all still living.    Vitals:   07/25/16 1455  BP: 134/78  Pulse: 72  Resp: 16  Temp: 98.3 F (36.8 C)    Body mass index is 23.23 kg/m.   O2 sat 97% RA.   Physical Exam  Constitutional: She is oriented to person, place, and time. She appears well-developed and well-nourished. No distress.  HENT:  Head: Atraumatic.  Mouth/Throat:  Oropharynx is clear and moist and mucous membranes are normal.  Eyes: Conjunctivae and EOM are normal. Pupils are equal, round, and reactive to light.  Neck: No tracheal deviation present. No thyroid mass and no thyromegaly present.  Cardiovascular: Normal rate and regular rhythm.   No murmur heard. Pulses:      Posterior tibial pulses are 2+ on the right side, and 2+ on the left side.  Respiratory: Effort normal and breath sounds normal. No respiratory distress.  GI: Soft. She exhibits no mass. There is no tenderness.  Musculoskeletal: She exhibits no edema.  Lymphadenopathy:    She has no cervical adenopathy.  Neurological: She is alert and oriented to person, place, and time. She has normal strength. No cranial nerve deficit. Coordination and gait normal.  Skin: Skin is  warm. No erythema.  Psychiatric: She has a normal mood and affect.  Well groomed, good eye contact.      ASSESSMENT AND PLAN:     Miaisabella was seen today for new patient (initial visit).  Diagnoses and all orders for this visit:  Hypothyroidism, postsurgical  No changes in current management, will follow labs done today and will give further recommendations accordingly.  -     TSH -     T4, free   Hypocalcemia:  Based on labs done on 06/15/16 , corrected Ca++ 8.3. Recommend taking Ca++ 1200 mg daily, ideally through diet.   Hypoparathyroidism, unspecified ([place])  Further recommendations would be given according to lab results.   -     VITAMIN D 25 Hydroxy (Vit-D Deficiency, Fractures) -     PTH, Intact and Calcium -     Calcium, ionized -     Phosphorus  Tobacco abuse disorder  She is not interested in pharmacologic treatment, continue decreasing smoking status the recommended. Adverse effects of tobacco discussed.     -Planning on CPE in January 2018, she was instructed to follow up sooner if needed.           Betty G. Martinique, MD  [facility] [place]  office.

## 2016-07-25 NOTE — Progress Notes (Signed)
Pre visit review using our clinic review tool, if applicable. No additional management support is needed unless otherwise documented below in the visit note. 

## 2016-07-26 LAB — CALCIUM, IONIZED: Calcium, Ion: 4.9 mg/dL (ref 4.8–5.6)

## 2016-07-26 LAB — PHOSPHORUS: Phosphorus: 3.5 mg/dL (ref 2.3–4.6)

## 2016-07-26 LAB — TSH: TSH: 0.72 u[IU]/mL (ref 0.35–4.50)

## 2016-07-26 LAB — PTH, INTACT AND CALCIUM
Calcium: 8.4 mg/dL (ref 8.4–10.5)
PTH: 13 pg/mL — ABNORMAL LOW (ref 14–64)

## 2016-07-26 LAB — T4, FREE: Free T4: 0.95 ng/dL (ref 0.60–1.60)

## 2016-07-26 LAB — VITAMIN D 25 HYDROXY (VIT D DEFICIENCY, FRACTURES): VITD: 59.09 ng/mL (ref 30.00–100.00)

## 2016-07-28 ENCOUNTER — Encounter: Payer: Self-pay | Admitting: Family Medicine

## 2016-08-10 DIAGNOSIS — Z6822 Body mass index (BMI) 22.0-22.9, adult: Secondary | ICD-10-CM | POA: Diagnosis not present

## 2016-08-10 DIAGNOSIS — Z1231 Encounter for screening mammogram for malignant neoplasm of breast: Secondary | ICD-10-CM | POA: Diagnosis not present

## 2016-08-10 DIAGNOSIS — Z01419 Encounter for gynecological examination (general) (routine) without abnormal findings: Secondary | ICD-10-CM | POA: Diagnosis not present

## 2016-09-06 DIAGNOSIS — S6391XA Sprain of unspecified part of right wrist and hand, initial encounter: Secondary | ICD-10-CM | POA: Diagnosis not present

## 2016-09-08 DIAGNOSIS — R1031 Right lower quadrant pain: Secondary | ICD-10-CM | POA: Diagnosis not present

## 2016-09-08 DIAGNOSIS — K5790 Diverticulosis of intestine, part unspecified, without perforation or abscess without bleeding: Secondary | ICD-10-CM | POA: Diagnosis not present

## 2016-09-29 DIAGNOSIS — D235 Other benign neoplasm of skin of trunk: Secondary | ICD-10-CM | POA: Diagnosis not present

## 2016-09-29 DIAGNOSIS — Z85820 Personal history of malignant melanoma of skin: Secondary | ICD-10-CM | POA: Diagnosis not present

## 2016-09-29 DIAGNOSIS — L814 Other melanin hyperpigmentation: Secondary | ICD-10-CM | POA: Diagnosis not present

## 2016-09-29 DIAGNOSIS — L570 Actinic keratosis: Secondary | ICD-10-CM | POA: Diagnosis not present

## 2016-11-23 DIAGNOSIS — J370 Chronic laryngitis: Secondary | ICD-10-CM | POA: Diagnosis not present

## 2016-11-23 DIAGNOSIS — J3501 Chronic tonsillitis: Secondary | ICD-10-CM | POA: Diagnosis not present

## 2016-11-23 DIAGNOSIS — J322 Chronic ethmoidal sinusitis: Secondary | ICD-10-CM | POA: Diagnosis not present

## 2016-11-23 DIAGNOSIS — J320 Chronic maxillary sinusitis: Secondary | ICD-10-CM | POA: Diagnosis not present

## 2016-12-28 ENCOUNTER — Encounter: Payer: Self-pay | Admitting: Family Medicine

## 2016-12-28 ENCOUNTER — Ambulatory Visit (INDEPENDENT_AMBULATORY_CARE_PROVIDER_SITE_OTHER): Payer: BLUE CROSS/BLUE SHIELD | Admitting: Family Medicine

## 2016-12-28 VITALS — BP 122/80 | HR 71 | Resp 12 | Ht 62.0 in | Wt 128.2 lb

## 2016-12-28 DIAGNOSIS — Z0000 Encounter for general adult medical examination without abnormal findings: Secondary | ICD-10-CM

## 2016-12-28 DIAGNOSIS — Z1159 Encounter for screening for other viral diseases: Secondary | ICD-10-CM

## 2016-12-28 DIAGNOSIS — Z131 Encounter for screening for diabetes mellitus: Secondary | ICD-10-CM | POA: Diagnosis not present

## 2016-12-28 DIAGNOSIS — E209 Hypoparathyroidism, unspecified: Secondary | ICD-10-CM | POA: Diagnosis not present

## 2016-12-28 DIAGNOSIS — Z23 Encounter for immunization: Secondary | ICD-10-CM | POA: Diagnosis not present

## 2016-12-28 DIAGNOSIS — E890 Postprocedural hypothyroidism: Secondary | ICD-10-CM | POA: Diagnosis not present

## 2016-12-28 LAB — COMPREHENSIVE METABOLIC PANEL
ALT: 12 U/L (ref 0–35)
AST: 16 U/L (ref 0–37)
Albumin: 3.8 g/dL (ref 3.5–5.2)
Alkaline Phosphatase: 36 U/L — ABNORMAL LOW (ref 39–117)
BUN: 17 mg/dL (ref 6–23)
CO2: 31 mEq/L (ref 19–32)
Calcium: 8.8 mg/dL (ref 8.4–10.5)
Chloride: 101 mEq/L (ref 96–112)
Creatinine, Ser: 0.67 mg/dL (ref 0.40–1.20)
GFR: 96.46 mL/min (ref >60.00)
Glucose, Bld: 89 mg/dL (ref 70–99)
Potassium: 4.2 mEq/L (ref 3.5–5.1)
Sodium: 139 mEq/L (ref 135–145)
Total Bilirubin: 0.3 mg/dL (ref 0.2–1.2)
Total Protein: 6.1 g/dL (ref 6.0–8.3)

## 2016-12-28 LAB — T4, FREE: Free T4: 0.97 ng/dL (ref 0.60–1.60)

## 2016-12-28 LAB — TSH: TSH: 1.39 u[IU]/mL (ref 0.35–4.50)

## 2016-12-28 MED ORDER — LEVOTHYROXINE SODIUM 100 MCG PO TABS: 100.0000 ug | ORAL_TABLET | Freq: Every day | ORAL | 11 refills | Status: DC

## 2016-12-28 NOTE — Progress Notes (Signed)
Pre visit review using our clinic review tool, if applicable. No additional management support is needed unless otherwise documented below in the visit note. 

## 2016-12-28 NOTE — Progress Notes (Signed)
HPI:   Christina Watts is a 56 y.o. female, who is here today for her routine physical.   She does exercise regularly, daily walking,  and follows a healthy diet.  She lives with husband  Chronic medical problems: Hypothyroidism,hypoparathyroidism (On Ca++ supplementation 1200 mg daily) ,RBBB, GERD,and HLD.  She follows with cardiologists annually.  Lab Results  Component Value Date   CHOL 138 06/15/2016   HDL 77 06/15/2016   LDLCALC 38 06/15/2016   TRIG 114 06/15/2016   CHOLHDL 1.8 06/15/2016    Pap smear and mammogram reported as done in 06/2016, she follows with her gyn annually, Dr Nail.  Colonoscopy: not sure about date but reported as current.  + Smoker, working on smoking cessation.  FHx for gynecologic or colon cancer negative.  She has no new concerns today. She needs refills on Synthroid 100 mcg, which she takes daily.  Hep C screening: Never, denies risk factors.  She also follows with ENT as needed for "sinus" problems.     Review of Systems  Constitutional: Negative for activity change, appetite change, fatigue, fever and unexpected weight change.  HENT: Negative for dental problem, hearing loss, mouth sores, trouble swallowing and voice change.   Eyes: Negative for photophobia, pain and visual disturbance.  Respiratory: Negative for cough, shortness of breath and wheezing.   Cardiovascular: Negative for chest pain, palpitations and leg swelling.  Gastrointestinal: Negative for abdominal pain, nausea and vomiting.       No changes in bowel habits.  Endocrine: Negative for cold intolerance, heat intolerance, polydipsia, polyphagia and polyuria.  Genitourinary: Negative for decreased urine volume, difficulty urinating and hematuria.  Musculoskeletal: Negative for arthralgias, back pain and neck pain.  Skin: Negative for color change and rash.  Allergic/Immunologic: Positive for environmental allergies.  Neurological: Negative for  seizures, syncope, weakness and headaches.  Hematological: Negative for adenopathy. Does not bruise/bleed easily.  Psychiatric/Behavioral: Negative for confusion and sleep disturbance. The patient is not nervous/anxious.   All other systems reviewed and are negative.     Current Outpatient Prescriptions on File Prior to Visit  Medication Sig Dispense Refill  . Acetaminophen-Caffeine (EXCEDRIN TENSION HEADACHE PO) Take 1-2 tablets by mouth as needed. headache    . albuterol (PROVENTIL HFA;VENTOLIN HFA) 108 (90 Base) MCG/ACT inhaler Inhale 1-2 puffs into the lungs every 6 (six) hours as needed for wheezing or shortness of breath. 1 Inhaler 0  . aspirin 81 MG tablet Take 81 mg by mouth daily.    . calcium carbonate (OS-CAL) 600 MG TABS Take 600 mg by mouth daily with breakfast.     . estradiol (ESTRACE) 2 MG tablet Take 2 mg by mouth 2 (two) times daily.     . nitroGLYCERIN (NITROSTAT) 0.4 MG SL tablet Place 1 tablet (0.4 mg total) under the tongue every 5 (five) minutes as needed for chest pain. 25 tablet 6  . ranitidine (ZANTAC) 75 MG tablet Take 75 mg by mouth 2 (two) times daily.    . simvastatin (ZOCOR) 80 MG tablet TAKE 1 TABLET BY MOUTH AT BEDTIME 30 tablet 11   No current facility-administered medications on file prior to visit.      Past Medical History:  Diagnosis Date  . 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 This was associated with  Subsequent to that time she has had repeat laparoscopies for chronic, particularly right sided, pelvic pain on two occasions, one in 2000 and again in June of 2003.  At each time, pelvic adhesions were noted but no other significant pelvic pathology  . Adenomyosis    uterine adenomyosis, in 1995 total abdominal hysterectomy, right ovarian cystectomy  . GERD (gastroesophageal reflux disease)   . Hypercholesterolemia   . Hyperlipidemia   . Hypothyroidism   . Other and unspecified ovarian cysts    long  history of recurring ovarian cysts  . Right bundle branch block   . Thyroid disease   . Tobacco abuse   . 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 Ulcer disease    currently asymptomatic and not on medication    Allergies  Allergen Reactions  . Promethazine Hcl Anaphylaxis, Hives and Other (See Comments)    Lungs swell and cant breathe    Family History  Problem Relation Age of Onset  . Heart disease Father   . Asthma Mother   . Diabetes Mother   . Heart disease      fathers side has a strong history of heart disease  . Hyperlipidemia      strong family history of  . Heart attack Father     Social History   Social History  . Marital status: Married    Spouse name: N/A  . Number of children: N/A  . Years of education: N/A   Social History Main Topics  . Smoking status: Current Every Day Smoker    Packs/day: 0.50    Years: 19.00    Types: Cigarettes    Last attempt to quit: 05/09/2011  . Smokeless tobacco: Never Used  . Alcohol use No  . Drug use: No  . Sexual activity: Not Asked   Other Topics Concern  . None   Social History Narrative   She is married with three children, and she has five sibling who are all still living.     Vitals:   12/28/16 0801  BP: 122/80  Pulse: 71  Resp: 12   Body mass index is 23.46 kg/m.  O2 sat at RA 98%  Wt Readings from Last 3 Encounters:  12/28/16 128 lb 4 oz (58.2 kg)  07/25/16 127 lb (57.6 kg)  07/04/16 125 lb (56.7 kg)      Physical Exam  Nursing note and vitals reviewed. Constitutional: She is oriented to person, place, and time. She appears well-developed and well-nourished. No distress.  HENT:  Head: Atraumatic.  Right Ear: Hearing, tympanic membrane, external ear and ear canal normal.  Left Ear: Hearing, tympanic membrane, external ear and ear canal normal.  Mouth/Throat: Uvula is midline, oropharynx is clear and moist and mucous membranes are normal.  Eyes: Conjunctivae and EOM are normal.  Pupils are equal, round, and reactive to light.  Neck: No thyroid mass and no thyromegaly present.  Cardiovascular: Normal rate and regular rhythm.   Occasional extrasystoles are present.  No murmur heard. Pulses:      Dorsalis pedis pulses are 2+ on the right side, and 2+ on the left side.       Posterior tibial pulses are 2+ on the right side, and 2+ on the left side.  Respiratory: Effort normal and breath sounds normal. No respiratory distress.  GI: Soft. She exhibits no mass. There is no hepatomegaly. There is no tenderness.  Musculoskeletal: She exhibits no edema or tenderness.  No major deformity or signs of synovitis appreciated.  Lymphadenopathy:    She has no cervical adenopathy.       Right: No supraclavicular adenopathy present.  Left: No supraclavicular adenopathy present.  Neurological: She is alert and oriented to person, place, and time. She has normal strength. No cranial nerve deficit. Coordination and gait normal.  Reflex Scores:      Bicep reflexes are 2+ on the right side and 2+ on the left side.      Patellar reflexes are 2+ on the right side and 2+ on the left side. Skin: Skin is warm. No rash noted. No erythema.  Psychiatric: She has a normal mood and affect. Her speech is normal. Cognition and memory are normal.  Well groomed, good eye contact.      ASSESSMENT AND PLAN:   Leeonna was seen today for annual exam.  Diagnoses and all orders for this visit:      Chemistry      Component Value Date/Time   NA 139 12/28/2016 0852   NA 141 01/23/2015   K 4.2 12/28/2016 0852   CL 101 12/28/2016 0852   CO2 31 12/28/2016 0852   BUN 17 12/28/2016 0852   BUN 13 01/23/2015   CREATININE 0.67 12/28/2016 0852   CREATININE 0.74 06/15/2016 0731   GLU 72 01/23/2015      Component Value Date/Time   CALCIUM 8.8 12/28/2016 0852   ALKPHOS 36 (L) 12/28/2016 0852   AST 16 12/28/2016 0852   ALT 12 12/28/2016 0852   BILITOT 0.3 12/28/2016 0852     Lab Results    Component Value Date   TSH 1.39 12/28/2016     Routine general medical examination at a health care facility   We discussed the importance of regular physical activity and healthy diet for prevention of chronic illness and/or complications. Preventive guidelines reviewed. Continue following with Dr Nail for her routine gyn preventive care. Vaccination up to date except for influenza vaccine (refused). Aspirin for primary prevention discussed and to continue. Smoking cessation encouraged. Ca++ and vit D supplementation recommended. Next CPE in 1-2 years.   -     Comprehensive metabolic panel -     Hepatitis C antibody screen  Diabetes mellitus screening -     Comprehensive metabolic panel  Encounter for hepatitis C screening test for low risk patient -     Hepatitis C antibody screen  Hypothyroidism, postsurgical  No changes in current management, will follow labs done today and will give further recommendations accordingly. F/U in 12 months, sooner if needed.  -     TSH -     T4, free -     levothyroxine (SYNTHROID) 100 MCG tablet; Take 1 tablet (100 mcg total) by mouth daily before breakfast.  Hypoparathyroidism, unspecified hypoparathyroidism type ([place])  No changes in current management, will follow labs done today and will give further recommendations accordingly.  -     Parathyroid hormone, intact (no Ca)  Need for prophylactic vaccination and inoculation against influenza -     Flu Vaccine QUAD 36+ mos IM   -She will continue following with cardiologists, EKG's reviewed and has had occasional PVC's.   Return in 1 year (on 12/28/2017) for CPE.        Zahira Brummond G. Martinique, MD  [facility]. [place] office.

## 2016-12-28 NOTE — Patient Instructions (Addendum)
A few things to remember from today's visit:   Diabetes mellitus screening - Plan: Comprehensive metabolic panel  Encounter for hepatitis C screening test for low risk patient - Plan: Hepatitis C antibody screen  Routine general medical examination at a health care facility - Plan: Comprehensive metabolic panel, Hepatitis C antibody screen  Hypothyroidism, postsurgical - Plan: TSH, T4, free, levothyroxine (SYNTHROID) 100 MCG tablet     At least 150 minutes of moderate exercise per week, daily brisk walking for 15-30 min is a good exercise option. Healthy diet low in saturated (animal) fats and sweets and consisting of fresh fruits and vegetables, lean meats such as fish and white chicken and whole grains.   - Vaccines:  Tdap vaccine every 10 years.  Shingles vaccine recommended at age 45, could be given after 57 years of age but not sure about insurance coverage.  Pneumonia vaccines:  Prevnar 13 at 65 and Pneumovax at 35.  Screening recommendations for low/normal risk women:  Screening for diabetes at age 4-45 and every 3 years.  Cervical cancer prevention:  -HPV vaccination between 26-49 years old. -Pap smear starts at 57 years of age and continues periodically until 57 years old in low risk women. Pap smear every 3 years between 34 and 7 years old. Pap smear every 3 years between women 23 and older if pap smear negative and HPV screening negative.   -Breast cancer: Mammogram: There is disagreement between experts about when to start screening in low risk asymptomatic female but recent recommendations are to start screening at 53 and not later than 57 years old , every 1-2 years and after 57 yo q 2 years. Screening is recommended until 57 years old but some women can continue screening depending of healthy issues.   Colon cancer screening: starts at 57 years old until 57 years old.       Please be sure medication list is accurate. If a new problem present, please set  up appointment sooner than planned today.

## 2016-12-29 LAB — HEPATITIS C ANTIBODY: HCV Ab: NEGATIVE

## 2016-12-29 LAB — PARATHYROID HORMONE, INTACT (NO CA): PTH: 14 pg/mL (ref 14–64)

## 2016-12-30 ENCOUNTER — Encounter: Payer: Self-pay | Admitting: Family Medicine

## 2017-02-22 ENCOUNTER — Ambulatory Visit (INDEPENDENT_AMBULATORY_CARE_PROVIDER_SITE_OTHER): Payer: BLUE CROSS/BLUE SHIELD | Admitting: Family Medicine

## 2017-02-22 ENCOUNTER — Encounter: Payer: Self-pay | Admitting: Family Medicine

## 2017-02-22 VITALS — BP 140/80 | Temp 98.2°F | Ht 62.0 in | Wt 129.0 lb

## 2017-02-22 DIAGNOSIS — J320 Chronic maxillary sinusitis: Secondary | ICD-10-CM

## 2017-02-22 MED ORDER — AMOXICILLIN-POT CLAVULANATE 875-125 MG PO TABS: 1.0000 | ORAL_TABLET | Freq: Two times a day (BID) | ORAL | 0 refills | Status: DC

## 2017-02-22 NOTE — Patient Instructions (Signed)

## 2017-02-22 NOTE — Progress Notes (Signed)
Pre visit review using our clinic review tool, if applicable. No additional management support is needed unless otherwise documented below in the visit note. 

## 2017-02-22 NOTE — Progress Notes (Signed)
Subjective:     Patient ID: Christina Watts, female   DOB: [birth year], 56 y.o.   MRN: [id number]  HPI   Patient seen with concern for possible sinusitis. She's had only one day history of headache and has also had some bilateral ear pain and left maxillary facial pain. Mild sore throat. No fever. She's had some greenish nasal discharge. She's had frequent sinusitis in the past but not treated now for couple years. She has seen ENT in the past. Has not tried any saline irrigation   Past Medical History:  Diagnosis Date  . 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 This was associated with  Subsequent to that time she has had repeat laparoscopies for chronic, particularly right sided, pelvic pain on two occasions, one in 2000 and again in June of 2003.  At each time, pelvic adhesions were noted but no other significant pelvic pathology  . Adenomyosis    uterine adenomyosis, in 1995 total abdominal hysterectomy, right ovarian cystectomy  . GERD (gastroesophageal reflux disease)   . Hypercholesterolemia   . Hyperlipidemia   . Hypothyroidism   . Other and unspecified ovarian cysts    long history of recurring ovarian cysts  . Right bundle branch block   . Thyroid disease   . Tobacco abuse   . 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 Ulcer disease    currently asymptomatic and not on medication   Past Surgical History:  Procedure Laterality Date  . APPENDECTOMY     for obliteration of the appendiceal tip  . BREAST BIOPSY  02/1992   biopsy of benign breast mass  . CARDIAC CATHETERIZATION N/A 05/14/2015   Procedure: Left Heart Cath and Coronary Angiography;  Surgeon: [name], MD;  Location: [facility];  Service: Cardiovascular;  Laterality: N/A;  . ESOPHAGOGASTRODUODENOSCOPY  12/06/2005   Normal esophagogastroduodenoscopy --  Earle Gell, M.D.  . OVARIAN CYST REMOVAL  1995  . TOTAL ABDOMINAL HYSTERECTOMY W/ BILATERAL  SALPINGOOPHORECTOMY  1995  . TOTAL THYROIDECTOMY  05/13/2004   Multiple thyroid nodules, dominant mass, left thyroid lobe -- by, Earnstine Regal, M.D  . TUBAL LIGATION  1989    reports that she has been smoking Cigarettes.  She has a 9.50 pack-year smoking history. She has never used smokeless tobacco. She reports that she does not drink alcohol or use drugs. family history includes Asthma in her mother; Diabetes in her mother; Heart attack in her father; Heart disease in her father. Allergies  Allergen Reactions  . Promethazine Hcl Anaphylaxis, Hives and Other (See Comments)    Lungs swell and cant breathe    Review of Systems  Constitutional: Positive for fatigue. Negative for chills and fever.  HENT: Positive for congestion, sinus pain and sinus pressure.   Neurological: Positive for headaches.       Objective:   Physical Exam  Constitutional: She appears well-developed and well-nourished.  HENT:  Right Ear: External ear normal.  Left Ear: External ear normal.  Mouth/Throat: Oropharynx is clear and moist.  Neck: Neck supple.  Cardiovascular: Normal rate and regular rhythm.   Pulmonary/Chest: Effort normal and breath sounds normal. No respiratory distress. She has no wheezes. She has no rales.       Assessment:     Possible acute left maxillary sinusitis    Plan:     -Recommend saline nasal irrigation. -We recommended observation since she's only had one day of symptoms but she is very  concerned because of upcoming travel as above -We sent in script for Augmentin 875 mg twice a day for 10 days if not improving over the next several days  Eulas Post MD [facility]

## 2017-03-27 DIAGNOSIS — S6391XD Sprain of unspecified part of right wrist and hand, subsequent encounter: Secondary | ICD-10-CM | POA: Diagnosis not present

## 2017-03-27 DIAGNOSIS — M25572 Pain in left ankle and joints of left foot: Secondary | ICD-10-CM | POA: Diagnosis not present

## 2017-04-14 ENCOUNTER — Encounter (HOSPITAL_BASED_OUTPATIENT_CLINIC_OR_DEPARTMENT_OTHER): Payer: Self-pay | Admitting: *Deleted

## 2017-04-14 ENCOUNTER — Ambulatory Visit: Payer: Self-pay | Admitting: Physician Assistant

## 2017-04-14 NOTE — H&P (Signed)
Christina Watts is an 56 y.o. female.   Chief Complaint: left ankle pain HPI: patient presents with increasing pain over the last few months.  No new injury.  Previous ORIF ankle now with tender prominence over hardware.  xrays revealed loosening screw.  Past Medical History:  Diagnosis Date  . 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 This was associated with  Subsequent to that time she has had repeat laparoscopies for chronic, particularly right sided, pelvic pain on two occasions, one in 2000 and again in June of 2003.  At each time, pelvic adhesions were noted but no other significant pelvic pathology  . Adenomyosis    uterine adenomyosis, in 1995 total abdominal hysterectomy, right ovarian cystectomy  . Cancer ([place])    melanoma - right leg - good now  . GERD (gastroesophageal reflux disease)   . Headache    Migraines  . Hypercholesterolemia   . Hyperlipidemia   . Hypothyroidism   . Other and unspecified ovarian cysts    long history of recurring ovarian cysts  . Right bundle branch block   . Thyroid disease   . Tobacco abuse   . 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 Ulcer disease    currently asymptomatic and not on medication    Past Surgical History:  Procedure Laterality Date  . APPENDECTOMY     for obliteration of the appendiceal tip  . BREAST BIOPSY  02/1992   biopsy of benign breast mass  . CARDIAC CATHETERIZATION N/A 05/14/2015   Procedure: Left Heart Cath and Coronary Angiography;  Surgeon: [name], MD;  Location: [facility] [facility];  Service: Cardiovascular;  Laterality: N/A;  . ESOPHAGOGASTRODUODENOSCOPY  12/06/2005   Normal esophagogastroduodenoscopy --  Earle Gell, M.D.  . left ankle surgery    . OVARIAN CYST REMOVAL  1995  . TOTAL ABDOMINAL HYSTERECTOMY W/ BILATERAL SALPINGOOPHORECTOMY  1995  . TOTAL THYROIDECTOMY  05/13/2004   Multiple thyroid nodules, dominant mass, left thyroid lobe -- by,  Earnstine Regal, M.D  . TUBAL LIGATION  1989    Family History  Problem Relation Age of Onset  . Heart disease Father   . Heart attack Father   . Asthma Mother   . Diabetes Mother   . Heart disease      fathers side has a strong history of heart disease  . Hyperlipidemia      strong family history of   Social History:  reports that she has been smoking Cigarettes.  She has a 9.50 pack-year smoking history. She has never used smokeless tobacco. She reports that she drinks alcohol. She reports that she does not use drugs.  Allergies:  Allergies  Allergen Reactions  . Promethazine Hcl Anaphylaxis, Hives and Other (See Comments)    Lungs swell and cant breathe     (Not in a hospital admission)  No results found for this or any previous visit (from the past 48 hour(s)). No results found.  Review of Systems  Musculoskeletal: Positive for joint pain.  All other systems reviewed and are negative.   There were no vitals taken for this visit. Physical Exam  Constitutional: She is oriented to person, place, and time. She appears well-developed and well-nourished. No distress.  HENT:  Head: Normocephalic and atraumatic.  Nose: Nose normal.  Eyes: Conjunctivae and EOM are normal. Pupils are equal, round, and reactive to light.  Neck: Normal range of motion. Neck supple.  Cardiovascular: Normal rate, regular  rhythm and intact distal pulses.   Respiratory: Effort normal. No respiratory distress.  GI: Soft. She exhibits no distension. There is no tenderness.  Musculoskeletal:       Left ankle: Tenderness.  Neurological: She is alert and oriented to person, place, and time.  Skin: Skin is warm and dry. No rash noted. No erythema.  Psychiatric: She has a normal mood and affect. Her behavior is normal.     Assessment/Plan Left ankle painful hardware with loosening of screw  Discussed risks and benefits of left ankle hardware removal and she wishes to proceed.  Outpatient  procedure, general anesthesia, biomet screwdriver available and hardware removal set.    Chriss Czar, PA-C 04/14/2017, 10:01 PM

## 2017-04-14 NOTE — Progress Notes (Signed)
Dr. Gifford Shave reviewed Cardiac note from Dr. Burt Knack June 2017 and EKG - 2017 [facility] for surgery. Bring all medications

## 2017-04-17 ENCOUNTER — Encounter (HOSPITAL_BASED_OUTPATIENT_CLINIC_OR_DEPARTMENT_OTHER): Payer: Self-pay | Admitting: *Deleted

## 2017-04-18 ENCOUNTER — Encounter (HOSPITAL_BASED_OUTPATIENT_CLINIC_OR_DEPARTMENT_OTHER): Payer: Self-pay | Admitting: *Deleted

## 2017-04-19 ENCOUNTER — Encounter (HOSPITAL_BASED_OUTPATIENT_CLINIC_OR_DEPARTMENT_OTHER): Payer: Self-pay | Admitting: Anesthesiology

## 2017-04-19 ENCOUNTER — Encounter (HOSPITAL_BASED_OUTPATIENT_CLINIC_OR_DEPARTMENT_OTHER)
Admission: RE | Disposition: A | Discharge status: Home / Self Care | Payer: Self-pay | Source: Ambulatory Visit | Attending: Orthopedic Surgery

## 2017-04-19 ENCOUNTER — Ambulatory Visit (HOSPITAL_BASED_OUTPATIENT_CLINIC_OR_DEPARTMENT_OTHER): Payer: BLUE CROSS/BLUE SHIELD | Admitting: Anesthesiology

## 2017-04-19 ENCOUNTER — Ambulatory Visit (HOSPITAL_BASED_OUTPATIENT_CLINIC_OR_DEPARTMENT_OTHER)
Admission: RE | Admit: 2017-04-19 | Discharge: 2017-04-19 | Disposition: A | Discharge status: Home / Self Care | Payer: BLUE CROSS/BLUE SHIELD | Source: Ambulatory Visit | Attending: Orthopedic Surgery | Admitting: Orthopedic Surgery

## 2017-04-19 DIAGNOSIS — T8484XA Pain due to internal orthopedic prosthetic devices, implants and grafts, initial encounter: Secondary | ICD-10-CM | POA: Insufficient documentation

## 2017-04-19 DIAGNOSIS — F17210 Nicotine dependence, cigarettes, uncomplicated: Secondary | ICD-10-CM | POA: Diagnosis not present

## 2017-04-19 DIAGNOSIS — T84498A Other mechanical complication of other internal orthopedic devices, implants and grafts, initial encounter: Secondary | ICD-10-CM | POA: Diagnosis not present

## 2017-04-19 DIAGNOSIS — I499 Cardiac arrhythmia, unspecified: Secondary | ICD-10-CM | POA: Diagnosis not present

## 2017-04-19 DIAGNOSIS — K219 Gastro-esophageal reflux disease without esophagitis: Secondary | ICD-10-CM | POA: Diagnosis not present

## 2017-04-19 DIAGNOSIS — Y831 Surgical operation with implant of artificial internal device as the cause of abnormal reaction of the patient, or of later complication, without mention of misadventure at the time of the procedure: Secondary | ICD-10-CM | POA: Diagnosis not present

## 2017-04-19 DIAGNOSIS — Z85820 Personal history of malignant melanoma of skin: Secondary | ICD-10-CM | POA: Insufficient documentation

## 2017-04-19 DIAGNOSIS — E785 Hyperlipidemia, unspecified: Secondary | ICD-10-CM | POA: Diagnosis not present

## 2017-04-19 DIAGNOSIS — E890 Postprocedural hypothyroidism: Secondary | ICD-10-CM | POA: Diagnosis not present

## 2017-04-19 DIAGNOSIS — Z79899 Other long term (current) drug therapy: Secondary | ICD-10-CM | POA: Insufficient documentation

## 2017-04-19 DIAGNOSIS — Z7982 Long term (current) use of aspirin: Secondary | ICD-10-CM | POA: Insufficient documentation

## 2017-04-19 DIAGNOSIS — E7800 Pure hypercholesterolemia, unspecified: Secondary | ICD-10-CM | POA: Insufficient documentation

## 2017-04-19 HISTORY — DX: Malignant (primary) neoplasm, unspecified: C80.1

## 2017-04-19 HISTORY — DX: Headache, unspecified: R51.9

## 2017-04-19 HISTORY — PX: HARDWARE REMOVAL: SHX979

## 2017-04-19 HISTORY — DX: Headache: R51

## 2017-04-19 SURGERY — REMOVAL, HARDWARE
Anesthesia: General | Site: Ankle | Laterality: Left

## 2017-04-19 MED ORDER — DEXAMETHASONE SODIUM PHOSPHATE 4 MG/ML IJ SOLN
INTRAMUSCULAR | Status: DC | PRN
  Administered 2017-04-19: 10 mg via INTRAVENOUS

## 2017-04-19 MED ORDER — LACTATED RINGERS IV SOLN
INTRAVENOUS | Status: DC
  Administered 2017-04-19 (×2): via INTRAVENOUS

## 2017-04-19 MED ORDER — OXYCODONE HCL 5 MG PO TABS
ORAL_TABLET | ORAL | Status: AC
  Filled 2017-04-19: qty 1

## 2017-04-19 MED ORDER — PROPOFOL 10 MG/ML IV BOLUS
INTRAVENOUS | Status: DC | PRN
  Administered 2017-04-19: 200 mg via INTRAVENOUS

## 2017-04-19 MED ORDER — LIDOCAINE HCL (CARDIAC) 20 MG/ML IV SOLN
INTRAVENOUS | Status: DC | PRN
  Administered 2017-04-19: 50 mg via INTRAVENOUS

## 2017-04-19 MED ORDER — SODIUM CHLORIDE 0.9 % IV SOLN: INTRAVENOUS | Status: DC

## 2017-04-19 MED ORDER — OXYCODONE HCL 5 MG/5ML PO SOLN: 5.0000 mg | Freq: Once | ORAL | Status: AC | PRN

## 2017-04-19 MED ORDER — SCOPOLAMINE 1 MG/3DAYS TD PT72
1.0000 | MEDICATED_PATCH | Freq: Once | TRANSDERMAL | Status: DC | PRN
Start: 2017-04-19 — End: 2017-04-19
  Administered 2017-04-19: 1.5 mg via TRANSDERMAL

## 2017-04-19 MED ORDER — FENTANYL CITRATE (PF) 100 MCG/2ML IJ SOLN
INTRAMUSCULAR | Status: AC
  Filled 2017-04-19: qty 2

## 2017-04-19 MED ORDER — CEFAZOLIN SODIUM-DEXTROSE 2-4 GM/100ML-% IV SOLN
INTRAVENOUS | Status: AC
  Filled 2017-04-19: qty 100

## 2017-04-19 MED ORDER — OXYCODONE-ACETAMINOPHEN 5-325 MG PO TABS: 1.0000 | ORAL_TABLET | ORAL | 0 refills | Status: DC | PRN

## 2017-04-19 MED ORDER — ONDANSETRON HCL 4 MG/2ML IJ SOLN
INTRAMUSCULAR | Status: DC | PRN
  Administered 2017-04-19: 4 mg via INTRAVENOUS

## 2017-04-19 MED ORDER — FENTANYL CITRATE (PF) 100 MCG/2ML IJ SOLN: 25.0000 ug | INTRAMUSCULAR | Status: DC | PRN

## 2017-04-19 MED ORDER — LACTATED RINGERS IV SOLN: INTRAVENOUS | Status: DC

## 2017-04-19 MED ORDER — FENTANYL CITRATE (PF) 100 MCG/2ML IJ SOLN
50.0000 ug | INTRAMUSCULAR | Status: DC | PRN
  Administered 2017-04-19: 100 ug via INTRAVENOUS

## 2017-04-19 MED ORDER — CHLORHEXIDINE GLUCONATE 4 % EX LIQD
60.0000 mL | Freq: Once | CUTANEOUS | Status: DC
Start: 2017-04-19 — End: 2017-04-19

## 2017-04-19 MED ORDER — MEPERIDINE HCL 25 MG/ML IJ SOLN: 6.2500 mg | INTRAMUSCULAR | Status: DC | PRN

## 2017-04-19 MED ORDER — MIDAZOLAM HCL 2 MG/2ML IJ SOLN
1.0000 mg | INTRAMUSCULAR | Status: DC | PRN
  Administered 2017-04-19: 2 mg via INTRAVENOUS

## 2017-04-19 MED ORDER — SCOPOLAMINE 1 MG/3DAYS TD PT72
MEDICATED_PATCH | TRANSDERMAL | Status: AC
  Filled 2017-04-19: qty 1

## 2017-04-19 MED ORDER — OXYCODONE HCL 5 MG PO TABS
5.0000 mg | ORAL_TABLET | Freq: Once | ORAL | Status: AC | PRN
  Administered 2017-04-19: 5 mg via ORAL

## 2017-04-19 MED ORDER — MIDAZOLAM HCL 2 MG/2ML IJ SOLN
INTRAMUSCULAR | Status: AC
  Filled 2017-04-19: qty 2

## 2017-04-19 MED ORDER — CEFAZOLIN SODIUM-DEXTROSE 2-4 GM/100ML-% IV SOLN
2.0000 g | INTRAVENOUS | Status: AC
  Administered 2017-04-19: 2 g via INTRAVENOUS

## 2017-04-19 MED ORDER — BUPIVACAINE-EPINEPHRINE 0.5% -1:200000 IJ SOLN
INTRAMUSCULAR | Status: DC | PRN
  Administered 2017-04-19: 13 mL

## 2017-04-19 SURGICAL SUPPLY — 60 items
BANDAGE ACE 4X5 VEL STRL LF (GAUZE/BANDAGES/DRESSINGS) ×2 IMPLANT
BANDAGE ACE 6X5 VEL STRL LF (GAUZE/BANDAGES/DRESSINGS) ×2 IMPLANT
BANDAGE ESMARK 6X9 LF (GAUZE/BANDAGES/DRESSINGS) ×1 IMPLANT
BLADE CLIPPER SURG (BLADE) ×2 IMPLANT
BNDG CMPR 9X6 STRL LF SNTH (GAUZE/BANDAGES/DRESSINGS) ×1
BNDG COHESIVE 4X5 TAN STRL (GAUZE/BANDAGES/DRESSINGS) IMPLANT
BNDG ESMARK 6X9 LF (GAUZE/BANDAGES/DRESSINGS) ×2
BOOT STEPPER DURA MED (SOFTGOODS) ×2 IMPLANT
CANISTER SUCT 1200ML W/VALVE (MISCELLANEOUS) ×2 IMPLANT
CLEANER CAUTERY TIP 5X5 PAD (MISCELLANEOUS) IMPLANT
COVER BACK TABLE 60X90IN (DRAPES) ×2 IMPLANT
COVER MAYO STAND STRL (DRAPES) IMPLANT
CUFF TOURNIQUET SINGLE 34IN LL (TOURNIQUET CUFF) ×2 IMPLANT
DECANTER SPIKE VIAL GLASS SM (MISCELLANEOUS) IMPLANT
DRAPE EXTREMITY T 121X128X90 (DRAPE) ×2 IMPLANT
DRAPE INCISE IOBAN 66X45 STRL (DRAPES) IMPLANT
DRAPE OEC MINIVIEW 54X84 (DRAPES) IMPLANT
DRAPE U-SHAPE 47X51 STRL (DRAPES) IMPLANT
DRSG EMULSION OIL 3X3 NADH (GAUZE/BANDAGES/DRESSINGS) ×2 IMPLANT
DRSG PAD ABDOMINAL 8X10 ST (GAUZE/BANDAGES/DRESSINGS) ×2 IMPLANT
DURAPREP 26ML APPLICATOR (WOUND CARE) ×2 IMPLANT
ELECT REM PT RETURN 9FT ADLT (ELECTROSURGICAL)
ELECTRODE REM PT RTRN 9FT ADLT (ELECTROSURGICAL) IMPLANT
GAUZE SPONGE 4X4 12PLY STRL (GAUZE/BANDAGES/DRESSINGS) ×2 IMPLANT
GAUZE XEROFORM 1X8 LF (GAUZE/BANDAGES/DRESSINGS) IMPLANT
GLOVE BIOGEL PI IND STRL 7.0 (GLOVE) ×1 IMPLANT
GLOVE BIOGEL PI IND STRL 8 (GLOVE) ×2 IMPLANT
GLOVE BIOGEL PI INDICATOR 7.0 (GLOVE) ×1
GLOVE BIOGEL PI INDICATOR 8 (GLOVE) ×2
GLOVE ECLIPSE 6.5 STRL STRAW (GLOVE) ×2 IMPLANT
GLOVE SURG ORTHO 8.0 STRL STRW (GLOVE) ×2 IMPLANT
GOWN STRL REUS W/ TWL LRG LVL3 (GOWN DISPOSABLE) ×3 IMPLANT
GOWN STRL REUS W/ TWL XL LVL3 (GOWN DISPOSABLE) ×1 IMPLANT
GOWN STRL REUS W/TWL LRG LVL3 (GOWN DISPOSABLE) ×6
GOWN STRL REUS W/TWL XL LVL3 (GOWN DISPOSABLE) ×2
NEEDLE HYPO 22GX1.5 SAFETY (NEEDLE) IMPLANT
NS IRRIG 1000ML POUR BTL (IV SOLUTION) ×2 IMPLANT
PACK BASIN DAY SURGERY FS (CUSTOM PROCEDURE TRAY) ×2 IMPLANT
PAD CAST 4YDX4 CTTN HI CHSV (CAST SUPPLIES) IMPLANT
PAD CLEANER CAUTERY TIP 5X5 (MISCELLANEOUS)
PADDING CAST COTTON 4X4 STRL (CAST SUPPLIES)
PADDING CAST COTTON 6X4 STRL (CAST SUPPLIES) IMPLANT
PENCIL BUTTON HOLSTER BLD 10FT (ELECTRODE) IMPLANT
SPONGE LAP 4X18 X RAY DECT (DISPOSABLE) ×2 IMPLANT
STOCKINETTE 4X48 STRL (DRAPES) IMPLANT
STOCKINETTE IMPERVIOUS LG (DRAPES) IMPLANT
SUCTION FRAZIER HANDLE 10FR (MISCELLANEOUS)
SUCTION TUBE FRAZIER 10FR DISP (MISCELLANEOUS) IMPLANT
SUT ETHILON 3 0 FSL (SUTURE) ×2 IMPLANT
SUT ETHILON 4 0 PS 2 18 (SUTURE) IMPLANT
SUT VIC AB 0 CT1 27 (SUTURE)
SUT VIC AB 0 CT1 27XBRD ANBCTR (SUTURE) IMPLANT
SUT VIC AB 2-0 SH 27 (SUTURE) ×2
SUT VIC AB 2-0 SH 27XBRD (SUTURE) ×1 IMPLANT
SYR 20CC LL (SYRINGE) IMPLANT
SYR BULB 3OZ (MISCELLANEOUS) ×2 IMPLANT
TOWEL OR NON WOVEN STRL DISP B (DISPOSABLE) ×2 IMPLANT
TUBE CONNECTING 20X1/4 (TUBING) ×2 IMPLANT
UNDERPAD 30X30 (UNDERPADS AND DIAPERS) ×2 IMPLANT
YANKAUER SUCT BULB TIP NO VENT (SUCTIONS) ×2 IMPLANT

## 2017-04-19 NOTE — Anesthesia Postprocedure Evaluation (Addendum)
Anesthesia Post Note  Patient: Christina Watts  Procedure(s) Performed: Procedure(s) (LRB): HARDWARE REMOVAL LEFT ANKLE (Left)  Patient location during evaluation: PACU Anesthesia Type: General Level of consciousness: awake and alert Pain management: pain level controlled Vital Signs Assessment: post-procedure vital signs reviewed and stable Respiratory status: spontaneous breathing, nonlabored ventilation, respiratory function stable and patient connected to nasal cannula oxygen Cardiovascular status: blood pressure returned to baseline and stable Postop Assessment: no signs of nausea or vomiting Anesthetic complications: no       Last Vitals:  Vitals:   04/19/17 1545 04/19/17 1600  BP: (!) 156/99 (!) 164/90  Pulse: 78 70  Resp: (!) 9 13  Temp:      Last Pain:  Improving               Effie Berkshire

## 2017-04-19 NOTE — Transfer of Care (Signed)
Immediate Anesthesia Transfer of Care Note  Patient: Christina Watts  Procedure(s) Performed: Procedure(s): HARDWARE REMOVAL LEFT ANKLE (Left)  Patient Location: PACU  Anesthesia Type:General  Level of Consciousness: awake, alert  and oriented  Airway & Oxygen Therapy: Patient Spontanous Breathing and Patient connected to face mask oxygen  Post-op Assessment: Report given to RN and Post -op Vital signs reviewed and stable  Post vital signs: Reviewed and stable  Last Vitals:  Vitals:   04/19/17 1132 04/19/17 1534  BP: 139/88   Pulse: 78   Resp: 16   Temp: 36.9 C (P) 36.6 C    Last Pain:  Vitals:   04/19/17 1132  TempSrc: Oral  PainSc: 5          Complications: No apparent anesthesia complications

## 2017-04-19 NOTE — Anesthesia Procedure Notes (Signed)
Procedure Name: LMA Insertion Date/Time: 04/19/2017 2:51 PM Performed by: Melynda Ripple D Pre-anesthesia Checklist: Patient identified, Emergency Drugs available, Suction available and Patient being monitored Patient Re-evaluated:Patient Re-evaluated prior to inductionOxygen Delivery Method: Circle system utilized Preoxygenation: Pre-oxygenation with 100% oxygen Intubation Type: IV induction Ventilation: Mask ventilation without difficulty LMA: LMA inserted LMA Size: 3.0 Number of attempts: 1 Airway Equipment and Method: Bite block Placement Confirmation: positive ETCO2 Tube secured with: Tape Dental Injury: Teeth and Oropharynx as per pre-operative assessment

## 2017-04-19 NOTE — Anesthesia Preprocedure Evaluation (Addendum)
Anesthesia Evaluation  Patient identified by MRN, date of birth, ID band Patient awake    Reviewed: Allergy & Precautions, NPO status , Patient's Chart, lab work & pertinent test results  Airway Mallampati: I  TM Distance: >3 FB Neck ROM: Full    Dental  (+) Teeth Intact, Dental Advisory Given   Pulmonary Current Smoker,    breath sounds clear to auscultation       Cardiovascular + dysrhythmias  Rhythm:Regular Rate:Normal  RBBB   Neuro/Psych  Headaches, negative psych ROS   GI/Hepatic Neg liver ROS, GERD  ,  Endo/Other  Hypothyroidism   Renal/GU negative Renal ROS  negative genitourinary   Musculoskeletal negative musculoskeletal ROS (+)   Abdominal   Peds negative pediatric ROS (+)  Hematology negative hematology ROS (+)   Anesthesia Other Findings - HLD   Reproductive/Obstetrics negative OB ROS                            Lab Results  Component Value Date   WBC 11.0 (H) 07/04/2016   HGB 13.9 07/04/2016   HCT 42.0 07/04/2016   MCV 94.6 07/04/2016   PLT 319 07/04/2016   Lab Results  Component Value Date   CREATININE 0.67 12/28/2016   BUN 17 12/28/2016   NA 139 12/28/2016   K 4.2 12/28/2016   CL 101 12/28/2016   CO2 31 12/28/2016   Lab Results  Component Value Date   INR 0.88 05/14/2015   06/2016 EKG: normal sinus rhythm, RBBB, frequent PVC's noted.   Anesthesia Physical Anesthesia Plan  ASA: II  Anesthesia Plan: General   Post-op Pain Management:    Induction: Intravenous  Airway Management Planned: LMA  Additional Equipment:   Intra-op Plan:   Post-operative Plan: Extubation in OR  Informed Consent: I have reviewed the patients History and Physical, chart, labs and discussed the procedure including the risks, benefits and alternatives for the proposed anesthesia with the patient or authorized representative who has indicated his/her understanding and  acceptance.   Dental advisory given  Plan Discussed with: CRNA  Anesthesia Plan Comments:         Anesthesia Quick Evaluation

## 2017-04-19 NOTE — H&P (View-Only) (Signed)
Christina Watts is an 56 y.o. female.   Chief Complaint: left ankle pain HPI: patient presents with increasing pain over the last few months.  No new injury.  Previous ORIF ankle now with tender prominence over hardware.  xrays revealed loosening screw.  Past Medical History:  Diagnosis Date  . 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 This was associated with  Subsequent to that time she has had repeat laparoscopies for chronic, particularly right sided, pelvic pain on two occasions, one in 2000 and again in June of 2003.  At each time, pelvic adhesions were noted but no other significant pelvic pathology  . Adenomyosis    uterine adenomyosis, in 1995 total abdominal hysterectomy, right ovarian cystectomy  . Cancer ([place])    melanoma - right leg - good now  . GERD (gastroesophageal reflux disease)   . Headache    Migraines  . Hypercholesterolemia   . Hyperlipidemia   . Hypothyroidism   . Other and unspecified ovarian cysts    long history of recurring ovarian cysts  . Right bundle branch block   . Thyroid disease   . Tobacco abuse   . 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 Ulcer disease    currently asymptomatic and not on medication    Past Surgical History:  Procedure Laterality Date  . APPENDECTOMY     for obliteration of the appendiceal tip  . BREAST BIOPSY  02/1992   biopsy of benign breast mass  . CARDIAC CATHETERIZATION N/A 05/14/2015   Procedure: Left Heart Cath and Coronary Angiography;  Surgeon: [name], MD;  Location: [place] [facility];  Service: Cardiovascular;  Laterality: N/A;  . ESOPHAGOGASTRODUODENOSCOPY  12/06/2005   Normal esophagogastroduodenoscopy --  Earle Gell, M.D.  . left ankle surgery    . OVARIAN CYST REMOVAL  1995  . TOTAL ABDOMINAL HYSTERECTOMY W/ BILATERAL SALPINGOOPHORECTOMY  1995  . TOTAL THYROIDECTOMY  05/13/2004   Multiple thyroid nodules, dominant mass, left thyroid lobe -- by,  Earnstine Regal, M.D  . TUBAL LIGATION  1989    Family History  Problem Relation Age of Onset  . Heart disease Father   . Heart attack Father   . Asthma Mother   . Diabetes Mother   . Heart disease      fathers side has a strong history of heart disease  . Hyperlipidemia      strong family history of   Social History:  reports that she has been smoking Cigarettes.  She has a 9.50 pack-year smoking history. She has never used smokeless tobacco. She reports that she drinks alcohol. She reports that she does not use drugs.  Allergies:  Allergies  Allergen Reactions  . Promethazine Hcl Anaphylaxis, Hives and Other (See Comments)    Lungs swell and cant breathe     (Not in a hospital admission)  No results found for this or any previous visit (from the past 48 hour(s)). No results found.  Review of Systems  Musculoskeletal: Positive for joint pain.  All other systems reviewed and are negative.   There were no vitals taken for this visit. Physical Exam  Constitutional: She is oriented to person, place, and time. She appears well-developed and well-nourished. No distress.  HENT:  Head: Normocephalic and atraumatic.  Nose: Nose normal.  Eyes: Conjunctivae and EOM are normal. Pupils are equal, round, and reactive to light.  Neck: Normal range of motion. Neck supple.  Cardiovascular: Normal rate, regular  rhythm and intact distal pulses.   Respiratory: Effort normal. No respiratory distress.  GI: Soft. She exhibits no distension. There is no tenderness.  Musculoskeletal:       Left ankle: Tenderness.  Neurological: She is alert and oriented to person, place, and time.  Skin: Skin is warm and dry. No rash noted. No erythema.  Psychiatric: She has a normal mood and affect. Her behavior is normal.     Assessment/Plan Left ankle painful hardware with loosening of screw  Discussed risks and benefits of left ankle hardware removal and she wishes to proceed.  Outpatient  procedure, general anesthesia, biomet screwdriver available and hardware removal set.    Chriss Czar, PA-C 04/14/2017, 10:01 PM

## 2017-04-19 NOTE — Discharge Instructions (Signed)
Diet: As you were doing prior to hospitalization   Activity: Increase activity slowly as tolerated  No lifting or driving for 48 hours  Shower: May shower without a dressing on post op day #2, NO SOAKING in tub   Dressing: You may change your dressing on post op day #2.  Then change the dressing daily with sterile 4"x4"s gauze dressing   Weight Bearing: weight bearing as tolerated in boot.   To prevent constipation: you may use a stool softener such as -  Colace ( over the counter) 100 mg by mouth twice a day  Drink plenty of fluids ( prune juice may be helpful) and high fiber foods  Miralax ( over the counter) for constipation as needed.   Precautions: If you experience chest pain or shortness of breath - call 911 immediately For transfer to the hospital emergency department!!  If you develop a fever greater that 101 F, purulent drainage from wound, increased redness or drainage from wound, or calf pain -- Call the office   Follow- Up Appointment: Please call for an appointment to be seen in 2 weeks  [place] - [phone]     Post Anesthesia Home Care Instructions  Activity: Get plenty of rest for the remainder of the day. A responsible individual must stay with you for 24 hours following the procedure.  For the next 24 hours, DO NOT: -Drive a car -Paediatric nurse -Drink alcoholic beverages -Take any medication unless instructed by your physician -Make any legal decisions or sign important papers.  Meals: Start with liquid foods such as gelatin or soup. Progress to regular foods as tolerated. Avoid greasy, spicy, heavy foods. If nausea and/or vomiting occur, drink only clear liquids until the nausea and/or vomiting subsides. Call your physician if vomiting continues.  Special Instructions/Symptoms: Your throat may feel dry or sore from the anesthesia or the breathing tube placed in your throat during surgery. If this causes discomfort, gargle with warm salt water. The  discomfort should disappear within 24 hours.  If you had a scopolamine patch placed behind your ear for the management of post- operative nausea and/or vomiting:  1. The medication in the patch is effective for 72 hours, after which it should be removed.  Wrap patch in a tissue and discard in the trash. Wash hands thoroughly with soap and water. 2. You may remove the patch earlier than 72 hours if you experience unpleasant side effects which may include dry mouth, dizziness or visual disturbances. 3. Avoid touching the patch. Wash your hands with soap and water after contact with the patch.

## 2017-04-19 NOTE — Interval H&P Note (Signed)
History and Physical Interval Note:  04/19/2017 2:34 PM  Christina Watts  has presented today for surgery, with the diagnosis of PAINFUL RETAINED HARDWARE LEFT ANKLE  The various methods of treatment have been discussed with the patient and family. After consideration of risks, benefits and other options for treatment, the patient has consented to  Procedure(s): HARDWARE REMOVAL LEFT ANKLE (Left) as a surgical intervention .  The patient's history has been reviewed, patient examined, no change in status, stable for surgery.  I have reviewed the patient's chart and labs.  Questions were answered to the patient's satisfaction.     Aneesha Holloran JR,W D

## 2017-04-19 NOTE — Brief Op Note (Signed)
04/19/2017  3:31 PM  PATIENT:  Christina Watts  56 y.o. female  PRE-OPERATIVE DIAGNOSIS:  PAINFUL RETAINED HARDWARE LEFT ANKLE  POST-OPERATIVE DIAGNOSIS:  PAINFUL RETAINED HARDWARE LEFT ANKLE  PROCEDURE:  Procedure(s): HARDWARE REMOVAL LEFT ANKLE (Left)  SURGEON:  Surgeon(s) and Role:    * Earlie Server, MD - Primary  PHYSICIAN ASSISTANT: Chriss Czar, PA-C  ASSISTANTS:   ANESTHESIA:   local and general  EBL:  Total I/O In: 1500 [I.V.:1500] Out: 10 [Blood:10]  BLOOD ADMINISTERED:none  DRAINS: none   LOCAL MEDICATIONS USED:  MARCAINE     SPECIMEN:  No Specimen  DISPOSITION OF SPECIMEN:  N/A  COUNTS:  YES  TOURNIQUET:  * Missing tourniquet times found for documented tourniquets in log:  [id number] *  DICTATION: .Other Dictation: Dictation Number unknown  PLAN OF CARE: Discharge to home after PACU  PATIENT DISPOSITION:  PACU - hemodynamically stable.   Delay start of Pharmacological VTE agent (>24hrs) due to surgical blood loss or risk of bleeding: not applicable

## 2017-04-20 ENCOUNTER — Encounter (HOSPITAL_BASED_OUTPATIENT_CLINIC_OR_DEPARTMENT_OTHER): Payer: Self-pay | Admitting: Orthopedic Surgery

## 2017-04-21 NOTE — Op Note (Signed)
NAME:  SUSAN, BLEICH                     ACCOUNT NO.:  MEDICAL RECORD NO.:  [id number]  LOCATION:                                 FACILITY:  PHYSICIAN:  Lockie Pares, M.D.         DATE OF BIRTH:  DATE OF PROCEDURE:  04/19/2017 DATE OF DISCHARGE:                              OPERATIVE REPORT   PREOPERATIVE DIAGNOSIS:  Painful retained hardware, left ankle.  POSTOPERATIVE DIAGNOSIS:  Painful retained hardware, left ankle.  OPERATION:  Removal of hardware, left ankle.  SURGEON:  Lockie Pares, M.D.  ASSISTANTMarjo Bicker, PA.  ANESTHESIA:  General with local supplementation.  DESCRIPTION OF PROCEDURE:  General anesthetic, exsanguination of leg, inflation to 250.  Incision was made over the fibular plate, one of the distal most screws had backed out.  We removed all screws in the plate and the plate without difficulty.  Irrigated the wound, closed with interrupted Vicryl and nylon.  Lightly compressive sterile dressing applied and a boot.  Taken to recovery room in stable condition.     Lockie Pares, M.D.     WDC/MEDQ  D:  04/19/2017  T:  04/19/2017  Job:  [id number]

## 2017-05-04 DIAGNOSIS — T84498D Other mechanical complication of other internal orthopedic devices, implants and grafts, subsequent encounter: Secondary | ICD-10-CM | POA: Diagnosis not present

## 2017-05-08 DIAGNOSIS — T84498D Other mechanical complication of other internal orthopedic devices, implants and grafts, subsequent encounter: Secondary | ICD-10-CM | POA: Diagnosis not present

## 2017-05-10 ENCOUNTER — Ambulatory Visit (INDEPENDENT_AMBULATORY_CARE_PROVIDER_SITE_OTHER): Payer: BLUE CROSS/BLUE SHIELD | Admitting: Family Medicine

## 2017-05-10 ENCOUNTER — Encounter: Payer: Self-pay | Admitting: Family Medicine

## 2017-05-10 VITALS — BP 120/78 | HR 72 | Resp 12 | Ht 62.0 in | Wt 126.0 lb

## 2017-05-10 DIAGNOSIS — R030 Elevated blood-pressure reading, without diagnosis of hypertension: Secondary | ICD-10-CM | POA: Diagnosis not present

## 2017-05-10 DIAGNOSIS — I499 Cardiac arrhythmia, unspecified: Secondary | ICD-10-CM

## 2017-05-10 NOTE — Progress Notes (Signed)
HPI:   ACUTE VISIT:  Chief Complaint  Patient presents with  . high bp issues    [name] is a 56 y.o. female, who is here today complaining of intermittent elevated BP's since left ankle surgery, 04/19/17. BP's at home 120's-50's/70-80's Denies severe/frequent headache, visual changes, chest pain, dyspnea, claudication, focal weakness, or edema. Last week she was feeling some "fluttering", intermittent, at rest. Denies associated chest pain/discomfort, dyspnea, diaphoresis. It lasted a minute at the time, she is unsure about exacerbating or alleviating factors. She hasn't had any episode for the past 3 days.  After surgery she has had  some lightheadedness but improving. Surgery was done under general anesthesia, no complications reported.  She denies high caffeine intake, he also OTC cold medications, or frequent use of OTC NSAIDs. She is recovering well from surgery and taking Tramadol as needed for pain. She is ambulatory with a long surgical boot LLE.   + Smoker.   She denies history of HTN. She follows with Dr. Burt Knack annually for HLD and RBBB. Next appt scheduled for 06/2017.  Hx of hypothyroidism, she is on Synthroid 100 mcg daily.  Lab Results  Component Value Date   TSH 1.39 12/28/2016     Review of Systems  Constitutional: Negative for appetite change, fatigue and fever.  HENT: Negative for mouth sores, nosebleeds and trouble swallowing.   Eyes: Negative for pain and visual disturbance.  Respiratory: Negative for cough, shortness of breath and wheezing.   Cardiovascular: Positive for palpitations. Negative for chest pain and leg swelling.  Gastrointestinal: Negative for abdominal pain, nausea and vomiting.       Negative for changes in bowel habits.  Endocrine: Negative for cold intolerance and heat intolerance.  Genitourinary: Negative for decreased urine volume, dysuria and hematuria.  Skin: Negative for pallor and rash.  Neurological:  Negative for tremors, syncope, weakness, numbness and headaches.  Psychiatric/Behavioral: Negative.  Negative for confusion. The patient is not nervous/anxious.       Current Outpatient Prescriptions on File Prior to Visit  Medication Sig Dispense Refill  . Acetaminophen-Caffeine (EXCEDRIN TENSION HEADACHE PO) Take 1-2 tablets by mouth as needed. headache    . albuterol (PROVENTIL HFA;VENTOLIN HFA) 108 (90 Base) MCG/ACT inhaler Inhale 1-2 puffs into the lungs every 6 (six) hours as needed for wheezing or shortness of breath. 1 Inhaler 0  . aspirin 81 MG tablet Take 81 mg by mouth daily.    Marland Kitchen BIOTIN PO Take by mouth.    . calcium carbonate (OS-CAL) 600 MG TABS Take 600 mg by mouth daily with breakfast.     . estradiol (ESTRACE) 2 MG tablet Take 2 mg by mouth 2 (two) times daily.     Marland Kitchen levothyroxine (SYNTHROID) 100 MCG tablet Take 1 tablet (100 mcg total) by mouth daily before breakfast. 30 tablet 11  . nitroGLYCERIN (NITROSTAT) 0.4 MG SL tablet Place 1 tablet (0.4 mg total) under the tongue every 5 (five) minutes as needed for chest pain. 25 tablet 6  . ranitidine (ZANTAC) 75 MG tablet Take 75 mg by mouth 2 (two) times daily.    . simvastatin (ZOCOR) 80 MG tablet TAKE 1 TABLET BY MOUTH AT BEDTIME 30 tablet 11   No current facility-administered medications on file prior to visit.      Past Medical History:  Diagnosis Date  . 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 This was associated with  Subsequent to that time she has had repeat laparoscopies  for chronic, particularly right sided, pelvic pain on two occasions, one in 2000 and again in June of 2003.  At each time, pelvic adhesions were noted but no other significant pelvic pathology  . Adenomyosis    uterine adenomyosis, in 1995 total abdominal hysterectomy, right ovarian cystectomy  . Cancer ([place])    melanoma - right leg - good now  . GERD (gastroesophageal reflux disease)   . Headache     Migraines  . Hypercholesterolemia   . Hyperlipidemia   . Hypothyroidism   . Other and unspecified ovarian cysts    long history of recurring ovarian cysts  . Right bundle branch block   . Thyroid disease   . Tobacco abuse   . 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 Ulcer disease    currently asymptomatic and not on medication   Allergies  Allergen Reactions  . Promethazine Hcl Anaphylaxis, Hives and Other (See Comments)    Lungs swell and cant breathe    Social History   Social History  . Marital status: Married    Spouse name: N/A  . Number of children: N/A  . Years of education: N/A   Social History Main Topics  . Smoking status: Current Every Day Smoker    Packs/day: 0.50    Years: 19.00    Types: Cigarettes    Last attempt to quit: 05/09/2011  . Smokeless tobacco: Never Used  . Alcohol use Yes     Comment: Drinks wine occas  . Drug use: No  . Sexual activity: Yes    Birth control/ protection: Surgical   Other Topics Concern  . None   Social History Narrative   She is married with three children, and she has five sibling who are all still living.    Vitals:   05/10/17 1046  BP: 120/78  Pulse: 72  Resp: 12   Body mass index is 23.05 kg/m.   Physical Exam  Nursing note and vitals reviewed. Constitutional: She is oriented to person, place, and time. She appears well-developed and well-nourished. No distress.  HENT:  Head: Atraumatic.  Mouth/Throat: Oropharynx is clear and moist and mucous membranes are normal.  Eyes: Conjunctivae and EOM are normal.  Neck: No tracheal deviation present. No thyroid mass and no thyromegaly present.  Cardiovascular: Normal rate.  An irregular rhythm present.  No murmur heard. Pulses:      Dorsalis pedis pulses are 2+ on the right side, and 2+ on the left side.  No calves tenderness, edema,or erythema (left long cast removed for examination).  Respiratory: Effort normal and breath sounds normal. No respiratory  distress.  GI: Soft. She exhibits no mass. There is no hepatomegaly. There is no tenderness.  Musculoskeletal: She exhibits no edema.  Lymphadenopathy:    She has no cervical adenopathy.  Neurological: She is alert and oriented to person, place, and time. She has normal strength. Coordination normal.  Skin: Skin is warm. No erythema.  Psychiatric: She has a normal mood and affect.  Well groomed, good eye contact.      ASSESSMENT AND PLAN:   Brytney was seen today for high bp issues.  Diagnoses and all orders for this visit:  Irregular heart rate  She has had PVC's on prior EKG's. Today EKG: SR, normal axis, PVC's, no signs of acute ischemia. EKG 06/2016 no significant changes. Clearly instructed about warning signs. Smoking cessation encouraged. Keep appointment with Dr. Burt Knack.  -     EKG 12-Lead  Elevated blood pressure reading  Today BP is normal. Re checked 120/80. Recommend decreasing salt intake. Avoid OTC NSAIDs. Continue monitoring BP at home. Instructed about warning signs.     -[name] was advised to return or notify a doctor immediately if symptoms worsen or persist or new concerns arise.       Vitaliy Eisenhour G. Martin[place], MD  [facility]. [place] office.

## 2017-05-15 DIAGNOSIS — T84498D Other mechanical complication of other internal orthopedic devices, implants and grafts, subsequent encounter: Secondary | ICD-10-CM | POA: Diagnosis not present

## 2017-05-23 DIAGNOSIS — T84498D Other mechanical complication of other internal orthopedic devices, implants and grafts, subsequent encounter: Secondary | ICD-10-CM | POA: Diagnosis not present

## 2017-05-26 NOTE — Addendum Note (Signed)
Addendum  created 05/26/17 1229 by Effie Berkshire, MD   Sign clinical note

## 2017-06-04 ENCOUNTER — Emergency Department (HOSPITAL_BASED_OUTPATIENT_CLINIC_OR_DEPARTMENT_OTHER)
Admission: EM | Admit: 2017-06-04 | Discharge: 2017-06-04 | Disposition: A | Discharge status: Home / Self Care | Payer: BLUE CROSS/BLUE SHIELD | Attending: Emergency Medicine | Admitting: Emergency Medicine

## 2017-06-04 ENCOUNTER — Encounter (HOSPITAL_BASED_OUTPATIENT_CLINIC_OR_DEPARTMENT_OTHER): Payer: Self-pay

## 2017-06-04 ENCOUNTER — Emergency Department (HOSPITAL_BASED_OUTPATIENT_CLINIC_OR_DEPARTMENT_OTHER): Payer: BLUE CROSS/BLUE SHIELD

## 2017-06-04 DIAGNOSIS — E039 Hypothyroidism, unspecified: Secondary | ICD-10-CM | POA: Insufficient documentation

## 2017-06-04 DIAGNOSIS — R059 Cough, unspecified: Secondary | ICD-10-CM

## 2017-06-04 DIAGNOSIS — R05 Cough: Secondary | ICD-10-CM | POA: Diagnosis not present

## 2017-06-04 DIAGNOSIS — Z79899 Other long term (current) drug therapy: Secondary | ICD-10-CM | POA: Diagnosis not present

## 2017-06-04 DIAGNOSIS — Z7982 Long term (current) use of aspirin: Secondary | ICD-10-CM | POA: Insufficient documentation

## 2017-06-04 DIAGNOSIS — F17210 Nicotine dependence, cigarettes, uncomplicated: Secondary | ICD-10-CM | POA: Insufficient documentation

## 2017-06-04 MED ORDER — BENZONATATE 100 MG PO CAPS
100.0000 mg | ORAL_CAPSULE | Freq: Three times a day (TID) | ORAL | 0 refills | Status: DC
Start: 2017-06-04 — End: 2017-06-12

## 2017-06-04 MED ORDER — ALBUTEROL SULFATE HFA 108 (90 BASE) MCG/ACT IN AERS
2.0000 | INHALATION_SPRAY | Freq: Once | RESPIRATORY_TRACT | Status: AC
  Administered 2017-06-04: 2 via RESPIRATORY_TRACT

## 2017-06-04 MED ORDER — AZITHROMYCIN 250 MG PO TABS: 250.0000 mg | ORAL_TABLET | Freq: Every day | ORAL | 0 refills | Status: DC

## 2017-06-04 NOTE — Discharge Instructions (Signed)
Take the prescribed medication as directed.  Can use inhaler when needed for wheezing/, etc. Follow-up with your primary care doctor if you continue having ongoing issues despite treatment. Return to the ED for new or worsening symptoms.

## 2017-06-04 NOTE — ED Triage Notes (Signed)
Pt reports head cold since Wednesday. Cough started last night. Productive with green mucus.

## 2017-06-04 NOTE — ED Notes (Signed)
Pt ambulatory to XR with tech.

## 2017-06-04 NOTE — ED Provider Notes (Signed)
[facility] DEPT [facility] Provider Note   CSN: [id number] Arrival date & time: 06/04/17  0920     History   Chief Complaint Chief Complaint  Patient presents with  . Cough  . URI    HPI Christina Watts is a 56 y.o. female.  The history is provided by the patient and medical records.  Cough   URI   Associated symptoms include cough.     56 year old female with history of uterine adenomyosis, GERD, migraine headaches, hyperlipidemia, hypothyroidism, hypercholesterolemia, presenting to the ED with cough. Patient states she started getting a "head cold" about 3 days ago, but feels now has migrated to her chest. She reports yesterday she began having productive cough with green sputum. She denies any chest pain or shortness of breath. No fever or chills. No sick contacts. Patient is a smoker, smokes about 1 pack every 2-3 days. States she does have history of recurrent bronchitis, but no issues in the past few months.  Past Medical History:  Diagnosis Date  . 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 This was associated with  Subsequent to that time she has had repeat laparoscopies for chronic, particularly right sided, pelvic pain on two occasions, one in 2000 and again in June of 2003.  At each time, pelvic adhesions were noted but no other significant pelvic pathology  . Adenomyosis    uterine adenomyosis, in 1995 total abdominal hysterectomy, right ovarian cystectomy  . Cancer ([place])    melanoma - right leg - good now  . GERD (gastroesophageal reflux disease)   . Headache    Migraines  . Hypercholesterolemia   . Hyperlipidemia   . Hypothyroidism   . Other and unspecified ovarian cysts    long history of recurring ovarian cysts  . Right bundle branch block   . Thyroid disease   . Tobacco abuse   . 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 Ulcer disease    currently asymptomatic and not on medication    Patient Active  Problem List   Diagnosis Date Noted  . Hypothyroidism, postsurgical 07/25/2016  . Hypoparathyroidism ([place]) 07/25/2016  . Tobacco abuse disorder 07/25/2016  . Hypocalcemia 07/25/2016  . Chest tightness or pressure 06/06/2011  . Hyperlipemia 06/06/2011    Past Surgical History:  Procedure Laterality Date  . APPENDECTOMY     for obliteration of the appendiceal tip  . BREAST BIOPSY  02/1992   biopsy of benign breast mass  . CARDIAC CATHETERIZATION N/A 05/14/2015   Procedure: Left Heart Cath and Coronary Angiography;  Surgeon: [name], MD;  Location: [facility];  Service: Cardiovascular;  Laterality: N/A;  . ESOPHAGOGASTRODUODENOSCOPY  12/06/2005   Normal esophagogastroduodenoscopy --  Earle Gell, M.D.  . HARDWARE REMOVAL Left 04/19/2017   Procedure: HARDWARE REMOVAL LEFT ANKLE;  Surgeon: Earlie Server, MD;  Location: [facility];  Service: Orthopedics;  Laterality: Left;  . left ankle surgery    . OVARIAN CYST REMOVAL  1995  . TOTAL ABDOMINAL HYSTERECTOMY W/ BILATERAL SALPINGOOPHORECTOMY  1995  . TOTAL THYROIDECTOMY  05/13/2004   Multiple thyroid nodules, dominant mass, left thyroid lobe -- by, Earnstine Regal, M.D  . TUBAL LIGATION  1989    OB History    No data available       Home Medications    Prior to Admission medications   Medication Sig Start Date End Date Taking? Authorizing Provider  Acetaminophen-Caffeine (EXCEDRIN TENSION HEADACHE PO) Take 1-2  tablets by mouth as needed. headache    [provider]  albuterol (PROVENTIL HFA;VENTOLIN HFA) 108 (90 Base) MCG/ACT inhaler Inhale 1-2 puffs into the lungs every 6 (six) hours as needed for wheezing or shortness of breath. 07/04/16   [place], PA-C  aspirin 81 MG tablet Take 81 mg by mouth daily.    [provider]  BIOTIN PO Take by mouth.    [provider]  calcium carbonate (OS-CAL) 600 MG TABS Take 600 mg by mouth daily with breakfast.     [provider]  estradiol (ESTRACE) 2 MG tablet Take 2 mg by mouth 2 (two) times daily.  05/12/11   [provider]  levothyroxine (SYNTHROID) 100 MCG tablet Take 1 tablet (100 mcg total) by mouth daily before breakfast. 12/28/16   Martin[place], Betty G, MD  nitroGLYCERIN (NITROSTAT) 0.4 MG SL tablet Place 1 tablet (0.4 mg total) under the tongue every 5 (five) minutes as needed for chest pain. 06/22/16   [name], MD  ranitidine (ZANTAC) 75 MG tablet Take 75 mg by mouth 2 (two) times daily.    [provider]  simvastatin (ZOCOR) 80 MG tablet TAKE 1 TABLET BY MOUTH AT BEDTIME 06/22/16   [name], MD    Family History Family History  Problem Relation Age of Onset  . Heart disease Father   . Heart attack Father   . Asthma Mother   . Diabetes Mother   . Heart disease Unknown        fathers side has a strong history of heart disease  . Hyperlipidemia Unknown        strong family history of    Social History Social History  Substance Use Topics  . Smoking status: Current Every Day Smoker    Packs/day: 0.50    Years: 19.00    Types: Cigarettes    Last attempt to quit: 05/09/2011  . Smokeless tobacco: Never Used  . Alcohol use Yes     Comment: Drinks wine occas     Allergies   Promethazine hcl   Review of Systems Review of Systems  Respiratory: Positive for cough.   All other systems reviewed and are negative.    Physical Exam Updated Vital Signs BP (!) 159/95   Pulse 72   Temp 98.4 F (36.9 C) (Oral)   Resp 18   Ht 5\' 2"  (1.575 m)   Wt 56.7 kg (125 lb)   SpO2 97%   BMI 22.86 kg/m   Physical Exam  Constitutional: She is oriented to person, place, and time. She appears well-developed and well-nourished.  HENT:  Head: Normocephalic and atraumatic.  Right Ear: Tympanic membrane and ear canal normal.  Left Ear: Tympanic membrane and ear canal normal.  Nose: Nose normal.  Mouth/Throat: Uvula is midline, oropharynx is clear and moist and  mucous membranes are normal. No oropharyngeal exudate, posterior oropharyngeal edema, posterior oropharyngeal erythema or tonsillar abscesses.  Voice sounds raspy but no stridor or trouble handling secretions  Eyes: Conjunctivae and EOM are normal. Pupils are equal, round, and reactive to light.  Neck: Normal range of motion.  Cardiovascular: Normal rate, regular rhythm and normal heart sounds.   Pulmonary/Chest: Effort normal and breath sounds normal. She has no wheezes. She has no rhonchi.  Course breath sounds in left lower lung field, no overt wheezes or rhonchi, no distress  Abdominal: Soft. Bowel sounds are normal.  Musculoskeletal: Normal range of motion.  Neurological: She is alert and oriented  to person, place, and time.  Skin: Skin is warm and dry.  Psychiatric: She has a normal mood and affect.  Nursing note and vitals reviewed.    ED Treatments / Results  Labs (all labs ordered are listed, but only abnormal results are displayed) Labs Reviewed - No data to display  EKG  EKG Interpretation None       Radiology Dg Chest 2 View  Result Date: 06/04/2017 CLINICAL DATA:  Cough and congestion for 1 day EXAM: CHEST  2 VIEW COMPARISON:  07/04/2016 FINDINGS: Normal heart size. Stable epicardial fat at the right pericardial phrenic angle, documented on a CT abdomen dated 03/05/2013. Lungs hyperaerated and clear. No pneumothorax. No pleural effusion. IMPRESSION: No active cardiopulmonary disease. Electronically Signed   By: Marybelle Killings M.D.   On: 06/04/2017 09:53    Procedures Procedures (including critical care time)  Medications Ordered in ED Medications - No data to display   Initial Impression / Assessment and Plan / ED Course  I have reviewed the triage vital signs and the nursing notes.  Pertinent labs & imaging results that were available during my care of the patient were reviewed by me and considered in my medical decision making (see chart for  details).  56 year old female here with productive cough with green sputum since yesterday, nasal congestion and other URI symptoms for about 3 days. Here she is afebrile and nontoxic. She does have some coarse breath sounds in the left lower lung field, however no distinct wheezes or rhonchi. Denies any chest pain or shortness of breath. Chest x-ray was obtained and is negative. Patient does have history of her chronic bronchitis and her smoking history does put her at increased risk. Will start on azithromycin, Tessalon. She is given albuterol inhaler as she reports this is helped her in the past as well. Recommended that she follow-up closely with her primary care doctor if she has any ongoing issues.  Discussed plan with patient, she acknowledged understanding and agreed with plan of care.  Return precautions given for new or worsening symptoms.  Final Clinical Impressions(s) / ED Diagnoses   Final diagnoses:  Cough    New Prescriptions New Prescriptions   AZITHROMYCIN (ZITHROMAX) 250 MG TABLET    Take 1 tablet (250 mg total) by mouth daily. Take first 2 tablets together, then 1 every day until finished.   BENZONATATE (TESSALON) 100 MG CAPSULE    Take 1 capsule (100 mg total) by mouth every 8 (eight) hours.     Larene Pickett, PA-C 06/04/17 [place], [place], DO 06/04/17 1252

## 2017-06-06 ENCOUNTER — Ambulatory Visit (INDEPENDENT_AMBULATORY_CARE_PROVIDER_SITE_OTHER): Payer: BLUE CROSS/BLUE SHIELD | Admitting: Family Medicine

## 2017-06-06 ENCOUNTER — Encounter: Payer: Self-pay | Admitting: Family Medicine

## 2017-06-06 VITALS — BP 134/80 | HR 79 | Resp 12 | Ht 62.0 in | Wt 124.5 lb

## 2017-06-06 DIAGNOSIS — R05 Cough: Secondary | ICD-10-CM | POA: Diagnosis not present

## 2017-06-06 DIAGNOSIS — J069 Acute upper respiratory infection, unspecified: Secondary | ICD-10-CM | POA: Diagnosis not present

## 2017-06-06 DIAGNOSIS — R0989 Other specified symptoms and signs involving the circulatory and respiratory systems: Secondary | ICD-10-CM | POA: Diagnosis not present

## 2017-06-06 DIAGNOSIS — J989 Respiratory disorder, unspecified: Secondary | ICD-10-CM

## 2017-06-06 DIAGNOSIS — R059 Cough, unspecified: Secondary | ICD-10-CM

## 2017-06-06 MED ORDER — HYDROCODONE-HOMATROPINE 5-1.5 MG/5ML PO SYRP: 5.0000 mL | ORAL_SOLUTION | Freq: Two times a day (BID) | ORAL | 0 refills | Status: DC | PRN

## 2017-06-06 MED ORDER — ALBUTEROL SULFATE HFA 108 (90 BASE) MCG/ACT IN AERS: 1.0000 | INHALATION_SPRAY | Freq: Four times a day (QID) | RESPIRATORY_TRACT | 1 refills | Status: DC | PRN

## 2017-06-06 MED ORDER — METHYLPREDNISOLONE ACETATE 80 MG/ML IJ SUSP
40.0000 mg | Freq: Once | INTRAMUSCULAR | Status: AC
  Administered 2017-06-06: 40 mg via INTRAMUSCULAR

## 2017-06-06 NOTE — Patient Instructions (Signed)
A few things to remember from today's visit:   Reactive airway disease that is not asthma - Plan: albuterol (PROVENTIL HFA;VENTOLIN HFA) 108 (90 Base) MCG/ACT inhaler  URI, acute  Cough - Plan: HYDROcodone-homatropine (HYCODAN) 5-1.5 MG/5ML syrup  Albuterol inh 2 puff every 6 hours for a week then as needed for wheezing or shortness of breath.  Plain Mucinex may help. Smoking cessation strongly recommended.   Over the counter medications as decongestants and cold medications usually help, they need to be taken with caution if there is a history of high blood pressure or palpitations. Tylenol and/or Ibuprofen also helps with most symptoms (headache, muscle aching, fever,etc) Plenty of fluids. Honey helps with cough. Steam inhalations helps with runny nose, nasal congestion. Cough and nasal congestion could last a few days and sometimes weeks. Please follow in not any better in 2 weeks or if symptoms get worse.   Please be sure medication list is accurate. If a new problem present, please set up appointment sooner than planned today.

## 2017-06-06 NOTE — Progress Notes (Signed)
HPI:   ACUTE VISIT:  Chief Complaint  Patient presents with  . Cough    [name] is a 56 y.o. female, who is here today complaining of persistent cough and chest congestion. She was seen recently in the ER because respiratory symptoms, 06/04/17 that started about 2 days before visit. She was discharged on Azithromycin and Benzonatate, the latter one has not helped at all with cough. Yesterday she noted "little" wheezing and chest tightness. Cough is mostly non productive and exacerbated by deep breathing. No alleviating factors identified.   + Smoker, has not smoked in 3 days. + Dysphonia.   Cough  This is a new problem. The current episode started in the past 7 days. The problem has been unchanged. Associated symptoms include postnasal drip, rhinorrhea and wheezing. Pertinent negatives include no ear pain, eye redness, fever, headaches, myalgias, rash, sore throat or shortness of breath. The symptoms are aggravated by exercise. Risk factors for lung disease include smoking/tobacco exposure. She has tried prescription cough suppressant for the symptoms. The treatment provided no relief. Her past medical history is significant for environmental allergies.   She denies heartburn, Hx of GERD, she is on Zantac. No clear Hx of COPD but has recurrent episodes of "bronchitis" every year.  CXR on 06/04/17: Negative for cardiopulmonary disease.  She denies recent travel or sick contact.  Review of Systems  Constitutional: Positive for activity change and fatigue. Negative for appetite change and fever.  HENT: Positive for congestion, postnasal drip, rhinorrhea and voice change. Negative for ear pain, mouth sores, sinus pain, sneezing, sore throat and trouble swallowing.   Eyes: Negative for discharge and redness.  Respiratory: Positive for cough, chest tightness and wheezing. Negative for shortness of breath.   Gastrointestinal: Negative for abdominal pain, diarrhea,  nausea and vomiting.  Musculoskeletal: Negative for back pain, joint swelling, myalgias and neck pain.  Skin: Negative for rash.  Allergic/Immunologic: Positive for environmental allergies.  Neurological: Negative for syncope, weakness and headaches.  Hematological: Negative for adenopathy. Does not bruise/bleed easily.  Psychiatric/Behavioral: Positive for sleep disturbance. Negative for confusion. The patient is nervous/anxious.       Current Outpatient Prescriptions on File Prior to Visit  Medication Sig Dispense Refill  . Acetaminophen-Caffeine (EXCEDRIN TENSION HEADACHE PO) Take 1-2 tablets by mouth as needed. headache    . aspirin 81 MG tablet Take 81 mg by mouth daily.    . benzonatate (TESSALON) 100 MG capsule Take 1 capsule (100 mg total) by mouth every 8 (eight) hours. (Patient not taking: Reported on 06/09/2017) 21 capsule 0  . BIOTIN PO Take by mouth.    . calcium carbonate (OS-CAL) 600 MG TABS Take 600 mg by mouth daily with breakfast.     . estradiol (ESTRACE) 2 MG tablet Take 2 mg by mouth 2 (two) times daily.     Marland Kitchen levothyroxine (SYNTHROID) 100 MCG tablet Take 1 tablet (100 mcg total) by mouth daily before breakfast. 30 tablet 11  . nitroGLYCERIN (NITROSTAT) 0.4 MG SL tablet Place 1 tablet (0.4 mg total) under the tongue every 5 (five) minutes as needed for chest pain. 25 tablet 6  . ranitidine (ZANTAC) 75 MG tablet Take 75 mg by mouth 2 (two) times daily.    . simvastatin (ZOCOR) 80 MG tablet TAKE 1 TABLET BY MOUTH AT BEDTIME 30 tablet 11   No current facility-administered medications on file prior to visit.      Past Medical History:  Diagnosis Date  .  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 This was associated with  Subsequent to that time she has had repeat laparoscopies for chronic, particularly right sided, pelvic pain on two occasions, one in 2000 and again in June of 2003.  At each time, pelvic adhesions were noted but no  other significant pelvic pathology  . Adenomyosis    uterine adenomyosis, in 1995 total abdominal hysterectomy, right ovarian cystectomy  . Cancer ([place])    melanoma - right leg - good now  . GERD (gastroesophageal reflux disease)   . Headache    Migraines  . Hypercholesterolemia   . Hyperlipidemia   . Hypothyroidism   . Other and unspecified ovarian cysts    long history of recurring ovarian cysts  . Right bundle branch block   . Thyroid disease   . Tobacco abuse   . 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 Ulcer disease    currently asymptomatic and not on medication   Allergies  Allergen Reactions  . Promethazine Hcl Anaphylaxis, Hives and Other (See Comments)    Lungs swell and cant breathe    Social History   Social History  . Marital status: Married    Spouse name: N/A  . Number of children: N/A  . Years of education: N/A   Social History Main Topics  . Smoking status: Current Every Day Smoker    Packs/day: 0.50    Years: 19.00    Types: Cigarettes    Last attempt to quit: 05/09/2011  . Smokeless tobacco: Never Used  . Alcohol use Yes     Comment: Drinks wine occas  . Drug use: No  . Sexual activity: Yes    Birth control/ protection: Surgical   Other Topics Concern  . None   Social History Narrative   She is married with three children, and she has five sibling who are all still living.    Vitals:   06/06/17 1424  BP: 134/80  Pulse: 79  Resp: 12  O2 sat at RA 97% Body mass index is 22.77 kg/m.   Physical Exam  Nursing note and vitals reviewed. Constitutional: She is oriented to person, place, and time. She appears well-developed and well-nourished. She does not appear ill. No distress.  HENT:  Head: Atraumatic.  Nose: Rhinorrhea present. Right sinus exhibits no maxillary sinus tenderness and no frontal sinus tenderness. Left sinus exhibits no maxillary sinus tenderness and no frontal sinus tenderness.  Mouth/Throat: Oropharynx is clear  and moist and mucous membranes are normal.  Hypertrophic turbinates. Post nasal drainage. Mild dysphonia.  Eyes: Conjunctivae are normal.  Neck: No muscular tenderness present. No edema and no erythema present.  Cardiovascular: Normal rate and regular rhythm.   No murmur heard. Respiratory: Effort normal and breath sounds normal. No stridor. No respiratory distress. She has no wheezes. She has no rales.  Prolonged expiration.  Lymphadenopathy:       Head (right side): No submandibular adenopathy present.       Head (left side): No submandibular adenopathy present.    She has no cervical adenopathy.  Neurological: She is alert and oriented to person, place, and time. She has normal strength.  Skin: Skin is warm. No rash noted. No erythema.  Psychiatric: She has a normal mood and affect. Her speech is normal.  Well groomed, good eye contact.    ASSESSMENT AND PLAN:   Grizel was seen today for cough.  Diagnoses and all orders for this visit:  Reactive airway disease that is not asthma  ? COPD. Strongly recommend smoking cessation. Requesting "steroid shot", after verbal consent Depo Medrol 40 mg IM given.  Albuterol inh 2 puff every 6 hours for a week then as needed for wheezing or shortness of breath.  Instructed about warning signs. F/U as needed.  -     albuterol (PROVENTIL HFA;VENTOLIN HFA) 108 (90 Base) MCG/ACT inhaler; Inhale 1-2 puffs into the lungs every 6 (six) hours as needed for wheezing or shortness of breath. -     methylPREDNISolone acetate (DEPO-MEDROL) injection 40 mg; Inject 0.5 mLs (40 mg total) into the muscle once.  URI, acute  Most likely viral. OTC Plain Mucinex may help. Plenty of po fluids. Voice rest for dysphonia. F/U as needed.   Cough  We dicussed physiopathology and importance of cough. Explained that cough may last days and even weeks, mainly because her Hx of smoking. Other aggravating factors as GERD may also play a role on persistent  cough. Some side effects of Hycodan discussed, she can take it mainly at night to help her sleep.  -     HYDROcodone-homatropine (HYCODAN) 5-1.5 MG/5ML syrup; Take 5 mLs by mouth every 12 (twelve) hours as needed for cough.    -[name] was advised to seek immediate medical attention is symptoms suddenly get worse or to follow if symptoms persist or new concerns arise.     Braidyn Peace G. Martinique, MD  [facility]. [place] office.

## 2017-06-09 ENCOUNTER — Ambulatory Visit (INDEPENDENT_AMBULATORY_CARE_PROVIDER_SITE_OTHER): Payer: BLUE CROSS/BLUE SHIELD | Admitting: Family Medicine

## 2017-06-09 ENCOUNTER — Telehealth: Payer: Self-pay | Admitting: Family Medicine

## 2017-06-09 ENCOUNTER — Encounter: Payer: Self-pay | Admitting: Family Medicine

## 2017-06-09 VITALS — BP 138/80 | HR 65 | Temp 98.8°F | Ht 62.0 in | Wt 123.2 lb

## 2017-06-09 DIAGNOSIS — J209 Acute bronchitis, unspecified: Secondary | ICD-10-CM

## 2017-06-09 MED ORDER — AMOXICILLIN-POT CLAVULANATE 875-125 MG PO TABS: 1.0000 | ORAL_TABLET | Freq: Two times a day (BID) | ORAL | 0 refills | Status: DC

## 2017-06-09 MED ORDER — PREDNISONE 20 MG PO TABS: ORAL_TABLET | ORAL | 0 refills | Status: DC

## 2017-06-09 NOTE — Patient Instructions (Signed)
Take prednisone and augmentin to see if we can help this. I suspect this is acting like a copd flare up giving long term smoking.   I want you to see Dr. Martinique back on Monday at 2 30 PM- please schedule before you leave  If you have shortness of breath worsening when not in coughing fits- would go to the hospital or urgent care

## 2017-06-09 NOTE — Progress Notes (Signed)
Subjective:  Christina Watts is a 56 y.o. year old very pleasant female patient who presents for/with See problem oriented charting ROS- no fever, chills, nausea, vomiting. Has shortness of breath, frequent cough.    Past Medical History-  Patient Active Problem List   Diagnosis Date Noted  . Hypothyroidism, postsurgical 07/25/2016  . Hypoparathyroidism ([place]) 07/25/2016  . Tobacco abuse disorder 07/25/2016  . Hypocalcemia 07/25/2016  . Chest tightness or pressure 06/06/2011  . Hyperlipemia 06/06/2011    Medications- reviewed and updated Current Outpatient Prescriptions  Medication Sig Dispense Refill  . Acetaminophen-Caffeine (EXCEDRIN TENSION HEADACHE PO) Take 1-2 tablets by mouth as needed. headache    . albuterol (PROVENTIL HFA;VENTOLIN HFA) 108 (90 Base) MCG/ACT inhaler Inhale 1-2 puffs into the lungs every 6 (six) hours as needed for wheezing or shortness of breath. 1 Inhaler 1  . aspirin 81 MG tablet Take 81 mg by mouth daily.    Marland Kitchen BIOTIN PO Take by mouth.    . calcium carbonate (OS-CAL) 600 MG TABS Take 600 mg by mouth daily with breakfast.     . estradiol (ESTRACE) 2 MG tablet Take 2 mg by mouth 2 (two) times daily.     Marland Kitchen HYDROcodone-homatropine (HYCODAN) 5-1.5 MG/5ML syrup Take 5 mLs by mouth every 12 (twelve) hours as needed for cough. 120 mL 0  . levothyroxine (SYNTHROID) 100 MCG tablet Take 1 tablet (100 mcg total) by mouth daily before breakfast. 30 tablet 11  . nitroGLYCERIN (NITROSTAT) 0.4 MG SL tablet Place 1 tablet (0.4 mg total) under the tongue every 5 (five) minutes as needed for chest pain. 25 tablet 6  . ranitidine (ZANTAC) 75 MG tablet Take 75 mg by mouth 2 (two) times daily.    . simvastatin (ZOCOR) 80 MG tablet TAKE 1 TABLET BY MOUTH AT BEDTIME 30 tablet 11  . benzonatate (TESSALON) 100 MG capsule Take 1 capsule (100 mg total) by mouth every 8 (eight) hours. (Patient not taking: Reported on 06/09/2017) 21 capsule 0   Objective: BP 138/80 (BP Location: Left  Arm, Patient Position: Sitting, Cuff Size: Normal)   Pulse 65   Temp 98.8 F (37.1 C) (Oral)   Ht 5\' 2"  (1.575 m)   Wt 123 lb 3.2 oz (55.9 kg)   SpO2 94%   BMI 22.53 kg/m  Gen: NAD, resting comfortably CV: RRR no murmurs rubs or gallops Lungs: intermittent wheeze, has some rhonchi No accessory muscle use. Respiratory rate 16-20.  Ext: no edema Skin: warm, dry  Assessment/Plan:  Acute bronchitis, unspecified organism S: Patient was seen at urgent care on 06/04/17 with cough, congestion. Had reassuring CXR with no pneumonia. She had green sputum, nasal congesiton- thought possible URI but with smoking history placed on azithromycin. She followed up 2 days later with Dr. Martinique as she was not improving and was given steroid injection. She states despite this she continues to have wheezing, coughing fits, and feels slightly more winded than her baseline with activity. She has green sputum more than when she was last seen. She has not been smoking in this time frame. Albuterol helps but only for a few hours. She considered going to the ER today A/P:  Given her smoking history and seveirty of described symptoms, will treat her with augmentin for bronchitis in persont hat possibly could have underlying COPD. Will also redose her prednisone with continued wheeze. With good response to albuterol advised she can take medication every 4 hours with up to every 2 as needed. She is relieved  by this. She does not appear to be in acute respiratory distress and has good oxygen saturations so believe we can trial above steps and proceed to hospital only if singifiantly worsening- advised follow up Monday with Dr. Martinique  We also discussed certainly a chance this is viral and will take 3-6 weeks to resolve.   Meds ordered this encounter  Medications  . amoxicillin-clavulanate (AUGMENTIN) 875-125 MG tablet    Sig: Take 1 tablet by mouth 2 (two) times daily.    Dispense:  14 tablet    Refill:  0  .  predniSONE (DELTASONE) 20 MG tablet    Sig: Take 2 pills for 3 days, 1 pill for 4 days    Dispense:  10 tablet    Refill:  0    Return precautions advised.  Garret Reddish, MD

## 2017-06-09 NOTE — Telephone Encounter (Signed)
Pt states she is having severe coughing.  Abx are gone from UC.  Pt cannot take the hydrocodone cough syrup  During the day, because it make her sleepy.  Coughing up yellow green.  Pt states she feels like she is getting worse instead of better.  Pt could hardly speak to me very long without going into a coughing spell. No appt today here.  Please advise    [id number] - [place], [place]

## 2017-06-09 NOTE — Telephone Encounter (Signed)
Patient is going to come in and see Dr. Yong Channel at [facility] today.

## 2017-06-12 ENCOUNTER — Ambulatory Visit (INDEPENDENT_AMBULATORY_CARE_PROVIDER_SITE_OTHER): Payer: BLUE CROSS/BLUE SHIELD | Admitting: Family Medicine

## 2017-06-12 ENCOUNTER — Encounter: Payer: Self-pay | Admitting: Family Medicine

## 2017-06-12 VITALS — BP 130/70 | HR 98 | Resp 12 | Ht 62.0 in | Wt 124.5 lb

## 2017-06-12 DIAGNOSIS — R05 Cough: Secondary | ICD-10-CM

## 2017-06-12 DIAGNOSIS — R059 Cough, unspecified: Secondary | ICD-10-CM

## 2017-06-12 DIAGNOSIS — J989 Respiratory disorder, unspecified: Secondary | ICD-10-CM | POA: Insufficient documentation

## 2017-06-12 DIAGNOSIS — R0989 Other specified symptoms and signs involving the circulatory and respiratory systems: Secondary | ICD-10-CM | POA: Diagnosis not present

## 2017-06-12 DIAGNOSIS — K219 Gastro-esophageal reflux disease without esophagitis: Secondary | ICD-10-CM | POA: Diagnosis not present

## 2017-06-12 MED ORDER — BUDESONIDE-FORMOTEROL FUMARATE 160-4.5 MCG/ACT IN AERO: 2.0000 | INHALATION_SPRAY | Freq: Two times a day (BID) | RESPIRATORY_TRACT | 1 refills | Status: DC

## 2017-06-12 MED ORDER — HYDROCODONE-HOMATROPINE 5-1.5 MG/5ML PO SYRP: 5.0000 mL | ORAL_SOLUTION | Freq: Two times a day (BID) | ORAL | 0 refills | Status: AC | PRN

## 2017-06-12 NOTE — Patient Instructions (Signed)
A few things to remember from today's visit:   Cough - Plan: HYDROcodone-homatropine (HYCODAN) 5-1.5 MG/5ML syrup  Increase Zantac to 150 mg. Symbicort added 2 times daily. Hycodan 2 times daily max.  Adequate hydration.   Please be sure medication list is accurate. If a new problem present, please set up appointment sooner than planned today.

## 2017-06-12 NOTE — Progress Notes (Signed)
HPI:  ACUTE VISIT:  Chief Complaint  Patient presents with  . Follow-up    [name] is a 56 y.o. female, who is here today to follow up on acute recent visit.  I saw her on 06/06/17 with respiratory symptoms, at that time she was already on abx treatment. Hycodan was added to treatment to help with cough at night.  She was seen again on 06/09/17 because persistent symptoms. Dx with acute bronchitis. Started on Augmentin and Prednisone taper.  She is tolerating medications well, has not noted side effects.  Still taking Hycodan , which helps with cough, she would like a refill today.  She has Albuterol inh, which she uses as needed.  Cough  This is a new problem. The current episode started 1 to 4 weeks ago. The problem has been gradually improving. The problem occurs constantly. The cough is productive of sputum. Associated symptoms include heartburn, myalgias, nasal congestion, postnasal drip, rhinorrhea and wheezing. Pertinent negatives include no chills, ear congestion, ear pain, fever, hemoptysis, rash, sore throat, shortness of breath or sweats. Exacerbated by: heat. Risk factors for lung disease include smoking/tobacco exposure. She has tried a beta-agonist inhaler, oral steroids and prescription cough suppressant for the symptoms. The treatment provided mild relief. Her past medical history is significant for environmental allergies.   + GERD, she has noted some heartburn , "indigestion", better for the past 2 days. She is taking Ranitidine 75 mg bid. She has not smoked in about a week, she is using Nicotine gum.   Review of Systems  Constitutional: Positive for fatigue. Negative for chills and fever.  HENT: Positive for postnasal drip and rhinorrhea. Negative for congestion, ear pain, facial swelling, mouth sores, sinus pain, sore throat, trouble swallowing and voice change.   Respiratory: Positive for cough and wheezing. Negative for hemoptysis, chest  tightness and shortness of breath.   Gastrointestinal: Positive for heartburn. Negative for abdominal pain, diarrhea, nausea and vomiting.  Musculoskeletal: Positive for myalgias. Negative for gait problem.  Skin: Negative for rash.  Allergic/Immunologic: Positive for environmental allergies.  Neurological: Negative for syncope and weakness.  Hematological: Negative for adenopathy. Does not bruise/bleed easily.  Psychiatric/Behavioral: Positive for sleep disturbance. Negative for confusion. The patient is nervous/anxious.       Current Outpatient Prescriptions on File Prior to Visit  Medication Sig Dispense Refill  . Acetaminophen-Caffeine (EXCEDRIN TENSION HEADACHE PO) Take 1-2 tablets by mouth as needed. headache    . albuterol (PROVENTIL HFA;VENTOLIN HFA) 108 (90 Base) MCG/ACT inhaler Inhale 1-2 puffs into the lungs every 6 (six) hours as needed for wheezing or shortness of breath. 1 Inhaler 1  . amoxicillin-clavulanate (AUGMENTIN) 875-125 MG tablet Take 1 tablet by mouth 2 (two) times daily. 14 tablet 0  . aspirin 81 MG tablet Take 81 mg by mouth daily.    Marland Kitchen BIOTIN PO Take by mouth.    . calcium carbonate (OS-CAL) 600 MG TABS Take 600 mg by mouth daily with breakfast.     . estradiol (ESTRACE) 2 MG tablet Take 2 mg by mouth 2 (two) times daily.     Marland Kitchen levothyroxine (SYNTHROID) 100 MCG tablet Take 1 tablet (100 mcg total) by mouth daily before breakfast. 30 tablet 11  . nitroGLYCERIN (NITROSTAT) 0.4 MG SL tablet Place 1 tablet (0.4 mg total) under the tongue every 5 (five) minutes as needed for chest pain. 25 tablet 6  . predniSONE (DELTASONE) 20 MG tablet Take 2 pills for 3 days, 1 pill  for 4 days 10 tablet 0  . ranitidine (ZANTAC) 75 MG tablet Take 75 mg by mouth 2 (two) times daily.    . simvastatin (ZOCOR) 80 MG tablet TAKE 1 TABLET BY MOUTH AT BEDTIME 30 tablet 11   No current facility-administered medications on file prior to visit.      Past Medical History:  Diagnosis Date    . 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 This was associated with  Subsequent to that time she has had repeat laparoscopies for chronic, particularly right sided, pelvic pain on two occasions, one in 2000 and again in June of 2003.  At each time, pelvic adhesions were noted but no other significant pelvic pathology  . Adenomyosis    uterine adenomyosis, in 1995 total abdominal hysterectomy, right ovarian cystectomy  . Cancer ([place])    melanoma - right leg - good now  . GERD (gastroesophageal reflux disease)   . Headache    Migraines  . Hypercholesterolemia   . Hyperlipidemia   . Hypothyroidism   . Other and unspecified ovarian cysts    long history of recurring ovarian cysts  . Right bundle branch block   . Thyroid disease   . Tobacco abuse   . 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 Ulcer disease    currently asymptomatic and not on medication   Allergies  Allergen Reactions  . Promethazine Hcl Anaphylaxis, Hives and Other (See Comments)    Lungs swell and cant breathe    Social History   Social History  . Marital status: Married    Spouse name: N/A  . Number of children: N/A  . Years of education: N/A   Social History Main Topics  . Smoking status: Former Smoker    Packs/day: 0.50    Years: 19.00    Types: Cigarettes    Quit date: 06/05/2017  . Smokeless tobacco: Never Used  . Alcohol use Yes     Comment: Drinks wine occas  . Drug use: No  . Sexual activity: Yes    Birth control/ protection: Surgical   Other Topics Concern  . None   Social History Narrative   She is married with three children, and she has five sibling who are all still living.    Vitals:   06/12/17 1426  BP: 130/70  Pulse: 98  Resp: 12  O2 sat at RA 97% Body mass index is 22.77 kg/m.  Physical Exam  Nursing note and vitals reviewed. Constitutional: She is oriented to person, place, and time. She appears well-developed and  well-nourished. She does not appear ill. No distress.  HENT:  Head: Atraumatic.  Nose: Right sinus exhibits no maxillary sinus tenderness and no frontal sinus tenderness. Left sinus exhibits no maxillary sinus tenderness and no frontal sinus tenderness.  Mouth/Throat: Uvula is midline, oropharynx is clear and moist and mucous membranes are normal.  Mild dysphonia.  Eyes: Conjunctivae and EOM are normal.  Cardiovascular: Normal rate and regular rhythm.   No murmur heard. Respiratory: Effort normal. No respiratory distress. She has wheezes (mild, bilateral). She has no rales.  GI: Soft. She exhibits no mass. There is no tenderness.  Musculoskeletal: She exhibits no edema.  Lymphadenopathy:    She has no cervical adenopathy.  Neurological: She is alert and oriented to person, place, and time. She has normal strength. Gait normal.  Skin: Skin is warm. No rash noted. No erythema.  Psychiatric: Her mood appears anxious.  Well groomed,  good eye contact.    ASSESSMENT AND PLAN:   Christina Watts was seen today for follow-up.  Diagnoses and all orders for this visit:  Reactive airway disease that is not asthma  ? COPD. Congratulated for smoking cessation, encouraged to continue. She will complete Prednisone taper and start Symbicort daily bid. Continue Albuterol inh prn. Complete abx treatment.  Instructed about warning signs.  -     budesonide-formoterol (SYMBICORT) 160-4.5 MCG/ACT inhaler; Inhale 2 puffs into the lungs 2 (two) times daily.  Cough  Explained that cough can actually last weeks and even months.  CXR 06/04/17 negative. Some side effects of Hycodan discussed. Symbicort may also help.  -     HYDROcodone-homatropine (HYCODAN) 5-1.5 MG/5ML syrup; Take 5 mLs by mouth every 12 (twelve) hours as needed for cough.  Gastroesophageal reflux disease, esophagitis presence not specified  Could aggravate cough. She agrees with increasing dose of Ranitidine from 75 mg bid to 150 mg  bid. GERD precautions discussed.      -[name] was advised to seek immediate medical attention is symptoms suddenly get worse. Otherwise I will see her back in 2 weeks. Note for work provided     Christina Watts G. Martinique, MD  [facility]. [place] office.

## 2017-06-15 ENCOUNTER — Encounter: Payer: Self-pay | Admitting: Family Medicine

## 2017-06-25 NOTE — [facility] Notes (Signed)
HPI:   [name] is a 56 y.o. female, who is here today to follow on recent OV.   She was seen on 06/12/17, when she was c/o persistent cough and wheezing. She has completed 2 abx courses, Azithromycin and Augmentin. Prednisone taper.  Last OV I recommended Symbicort 160-4.5 mcg bid. Hx of GERD, Zantac was increased.  Occasional non productive cough. She has not had dyspnea or wheezing. She does not feels like she needs to continue Symbicort.  She has quit smoking.   Overall she is feeling greatly better, no new respiratory symptom reported.   -Nipple tenderness for the past 2-3 days. She has not noted breast masses or nipple discharge. No Hx of trauma. She had similar symptoms a few years ago, after hysterectomy. She is on hormonal therapy.  She has appt with her gyn in a couple weeks.  Review of Systems  Constitutional: Negative for activity change, appetite change, fatigue and fever.  HENT: Negative for mouth sores, sinus pressure, sore throat, trouble swallowing and voice change.   Respiratory: Positive for cough. Negative for chest tightness, shortness of breath and wheezing.   Cardiovascular: Negative for leg swelling.  Gastrointestinal: Negative for abdominal pain, nausea and vomiting.  Endocrine: Negative for cold intolerance and heat intolerance.  Genitourinary: Negative for pelvic pain, vaginal bleeding and vaginal discharge.  Skin: Negative for rash.  Allergic/Immunologic: Positive for environmental allergies.  Neurological: Negative for syncope, weakness and headaches.  Hematological: Negative for adenopathy.  Psychiatric/Behavioral: Negative for confusion and sleep disturbance.      Current Outpatient Prescriptions on File Prior to Visit  Medication Sig Dispense Refill  . Acetaminophen-Caffeine (EXCEDRIN TENSION HEADACHE PO) Take 1-2 tablets by mouth as needed. headache    . albuterol (PROVENTIL HFA;VENTOLIN HFA) 108 (90 Base)  MCG/ACT inhaler Inhale 1-2 puffs into the lungs every 6 (six) hours as needed for wheezing or shortness of breath. 1 Inhaler 1  . aspirin 81 MG tablet Take 81 mg by mouth daily.    Marland Kitchen BIOTIN PO Take by mouth.    . budesonide-formoterol (SYMBICORT) 160-4.5 MCG/ACT inhaler Inhale 2 puffs into the lungs 2 (two) times daily. 1 Inhaler 1  . calcium carbonate (OS-CAL) 600 MG TABS Take 600 mg by mouth daily with breakfast.     . estradiol (ESTRACE) 2 MG tablet Take 2 mg by mouth 2 (two) times daily.     Marland Kitchen levothyroxine (SYNTHROID) 100 MCG tablet Take 1 tablet (100 mcg total) by mouth daily before breakfast. 30 tablet 11  . nitroGLYCERIN (NITROSTAT) 0.4 MG SL tablet Place 1 tablet (0.4 mg total) under the tongue every 5 (five) minutes as needed for chest pain. 25 tablet 6  . ranitidine (ZANTAC) 75 MG tablet Take 75 mg by mouth 2 (two) times daily.    . simvastatin (ZOCOR) 80 MG tablet TAKE 1 TABLET BY MOUTH AT BEDTIME 30 tablet 11   No current facility-administered medications on file prior to visit.      Past Medical History:  Diagnosis Date  . 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 This was associated with  Subsequent to that time she has had repeat laparoscopies for chronic, particularly right sided, pelvic pain on two occasions, one in 2000 and again in June of 2003.  At each time, pelvic adhesions were noted but no other significant pelvic pathology  . Adenomyosis    uterine adenomyosis, in 1995 total abdominal hysterectomy, right ovarian cystectomy  . Cancer ([place])  melanoma - right leg - good now  . GERD (gastroesophageal reflux disease)   . Headache    Migraines  . Hypercholesterolemia   . Hyperlipidemia   . Hypothyroidism   . Other and unspecified ovarian cysts    long history of recurring ovarian cysts  . Right bundle branch block   . Thyroid disease   . Tobacco abuse   . 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 Ulcer disease     currently asymptomatic and not on medication   Allergies  Allergen Reactions  . Promethazine Hcl Anaphylaxis, Hives and Other (See Comments)    Lungs swell and cant breathe    Social History   Social History  . Marital status: Married    Spouse name: N/A  . Number of children: N/A  . Years of education: N/A   Social History Main Topics  . Smoking status: Former Smoker    Packs/day: 0.50    Years: 19.00    Types: Cigarettes    Quit date: 06/05/2017  . Smokeless tobacco: Never Used  . Alcohol use Yes     Comment: Drinks wine occas  . Drug use: No  . Sexual activity: Yes    Birth control/ protection: Surgical   Other Topics Concern  . None   Social History Narrative   She is married with three children, and she has five sibling who are all still living.    Vitals:   06/26/17 1402  BP: 110/70  Pulse: 71  Resp: 12  O2 sat at RA 94% Body mass index is 22.86 kg/m.   Physical Exam  Nursing note and vitals reviewed. Constitutional: She is oriented to person, place, and time. She appears well-developed and well-nourished. No distress.  HENT:  Head: Atraumatic.  Mouth/Throat: Oropharynx is clear and moist and mucous membranes are normal.  Eyes: Conjunctivae and EOM are normal.  Cardiovascular: Normal rate and regular rhythm.   No murmur heard. Respiratory: Effort normal and breath sounds normal. No respiratory distress.  Genitourinary: There is breast tenderness. No breast swelling or discharge.  Genitourinary Comments: Fibrocystic changes upper outer quadrants bilateral. Mild tenderness upon palpation of nipples bilateral, no discharge or skin changes.  Lymphadenopathy:    She has no cervical adenopathy.    She has no axillary adenopathy.  Neurological: She is alert and oriented to person, place, and time. She has normal strength. Gait normal.  Skin: Skin is warm. No rash noted. No erythema.  Psychiatric: She has a normal mood and affect.  Well groomed, good eye  contact.     ASSESSMENT AND PLAN:   [name] was seen today for follow-up.  Diagnoses and all orders for this visit:  Reactive airway disease that is not asthma  Resolved. Congratulated for smoking cessation. Recommend continuing Symbicort for 2 months and discontinue. F/U as needed.  Nipple tenderness  We dicussed possible causes.  Medications side effects reviewed, Zantac could cause breast tenderness (increases prolactin) but she is not having nipple discharge. Decrease Zantac to her usual dose, 25 mg bid. Monitor for now symptoms. She prefers to wait on Dx mammogram until she sees her gyn.     Christina Watts G. Martinique, MD  [facility]. [place] office.

## 2017-06-26 ENCOUNTER — Ambulatory Visit (INDEPENDENT_AMBULATORY_CARE_PROVIDER_SITE_OTHER): Payer: BLUE CROSS/BLUE SHIELD | Admitting: Family Medicine

## 2017-06-26 ENCOUNTER — Encounter: Payer: Self-pay | Admitting: Family Medicine

## 2017-06-26 VITALS — BP 110/70 | HR 71 | Resp 12 | Ht 62.0 in | Wt 125.0 lb

## 2017-06-26 DIAGNOSIS — N644 Mastodynia: Secondary | ICD-10-CM | POA: Diagnosis not present

## 2017-06-26 DIAGNOSIS — R0989 Other specified symptoms and signs involving the circulatory and respiratory systems: Secondary | ICD-10-CM | POA: Diagnosis not present

## 2017-06-26 DIAGNOSIS — J989 Respiratory disorder, unspecified: Secondary | ICD-10-CM

## 2017-06-26 NOTE — Patient Instructions (Signed)
A few things to remember from today's visit:   Reactive airway disease that is not asthma  Nipple tenderness  Please let me know if you decide to have Diag mammogram. Follow with gyn. Continue Symbicort for 2-3 months.     CONGRATULATIONS!!! For keeping up with smoking cessation. Please be sure medication list is accurate. If a new problem present, please set up appointment sooner than planned today.

## 2017-06-28 ENCOUNTER — Encounter: Payer: Self-pay | Admitting: Family Medicine

## 2017-07-01 ENCOUNTER — Other Ambulatory Visit: Payer: Self-pay | Admitting: Cardiovascular Disease

## 2017-07-20 ENCOUNTER — Encounter: Payer: Self-pay | Admitting: Cardiovascular Disease

## 2017-07-20 ENCOUNTER — Ambulatory Visit (INDEPENDENT_AMBULATORY_CARE_PROVIDER_SITE_OTHER): Payer: BLUE CROSS/BLUE SHIELD | Admitting: Cardiovascular Disease

## 2017-07-20 VITALS — BP 144/80 | HR 84 | Ht 62.0 in | Wt 125.8 lb

## 2017-07-20 DIAGNOSIS — E785 Hyperlipidemia, unspecified: Secondary | ICD-10-CM

## 2017-07-20 NOTE — Patient Instructions (Addendum)
Medication Instructions:  Your physician recommends that you continue on your current medications as directed. Please refer to the Current Medication list given to you today.  Labwork: Your physician recommends that you have lab work today: LIPID and LIVER  Testing/Procedures: No new orders.   Follow-Up: Your physician wants you to follow-up in: 1 YEAR with Dr Burt Knack. You will receive a reminder letter in the mail two months in advance. If you don't receive a letter, please call our office to schedule the follow-up appointment.   Any Other Special Instructions Will Be Listed Below (If Applicable).  Your physician has requested that you regularly monitor and record your blood pressure readings at home. Please use the same machine at the same time of day to check your readings and record them to bring to your follow-up visit. Please contact the office if your BP is consistently greater than 140/85.   If you need a refill on your cardiac medications before your next appointment, please call your pharmacy.

## 2017-07-20 NOTE — [facility] Notes (Signed)
Cardiology Office Note Date:  07/20/2017   ID:  Christina Watts, Christina Watts 04/05/1961, MRN [id number]  PCP:  Martinique, Betty G, MD  Cardiologist:  [name], MD    Chief Complaint  Patient presents with  . Follow-up     History of Present Illness: Christina Watts is a 56 y.o. female who presents for Follow-up evaluation. The patient has a history of chest pain with normal coronary arteries. She does have a family history of premature CAD. She's been followed for hyperlipidemia and has a history of tobacco use.  The patient is here alone today. She is feeling well. She is fasting for lab work. She denies chest pain, chest pressure, or shortness of breath. She quit smoking in early June. She is staying active.  Past Medical History:  Diagnosis Date  . 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 This was associated with  Subsequent to that time she has had repeat laparoscopies for chronic, particularly right sided, pelvic pain on two occasions, one in 2000 and again in June of 2003.  At each time, pelvic adhesions were noted but no other significant pelvic pathology  . Adenomyosis    uterine adenomyosis, in 1995 total abdominal hysterectomy, right ovarian cystectomy  . Cancer ([place])    melanoma - right leg - good now  . GERD (gastroesophageal reflux disease)   . Headache    Migraines  . Hypercholesterolemia   . Hyperlipidemia   . Hypothyroidism   . Other and unspecified ovarian cysts    long history of recurring ovarian cysts  . Right bundle branch block   . Thyroid disease   . Tobacco abuse   . 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 Ulcer disease    currently asymptomatic and not on medication    Past Surgical History:  Procedure Laterality Date  . ABDOMINAL HYSTERECTOMY    . APPENDECTOMY     for obliteration of the appendiceal tip  . BREAST BIOPSY  02/1992   biopsy of benign breast mass  . CARDIAC CATHETERIZATION  N/A 05/14/2015   Procedure: Left Heart Cath and Coronary Angiography;  Surgeon: [name], MD;  Location: [facility];  Service: Cardiovascular;  Laterality: N/A;  . ESOPHAGOGASTRODUODENOSCOPY  12/06/2005   Normal esophagogastroduodenoscopy --  Earle Gell, M.D.  . HARDWARE REMOVAL Left 04/19/2017   Procedure: HARDWARE REMOVAL LEFT ANKLE;  Surgeon: Earlie Server, MD;  Location: [facility];  Service: Orthopedics;  Laterality: Left;  . left ankle surgery    . OVARIAN CYST REMOVAL  1995  . TOTAL ABDOMINAL HYSTERECTOMY W/ BILATERAL SALPINGOOPHORECTOMY  1995  . TOTAL THYROIDECTOMY  05/13/2004   Multiple thyroid nodules, dominant mass, left thyroid lobe -- by, Earnstine Regal, M.D  . TUBAL LIGATION  1989    Current Outpatient Prescriptions  Medication Sig Dispense Refill  . Acetaminophen-Caffeine (EXCEDRIN TENSION HEADACHE PO) Take 1-2 tablets by mouth as needed. headache    . albuterol (PROVENTIL HFA;VENTOLIN HFA) 108 (90 Base) MCG/ACT inhaler Inhale 1-2 puffs into the lungs every 6 (six) hours as needed for wheezing or shortness of breath. 1 Inhaler 1  . aspirin 81 MG tablet Take 81 mg by mouth daily.    Marland Kitchen BIOTIN PO Take 1 Dose by mouth daily.     . budesonide-formoterol (SYMBICORT) 160-4.5 MCG/ACT inhaler Inhale 2 puffs into the lungs 2 (two) times daily. 1 Inhaler 1  . calcium carbonate (OS-CAL) 600 MG TABS Take  600 mg by mouth daily with breakfast.     . estradiol (ESTRACE) 2 MG tablet Take 2 mg by mouth 2 (two) times daily.     Marland Kitchen levothyroxine (SYNTHROID) 100 MCG tablet Take 1 tablet (100 mcg total) by mouth daily before breakfast. 30 tablet 11  . nitroGLYCERIN (NITROSTAT) 0.4 MG SL tablet Place 1 tablet (0.4 mg total) under the tongue every 5 (five) minutes as needed for chest pain. 25 tablet 6  . ranitidine (ZANTAC) 75 MG tablet Take 75 mg by mouth 2 (two) times daily.    . simvastatin (ZOCOR) 80 MG tablet TAKE 1 TABLET BY MOUTH AT BEDTIME 30 tablet 0   No  current facility-administered medications for this visit.     Allergies:   Promethazine hcl   Social History:  The patient  reports that she quit smoking about 6 weeks ago. Her smoking use included Cigarettes. She has a 9.50 pack-year smoking history. She has never used smokeless tobacco. She reports that she drinks alcohol. She reports that she does not use drugs.   Family History:  The patient's  family history includes Asthma in her mother; Diabetes in her mother; Heart attack in her father; Heart disease in her father and unknown relative; Hyperlipidemia in her unknown relative.    ROS:  Please see the history of present illness.  Otherwise, review of systems is positive for leg swelling, headache.  All other systems are reviewed and negative.    PHYSICAL EXAM: VS:  BP (!) 144/80   Pulse 84   Ht 5\' 2"  (1.575 m)   Wt 125 lb 12.8 oz (57.1 kg)   BMI 23.01 kg/m  , BMI Body mass index is 23.01 kg/m. GEN: Well nourished, well developed, in no acute distress  HEENT: normal  Neck: no JVD, no masses. No carotid bruits Cardiac: RRR without murmur or gallop                Respiratory:  clear to auscultation bilaterally, normal work of breathing GI: soft, nontender, nondistended, + BS MS: no deformity or atrophy  Ext: no pretibial edema, pedal pulses 2+= bilaterally Skin: warm and dry, no rash Neuro:  Strength and sensation are intact Psych: euthymic mood, full affect  EKG:  EKG is not ordered today.  Recent Labs: 12/28/2016: ALT 12; BUN 17; Creatinine, Ser 0.67; Potassium 4.2; Sodium 139; TSH 1.39   Lipid Panel     Component Value Date/Time   CHOL 138 06/15/2016 0731   TRIG 114 06/15/2016 0731   HDL 77 06/15/2016 0731   CHOLHDL 1.8 06/15/2016 0731   VLDL 23 06/15/2016 0731   LDLCALC 38 06/15/2016 0731      Wt Readings from Last 3 Encounters:  07/20/17 125 lb 12.8 oz (57.1 kg)  06/26/17 125 lb (56.7 kg)  06/12/17 124 lb 8 oz (56.5 kg)     ASSESSMENT AND PLAN: 1.   Hyperlipidemia: She's been treated long-term with simvastatin 80 mg. Her last lipids demonstrated a cholesterol of 138, HDL 77, LDL 38. Will update lipids and LFTs today.  2. Tobacco abuse: She is encouraged about her efforts at smoking cessation.  3. Chest pain with angiographically normal coronary arteries: No recurrence of chest discomfort. She seems to be doing well.  Current medicines are reviewed with the patient today.  The patient does not have concerns regarding medicines.  Labs/ tests ordered today include:   Orders Placed This Encounter  Procedures  . Lipid panel  . Hepatic function panel  Disposition:   FU one year  Signed, [name], MD  07/20/2017 [facility] [place], [place], [place]  [address] Phone: [phone]; Fax: [phone]

## 2017-07-21 LAB — LIPID PANEL
Chol/HDL Ratio: 1.9 ratio (ref 0.0–4.4)
Cholesterol, Total: 157 mg/dL (ref 100–199)
HDL: 83 mg/dL (ref >39)
LDL Calculated: 59 mg/dL (ref 0–99)
Triglycerides: 73 mg/dL (ref 0–149)
VLDL Cholesterol Cal: 15 mg/dL (ref 5–40)

## 2017-07-21 LAB — HEPATIC FUNCTION PANEL
ALT: 15 IU/L (ref 0–32)
AST: 24 IU/L (ref 0–40)
Albumin: 3.8 g/dL (ref 3.5–5.5)
Alkaline Phosphatase: 38 IU/L — ABNORMAL LOW (ref 39–117)
Bilirubin Total: 0.2 mg/dL (ref 0.0–1.2)
Bilirubin, Direct: 0.08 mg/dL (ref 0.00–0.40)
Total Protein: 6.3 g/dL (ref 6.0–8.5)

## 2017-07-25 ENCOUNTER — Other Ambulatory Visit: Payer: Self-pay | Admitting: *Deleted

## 2017-07-25 MED ORDER — SIMVASTATIN 80 MG PO TABS: 80.0000 mg | ORAL_TABLET | Freq: Every day | ORAL | 11 refills | Status: DC

## 2017-08-15 DIAGNOSIS — Z1231 Encounter for screening mammogram for malignant neoplasm of breast: Secondary | ICD-10-CM | POA: Diagnosis not present

## 2017-08-16 DIAGNOSIS — Z6823 Body mass index (BMI) 23.0-23.9, adult: Secondary | ICD-10-CM | POA: Diagnosis not present

## 2017-08-16 DIAGNOSIS — Z01419 Encounter for gynecological examination (general) (routine) without abnormal findings: Secondary | ICD-10-CM | POA: Diagnosis not present

## 2017-09-14 ENCOUNTER — Encounter: Payer: Self-pay | Admitting: Family Medicine

## 2017-09-14 DIAGNOSIS — L814 Other melanin hyperpigmentation: Secondary | ICD-10-CM | POA: Diagnosis not present

## 2017-09-14 DIAGNOSIS — D225 Melanocytic nevi of trunk: Secondary | ICD-10-CM | POA: Diagnosis not present

## 2017-09-14 DIAGNOSIS — L821 Other seborrheic keratosis: Secondary | ICD-10-CM | POA: Diagnosis not present

## 2017-09-14 DIAGNOSIS — L988 Other specified disorders of the skin and subcutaneous tissue: Secondary | ICD-10-CM | POA: Diagnosis not present

## 2017-11-20 ENCOUNTER — Other Ambulatory Visit: Payer: Self-pay | Admitting: *Deleted

## 2017-11-20 DIAGNOSIS — E890 Postprocedural hypothyroidism: Secondary | ICD-10-CM

## 2017-11-20 MED ORDER — LEVOTHYROXINE SODIUM 100 MCG PO TABS: 100.0000 ug | ORAL_TABLET | Freq: Every day | ORAL | 11 refills | Status: DC

## 2017-12-27 ENCOUNTER — Other Ambulatory Visit: Payer: Self-pay | Admitting: Cardiovascular Disease

## 2017-12-28 NOTE — Progress Notes (Signed)
HPI:   Christina Watts is a 57 y.o. female, who is here today for her routine physical.  Last CPE: 12/28/16  Regular exercise 3 or more time per week: Not regularly but she has an active job. Following a healthy diet: Yes She lives with her husband.  Chronic medical problems: HLD, RBBB,chest pain with normal coronary arteries,GERD,migraine, tobacco use,hypoparathyroidism, and hypothyroidism among some.  Hypothyroidism,she is on Levothyroxine 100 mg c daily. Last TSH 1.3 in 12/2016.  S/P thyroidectomy, residual hypoparathyroidism.  Lab Results  Component Value Date   CHOL 157 07/20/2017   HDL 83 07/20/2017   LDLCALC 59 07/20/2017   TRIG 73 07/20/2017   CHOLHDL 1.9 07/20/2017    She follows with cardiologist,Dr Burt Knack, last visit 07/20/17. She also sees her gyn regularly, Dr Nail. She sees ENT as needed.   Immunization History  Administered Date(s) Administered  . Influenza,inj,Quad PF,6+ Mos 12/28/2016  . Tdap 12/26/2013    Mammogram: 07/2017 at her gyn's office. Colonoscopy: Dr Earlean Shawl a couple years ago along with EGD (2016).  Resumed smoking but not daily, "Not much." Hep C screening: NR in 12/2016.  Concerns today:  Hx of GERD, she is on Ranitidine 75 mg bid. She follows with GI, Dr Earlean Shawl. States that PPI was discontinued a few yeass ago. Intermittent heartburn,exacerbated  by certain foods.  Denies abdominal pain, nausea, vomiting, changes in bowel habits, blood in stool or melena.  Review of Systems  Constitutional: Negative for appetite change, fatigue, fever and unexpected weight change.  HENT: Negative for hearing loss, mouth sores, sore throat, trouble swallowing and voice change (stable dysphonia).   Eyes: Negative for redness and visual disturbance.  Respiratory: Negative for cough, shortness of breath and wheezing.   Cardiovascular: Negative for chest pain and leg swelling.  Gastrointestinal: Negative for abdominal pain, blood in  stool, nausea and vomiting.       No changes in bowel habits.  Endocrine: Negative for cold intolerance, heat intolerance, polydipsia, polyphagia and polyuria.  Genitourinary: Negative for decreased urine volume, dysuria, hematuria, vaginal bleeding and vaginal discharge.  Musculoskeletal: Negative for gait problem and myalgias.  Skin: Negative for color change and rash.  Allergic/Immunologic: Positive for environmental allergies.  Neurological: Negative for syncope, weakness, numbness and headaches.  Hematological: Negative for adenopathy. Does not bruise/bleed easily.  Psychiatric/Behavioral: Negative for confusion and sleep disturbance. The patient is not nervous/anxious.   All other systems reviewed and are negative.     Current Outpatient Medications on File Prior to Visit  Medication Sig Dispense Refill  . Acetaminophen-Caffeine (EXCEDRIN TENSION HEADACHE PO) Take 1-2 tablets by mouth as needed. headache    . albuterol (PROVENTIL HFA;VENTOLIN HFA) 108 (90 Base) MCG/ACT inhaler Inhale 1-2 puffs into the lungs every 6 (six) hours as needed for wheezing or shortness of breath. 1 Inhaler 1  . aspirin 81 MG tablet Take 81 mg by mouth daily.    Marland Kitchen BIOTIN PO Take 1 Dose by mouth daily.     . budesonide-formoterol (SYMBICORT) 160-4.5 MCG/ACT inhaler Inhale 2 puffs into the lungs 2 (two) times daily. 1 Inhaler 1  . calcium carbonate (OS-CAL) 600 MG TABS Take 600 mg by mouth daily with breakfast.     . estradiol (ESTRACE) 2 MG tablet Take 2 mg by mouth 2 (two) times daily.     . nitroGLYCERIN (NITROSTAT) 0.4 MG SL tablet PLACE 1 TABLET (0.4 MG TOTAL) UNDER THE TONGUE EVERY 5 (FIVE) MINUTES AS NEEDED FOR CHEST PAIN. 25  tablet 0  . ranitidine (ZANTAC) 75 MG tablet Take 75 mg by mouth 2 (two) times daily.    . simvastatin (ZOCOR) 80 MG tablet Take 1 tablet (80 mg total) by mouth at bedtime. 30 tablet 11   No current facility-administered medications on file prior to visit.      Past Medical  History:  Diagnosis Date  . 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 This was associated with  Subsequent to that time she has had repeat laparoscopies for chronic, particularly right sided, pelvic pain on two occasions, one in 2000 and again in June of 2003.  At each time, pelvic adhesions were noted but no other significant pelvic pathology  . Adenomyosis    uterine adenomyosis, in 1995 total abdominal hysterectomy, right ovarian cystectomy  . Cancer ([place])    melanoma - right leg - good now  . GERD (gastroesophageal reflux disease)   . Headache    Migraines  . Hypercholesterolemia   . Hyperlipidemia   . Hypothyroidism   . Other and unspecified ovarian cysts    long history of recurring ovarian cysts  . Right bundle branch block   . Thyroid disease   . Tobacco abuse   . 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 Ulcer disease    currently asymptomatic and not on medication    Past Surgical History:  Procedure Laterality Date  . ABDOMINAL HYSTERECTOMY    . APPENDECTOMY     for obliteration of the appendiceal tip  . BREAST BIOPSY  02/1992   biopsy of benign breast mass  . CARDIAC CATHETERIZATION N/A 05/14/2015   Procedure: Left Heart Cath and Coronary Angiography;  Surgeon: [name], MD;  Location: [facility];  Service: Cardiovascular;  Laterality: N/A;  . ESOPHAGOGASTRODUODENOSCOPY  12/06/2005   Normal esophagogastroduodenoscopy --  Earle Gell, M.D.  . HARDWARE REMOVAL Left 04/19/2017   Procedure: HARDWARE REMOVAL LEFT ANKLE;  Surgeon: Earlie Server, MD;  Location: [facility];  Service: Orthopedics;  Laterality: Left;  . left ankle surgery    . OVARIAN CYST REMOVAL  1995  . TOTAL ABDOMINAL HYSTERECTOMY W/ BILATERAL SALPINGOOPHORECTOMY  1995  . TOTAL THYROIDECTOMY  05/13/2004   Multiple thyroid nodules, dominant mass, left thyroid lobe -- by, Earnstine Regal, M.D  . TUBAL LIGATION  1989    Allergies    Allergen Reactions  . Promethazine Hcl Anaphylaxis, Hives and Other (See Comments)    Lungs swell and cant breathe    Family History  Problem Relation Age of Onset  . Heart disease Father   . Heart attack Father   . Asthma Mother   . Diabetes Mother   . Heart disease Unknown        fathers side has a strong history of heart disease  . Hyperlipidemia Unknown        strong family history of    Social History   Socioeconomic History  . Marital status: Married    Spouse name: None  . Number of children: None  . Years of education: None  . Highest education level: None  Social Needs  . Financial resource strain: None  . Food insecurity - worry: None  . Food insecurity - inability: None  . Transportation needs - medical: None  . Transportation needs - non-medical: None  Occupational History  . None  Tobacco Use  . Smoking status: Current Some Day Smoker    Packs/day: 0.50  Years: 19.00    Pack years: 9.50    Types: Cigarettes    Last attempt to quit: 06/05/2017    Years since quitting: 0.5  . Smokeless tobacco: Never Used  Substance and Sexual Activity  . Alcohol use: Yes    Comment: Drinks wine occas  . Drug use: No  . Sexual activity: Yes    Birth control/protection: Surgical  Other Topics Concern  . None  Social History Narrative   She is married with three children, and she has five sibling who are all still living.     Vitals:   12/29/17 0807  BP: 125/80  Pulse: 76  Resp: 12  Temp: 98.3 F (36.8 C)  SpO2: 99%   Body mass index is 22.73 kg/m.   Wt Readings from Last 3 Encounters:  12/29/17 124 lb 4 oz (56.4 kg)  07/20/17 125 lb 12.8 oz (57.1 kg)  06/26/17 125 lb (56.7 kg)     Physical Exam  Nursing note and vitals reviewed. Constitutional: She is oriented to person, place, and time. She appears well-developed and well-nourished. No distress.  HENT:  Head: Atraumatic.  Right Ear: Hearing, tympanic membrane, external ear and ear canal  normal.  Left Ear: Hearing, tympanic membrane, external ear and ear canal normal.  Mouth/Throat: Uvula is midline, oropharynx is clear and moist and mucous membranes are normal.  Mild dysphonia.  Eyes: Conjunctivae and EOM are normal. Pupils are equal, round, and reactive to light.  Neck: No tracheal deviation present. No thyromegaly present.  Cardiovascular: Normal rate. An irregular rhythm present.  No murmur heard. Pulses:      Posterior tibial pulses are 2+ on the right side, and 2+ on the left side.  Prior EKG's with PVC's. DP pulses present bilateral.  Respiratory: Effort normal and breath sounds normal. No respiratory distress.  GI: Soft. She exhibits no mass. There is no hepatomegaly. There is no tenderness.  Musculoskeletal: She exhibits no edema or tenderness.  No major deformity or signs of synovitis appreciated.  Lymphadenopathy:    She has no cervical adenopathy.       Right: No supraclavicular adenopathy present.       Left: No supraclavicular adenopathy present.  Neurological: She is alert and oriented to person, place, and time. She has normal strength. No cranial nerve deficit. Coordination and gait normal.  Reflex Scores:      Bicep reflexes are 2+ on the right side and 2+ on the left side.      Patellar reflexes are 2+ on the right side and 2+ on the left side. Skin: Skin is warm. No rash noted. No erythema.  Psychiatric: She has a normal mood and affect. Her speech is normal.  Well groomed, good eye contact.     ASSESSMENT AND PLAN:   Ms. Jodene was seen today for annual exam.  Diagnoses and all orders for this visit:  Lab Results  Component Value Date   TSH 1.71 12/29/2017   Lab Results  Component Value Date   CREATININE 0.69 12/29/2017   BUN 15 12/29/2017   NA 139 12/29/2017   K 4.3 12/29/2017   CL 102 12/29/2017   CO2 29 12/29/2017    Routine general medical examination at a health care facility  We discussed the importance of regular physical  activity and healthy diet for prevention of chronic illness and/or complications. Preventive guidelines reviewed. She will continue following with her gyn for her routine female preventive care. Vaccination up to date. Aspirin for  primary prevention to continue. Smoking cessation strongly recommended. Ca++ and vit D supplementation recommended. Next CPE in a year.  The 10-year ASCVD risk score Mikey Bussing DC Brooke Bonito., et al., 2013) is: 3%   Values used to calculate the score:     Age: 69 years     Sex: Female     Is Non-Hispanic African American: No     Diabetic: No     Tobacco smoker: Yes     Systolic Blood Pressure: 801 mmHg     Is BP treated: No     HDL Cholesterol: 83 mg/dL     Total Cholesterol: 157 mg/dL  Hypothyroidism, postsurgical  No changes in current management, will follow labs done today and will give further recommendations accordingly. F/U in 6-12 months.  -     levothyroxine (SYNTHROID) 100 MCG tablet; Take 1 tablet (100 mcg total) by mouth daily before breakfast. -     TSH  Hypoparathyroidism, unspecified hypoparathyroidism type [facility])  Further recommendations will be given according to labs/imaging results.  DEXA will be arranged.  -     VITAMIN D 25 Hydroxy (Vit-D Deficiency, Fractures) -     Parathyroid hormone, intact (no Ca)  Diabetes mellitus screening -     Basic metabolic panel  Gastroesophageal reflux disease, esophagitis presence not specified  GERD precautions discussed. Nexium 20 mg daily for 3-4 weeks recommended then prn. Some side effects of PPi's discussed. Instructed about warning signs. F/U with GI prn.  -     esomeprazole (NEXIUM) 20 MG capsule; Take 1 capsule (20 mg total) by mouth daily at 12 noon.    Return in 1 year (on 12/29/2018).     Crystall Donaldson G. Martinique, MD  [facility]. [place] office.

## 2017-12-29 ENCOUNTER — Encounter: Payer: Self-pay | Admitting: Family Medicine

## 2017-12-29 ENCOUNTER — Ambulatory Visit (INDEPENDENT_AMBULATORY_CARE_PROVIDER_SITE_OTHER): Payer: BLUE CROSS/BLUE SHIELD | Admitting: Family Medicine

## 2017-12-29 ENCOUNTER — Other Ambulatory Visit: Payer: Self-pay | Admitting: *Deleted

## 2017-12-29 VITALS — BP 125/80 | HR 76 | Temp 98.3°F | Resp 12 | Ht 62.0 in | Wt 124.2 lb

## 2017-12-29 DIAGNOSIS — E209 Hypoparathyroidism, unspecified: Secondary | ICD-10-CM

## 2017-12-29 DIAGNOSIS — E890 Postprocedural hypothyroidism: Secondary | ICD-10-CM | POA: Diagnosis not present

## 2017-12-29 DIAGNOSIS — Z131 Encounter for screening for diabetes mellitus: Secondary | ICD-10-CM | POA: Diagnosis not present

## 2017-12-29 DIAGNOSIS — K219 Gastro-esophageal reflux disease without esophagitis: Secondary | ICD-10-CM

## 2017-12-29 DIAGNOSIS — Z0000 Encounter for general adult medical examination without abnormal findings: Secondary | ICD-10-CM | POA: Diagnosis not present

## 2017-12-29 LAB — BASIC METABOLIC PANEL
BUN: 15 mg/dL (ref 6–23)
CO2: 29 mEq/L (ref 19–32)
Calcium: 8.2 mg/dL — ABNORMAL LOW (ref 8.4–10.5)
Chloride: 102 mEq/L (ref 96–112)
Creatinine, Ser: 0.69 mg/dL (ref 0.40–1.20)
GFR: 92.91 mL/min (ref >60.00)
Glucose, Bld: 82 mg/dL (ref 70–99)
Potassium: 4.3 mEq/L (ref 3.5–5.1)
Sodium: 139 mEq/L (ref 135–145)

## 2017-12-29 LAB — VITAMIN D 25 HYDROXY (VIT D DEFICIENCY, FRACTURES): VITD: 55.64 ng/mL (ref 30.00–100.00)

## 2017-12-29 LAB — TSH: TSH: 1.71 u[IU]/mL (ref 0.35–4.50)

## 2017-12-29 MED ORDER — OMEPRAZOLE 20 MG PO CPDR: 20.0000 mg | DELAYED_RELEASE_CAPSULE | Freq: Every day | ORAL | 1 refills | Status: DC

## 2017-12-29 MED ORDER — LEVOTHYROXINE SODIUM 100 MCG PO TABS: 100.0000 ug | ORAL_TABLET | Freq: Every day | ORAL | 11 refills | Status: DC

## 2017-12-29 MED ORDER — ESOMEPRAZOLE MAGNESIUM 20 MG PO CPDR: 20.0000 mg | DELAYED_RELEASE_CAPSULE | Freq: Every day | ORAL | 1 refills | Status: DC

## 2017-12-29 NOTE — Patient Instructions (Addendum)
A few things to remember from today's visit:   Routine general medical examination at a health care facility  Hypothyroidism, postsurgical - Plan: levothyroxine (SYNTHROID) 100 MCG tablet, TSH  Hypoparathyroidism, unspecified hypoparathyroidism type ([place]) - Plan: VITAMIN D 25 Hydroxy (Vit-D Deficiency, Fractures), Parathyroid hormone, intact (no Ca)  Diabetes mellitus screening - Plan: Basic metabolic panel  Gastroesophageal reflux disease, esophagitis presence not specified - Plan: esomeprazole (NEXIUM) 20 MG capsule  Today you have you routine preventive visit.  At least 150 minutes of moderate exercise per week, daily brisk walking for 15-30 min is a good exercise option. Healthy diet low in saturated (animal) fats and sweets and consisting of fresh fruits and vegetables, lean meats such as fish and white chicken and whole grains.  These are some of recommendations for screening depending of age and risk factors:   - Vaccines:  Tdap vaccine every 10 years.  Shingles vaccine recommended at age 55, could be given after 58 years of age but not sure about insurance coverage.   Pneumonia vaccines:  Prevnar 13 at 65 and Pneumovax at 24. Sometimes Pneumovax is giving earlier if history of smoking, lung disease,diabetes,kidney disease among some.    Screening for diabetes at age 74 and every 3 years.  Cervical cancer prevention:  Pap smear starts at 58 years of age and continues periodically until 58 years old in low risk women. Pap smear every 3 years between 30 and 3 years old. Pap smear every 3-5 years between women 27 and older if pap smear negative and HPV screening negative.   -Breast cancer: Mammogram: There is disagreement between experts about when to start screening in low risk asymptomatic female but recent recommendations are to start screening at 34 and not later than 58 years old , every 1-2 years and after 58 yo q 2 years. Screening is recommended until 58 years old  but some women can continue screening depending of healthy issues.   Colon cancer screening: starts at 58 years old until 58 years old.  Cholesterol disorder screening at age 100 and every 3 years.  Also recommended:  1. Dental visit- Brush and floss your teeth twice daily; visit your dentist twice a year. 2. Eye doctor- Get an eye exam at least every 2 years. 3. Helmet use- Always wear a helmet when riding a bicycle, motorcycle, rollerblading or skateboarding. 4. Safe sex- If you may be exposed to sexually transmitted infections, use a condom. 5. Seat belts- Seat belts can save your live; always wear one. 6. Smoke/Carbon Monoxide detectors- These detectors need to be installed on the appropriate level of your home. Replace batteries at least once a year. 7. Skin cancer- When out in the sun please cover up and use sunscreen 15 SPF or higher. 8. Violence- If anyone is threatening or hurting you, please tell your healthcare provider.  9. Drink alcohol in moderation- Limit alcohol intake to one drink or less per day. Never drink and drive.  Please be sure medication list is accurate. If a new problem present, please set up appointment sooner than planned today.

## 2017-12-31 ENCOUNTER — Encounter: Payer: Self-pay | Admitting: Family Medicine

## 2018-01-01 ENCOUNTER — Encounter: Payer: Self-pay | Admitting: Family Medicine

## 2018-01-01 LAB — PARATHYROID HORMONE, INTACT (NO CA): PTH: 14 pg/mL (ref 14–64)

## 2018-01-12 ENCOUNTER — Ambulatory Visit (INDEPENDENT_AMBULATORY_CARE_PROVIDER_SITE_OTHER)
Admission: RE | Admit: 2018-01-12 | Discharge: 2018-01-12 | Disposition: A | Discharge status: Home / Self Care | Payer: BLUE CROSS/BLUE SHIELD | Source: Ambulatory Visit | Attending: Family Medicine | Admitting: Family Medicine

## 2018-01-12 DIAGNOSIS — E209 Hypoparathyroidism, unspecified: Secondary | ICD-10-CM

## 2018-01-14 DIAGNOSIS — E209 Hypoparathyroidism, unspecified: Secondary | ICD-10-CM | POA: Diagnosis not present

## 2018-01-15 ENCOUNTER — Other Ambulatory Visit: Payer: Self-pay | Admitting: Cardiovascular Disease

## 2018-01-15 MED ORDER — NITROGLYCERIN 0.4 MG SL SUBL: 0.4000 mg | SUBLINGUAL_TABLET | SUBLINGUAL | 1 refills | Status: DC | PRN

## 2018-01-16 ENCOUNTER — Encounter: Payer: Self-pay | Admitting: Family Medicine

## 2018-03-14 DIAGNOSIS — D1801 Hemangioma of skin and subcutaneous tissue: Secondary | ICD-10-CM | POA: Diagnosis not present

## 2018-03-14 DIAGNOSIS — Z85820 Personal history of malignant melanoma of skin: Secondary | ICD-10-CM | POA: Diagnosis not present

## 2018-03-14 DIAGNOSIS — L570 Actinic keratosis: Secondary | ICD-10-CM | POA: Diagnosis not present

## 2018-03-14 DIAGNOSIS — L821 Other seborrheic keratosis: Secondary | ICD-10-CM | POA: Diagnosis not present

## 2018-05-23 DIAGNOSIS — H04121 Dry eye syndrome of right lacrimal gland: Secondary | ICD-10-CM | POA: Diagnosis not present

## 2018-05-23 DIAGNOSIS — H04123 Dry eye syndrome of bilateral lacrimal glands: Secondary | ICD-10-CM | POA: Diagnosis not present

## 2018-05-23 DIAGNOSIS — H2513 Age-related nuclear cataract, bilateral: Secondary | ICD-10-CM | POA: Diagnosis not present

## 2018-05-23 DIAGNOSIS — H04122 Dry eye syndrome of left lacrimal gland: Secondary | ICD-10-CM | POA: Diagnosis not present

## 2018-06-10 ENCOUNTER — Other Ambulatory Visit: Payer: Self-pay | Admitting: Family Medicine

## 2018-08-02 ENCOUNTER — Ambulatory Visit: Payer: Self-pay | Admitting: *Deleted

## 2018-08-02 NOTE — Telephone Encounter (Signed)
Pt called with complaints of being stung on her left arm on 08/01/18 at 1300; she describes it as "swollen, red, "and has heat in it"; home care advice given per nurse triage protocol; the pt verbalizes understanding.     Reason for Disposition . Normal local reaction to bee, wasp, or yellow jacket sting  Answer Assessment - Initial Assessment Questions 1. TYPE: "What type of sting was it?" (bee, yellow jacket, etc.)      Yellow jacket 2. ONSET: "When did it occur?"      08/01/18 at 1300 3. LOCATION: "Where is the sting located?"  "How many stings?"     Left arm between wrist and elbow; thinks only 1 sting 4. SWELLING SIZE: "How big is the swelling?" (inches or centimeters)     4 inches 5. REDNESS: "Is the area red or pink?" If so, ask "What size is area of redness?" (inches or cm). "When did the redness start?"     4 inches; stared 08/02/18 6. PAIN: "Is there any pain?" If so, ask: "How bad is it?"  (Scale 1-10; or mild, moderate, severe)    Sore; "hurts to touch" 7. ITCHING: "Is there any itching?" If so, ask: "How bad is it?"      Yes, severe 8. RESPIRATORY DISTRESS: "Describe your breathing."     no 9. PRIOR REACTIONS: "Have you had any severe allergic reactions to stings in the past?" if yes, ask: "What happened?"     Yes; years ago 10. OTHER SYMPTOMS: "Do you have any other symptoms?" (e.g., face or tongue swelling, new rash elsewhere, abdominal pain, vomiting)       no 11. PREGNANCY: "Is there any chance you are pregnant?" "When was your last menstrual period?"       No hysterectomy  Protocols used: BEE OR YELLOW JACKET STING-A-AH

## 2018-08-03 ENCOUNTER — Other Ambulatory Visit: Payer: Self-pay

## 2018-08-03 ENCOUNTER — Emergency Department (HOSPITAL_BASED_OUTPATIENT_CLINIC_OR_DEPARTMENT_OTHER)
Admission: EM | Admit: 2018-08-03 | Discharge: 2018-08-03 | Disposition: A | Discharge status: Home / Self Care | Payer: BLUE CROSS/BLUE SHIELD | Attending: Emergency Medicine | Admitting: Emergency Medicine

## 2018-08-03 ENCOUNTER — Encounter (HOSPITAL_BASED_OUTPATIENT_CLINIC_OR_DEPARTMENT_OTHER): Payer: Self-pay | Admitting: *Deleted

## 2018-08-03 DIAGNOSIS — W57XXXA Bitten or stung by nonvenomous insect and other nonvenomous arthropods, initial encounter: Secondary | ICD-10-CM | POA: Insufficient documentation

## 2018-08-03 DIAGNOSIS — E039 Hypothyroidism, unspecified: Secondary | ICD-10-CM | POA: Insufficient documentation

## 2018-08-03 DIAGNOSIS — S50862A Insect bite (nonvenomous) of left forearm, initial encounter: Secondary | ICD-10-CM | POA: Diagnosis not present

## 2018-08-03 DIAGNOSIS — Y929 Unspecified place or not applicable: Secondary | ICD-10-CM | POA: Diagnosis not present

## 2018-08-03 DIAGNOSIS — Y939 Activity, unspecified: Secondary | ICD-10-CM | POA: Insufficient documentation

## 2018-08-03 DIAGNOSIS — Y999 Unspecified external cause status: Secondary | ICD-10-CM | POA: Diagnosis not present

## 2018-08-03 DIAGNOSIS — Z79899 Other long term (current) drug therapy: Secondary | ICD-10-CM | POA: Insufficient documentation

## 2018-08-03 DIAGNOSIS — Z7982 Long term (current) use of aspirin: Secondary | ICD-10-CM | POA: Diagnosis not present

## 2018-08-03 DIAGNOSIS — L089 Local infection of the skin and subcutaneous tissue, unspecified: Secondary | ICD-10-CM | POA: Diagnosis not present

## 2018-08-03 MED ORDER — PREDNISONE 50 MG PO TABS
60.0000 mg | ORAL_TABLET | Freq: Once | ORAL | Status: AC
  Administered 2018-08-03: 60 mg via ORAL
  Filled 2018-08-03: qty 1

## 2018-08-03 MED ORDER — PREDNISONE 20 MG PO TABS: 40.0000 mg | ORAL_TABLET | Freq: Every day | ORAL | 0 refills | Status: DC

## 2018-08-03 NOTE — ED Provider Notes (Signed)
[facility] EMERGENCY DEPARTMENT Provider Note   CSN: [id number] Arrival date & time: 08/03/18  2007     History   Chief Complaint Chief Complaint  Patient presents with  . Insect Bite    HPI Christina Watts is a 57 y.o. female.  Patient is a 57 year old female with a history of hyperlipidemia, hypothyroidism, right bundle branch block and melanoma presenting after a insect bite of some sort to her left arm yesterday that she did not see but felt staying and since that time has had redness, itching, warmth and spreading redness.  She states it itches like crazy and is not better with Benadryl.  She denies any fever or infectious symptoms.  There is no pain but there has been swelling around the area where she noticed the stinging   The history is provided by the patient.    Past Medical History:  Diagnosis Date  . 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 This was associated with  Subsequent to that time she has had repeat laparoscopies for chronic, particularly right sided, pelvic pain on two occasions, one in 2000 and again in June of 2003.  At each time, pelvic adhesions were noted but no other significant pelvic pathology  . Adenomyosis    uterine adenomyosis, in 1995 total abdominal hysterectomy, right ovarian cystectomy  . Cancer ([place])    melanoma - right leg - good now  . GERD (gastroesophageal reflux disease)   . Headache    Migraines  . Hypercholesterolemia   . Hyperlipidemia   . Hypothyroidism   . Other and unspecified ovarian cysts    long history of recurring ovarian cysts  . Right bundle branch block   . Thyroid disease   . Tobacco abuse   . 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 Ulcer disease    currently asymptomatic and not on medication    Patient Active Problem List   Diagnosis Date Noted  . 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 GERD (gastroesophageal reflux disease)  06/12/2017  . Hypothyroidism, postsurgical 07/25/2016  . Hypoparathyroidism ([place]) 07/25/2016  . Tobacco abuse disorder 07/25/2016  . Hypocalcemia 07/25/2016  . Chest tightness or pressure 06/06/2011  . Hyperlipemia 06/06/2011    Past Surgical History:  Procedure Laterality Date  . ABDOMINAL HYSTERECTOMY    . APPENDECTOMY     for obliteration of the appendiceal tip  . BREAST BIOPSY  02/1992   biopsy of benign breast mass  . CARDIAC CATHETERIZATION N/A 05/14/2015   Procedure: Left Heart Cath and Coronary Angiography;  Surgeon: [name], MD;  Location: [facility];  Service: Cardiovascular;  Laterality: N/A;  . ESOPHAGOGASTRODUODENOSCOPY  12/06/2005   Normal esophagogastroduodenoscopy --  Earle Gell, M.D.  . HARDWARE REMOVAL Left 04/19/2017   Procedure: HARDWARE REMOVAL LEFT ANKLE;  Surgeon: Earlie Server, MD;  Location: [facility];  Service: Orthopedics;  Laterality: Left;  . left ankle surgery    . OVARIAN CYST REMOVAL  1995  . TOTAL ABDOMINAL HYSTERECTOMY W/ BILATERAL SALPINGOOPHORECTOMY  1995  . TOTAL THYROIDECTOMY  05/13/2004   Multiple thyroid nodules, dominant mass, left thyroid lobe -- by, Earnstine Regal, M.D  . TUBAL LIGATION  1989     OB History   None      Home Medications    Prior to Admission medications   Medication Sig Start Date End Date Taking? Authorizing Provider  Acetaminophen-Caffeine (EXCEDRIN TENSION HEADACHE PO)  Take 1-2 tablets by mouth as needed. headache   Yes [provider]  albuterol (PROVENTIL HFA;VENTOLIN HFA) 108 (90 Base) MCG/ACT inhaler Inhale 1-2 puffs into the lungs every 6 (six) hours as needed for wheezing or shortness of breath. 06/06/17  Yes Martinique, Betty G, MD  aspirin 81 MG tablet Take 81 mg by mouth daily.   Yes [provider]  calcium carbonate (OS-CAL) 600 MG TABS Take 600 mg by mouth daily with breakfast.    Yes [provider]  esomeprazole (NEXIUM) 20 MG capsule Take 1  capsule (20 mg total) by mouth daily at 12 noon. 12/29/17  Yes Martinique, Betty G, MD  estradiol (ESTRACE) 2 MG tablet Take 2 mg by mouth 2 (two) times daily.  05/12/11  Yes [provider]  levothyroxine (SYNTHROID) 100 MCG tablet Take 1 tablet (100 mcg total) by mouth daily before breakfast. 12/29/17  Yes Martinique, Betty G, MD  nitroGLYCERIN (NITROSTAT) 0.4 MG SL tablet Place 1 tablet (0.4 mg total) under the tongue every 5 (five) minutes as needed for chest pain. 01/15/18  Yes [name], MD  omeprazole (PRILOSEC) 20 MG capsule TAKE 1 CAPSULE BY MOUTH EVERY DAY 06/11/18  Yes Martinique, Betty G, MD  ranitidine (ZANTAC) 75 MG tablet Take 75 mg by mouth 2 (two) times daily.   Yes [provider]  simvastatin (ZOCOR) 80 MG tablet Take 1 tablet (80 mg total) by mouth at bedtime. 07/25/17  Yes [name], MD  BIOTIN PO Take 1 Dose by mouth daily.     [provider]  budesonide-formoterol (SYMBICORT) 160-4.5 MCG/ACT inhaler Inhale 2 puffs into the lungs 2 (two) times daily. 06/12/17   Martinique, Betty G, MD    Family History Family History  Problem Relation Age of Onset  . Heart disease Father   . Heart attack Father   . Asthma Mother   . Diabetes Mother   . Heart disease Unknown        fathers side has a strong history of heart disease  . Hyperlipidemia Unknown        strong family history of    Social History Social History   Tobacco Use  . Smoking status: Current Some Day Smoker    Packs/day: 0.50    Years: 19.00    Pack years: 9.50    Types: Cigarettes    Last attempt to quit: 06/05/2017    Years since quitting: 1.1  . Smokeless tobacco: Never Used  Substance Use Topics  . Alcohol use: Yes    Comment: Drinks wine occas  . Drug use: No     Allergies   Promethazine hcl   Review of Systems Review of Systems  All other systems reviewed and are negative.    Physical Exam Updated Vital Signs BP (!) 171/96   Pulse 80   Temp 98.4 F (36.9 C) (Oral)    Resp 18   Ht 5\' 2"  (1.575 m)   Wt 57.2 kg   SpO2 100%   BMI 23.05 kg/m   Physical Exam  Constitutional: She is oriented to person, place, and time. She appears well-developed and well-nourished. No distress.  HENT:  Head: Normocephalic and atraumatic.  Eyes: Pupils are equal, round, and reactive to light.  Cardiovascular: Normal rate.  Pulmonary/Chest: Effort normal.  Musculoskeletal:       Arms: Neurological: She is alert and oriented to person, place, and time.  Skin: Skin is warm and dry.  Psychiatric: She has a normal mood  and affect. Her behavior is normal.  Nursing note and vitals reviewed.    ED Treatments / Results  Labs (all labs ordered are listed, but only abnormal results are displayed) Labs Reviewed - No data to display  EKG None  Radiology No results found.  Procedures Procedures (including critical care time)  Medications Ordered in ED Medications - No data to display   Initial Impression / Assessment and Plan / ED Course  I have reviewed the triage vital signs and the nursing notes.  Pertinent labs & imaging results that were available during my care of the patient were reviewed by me and considered in my medical decision making (see chart for details).     Presenting today with an insect sting of some sort with localized reaction swelling and erythema.  No concern for cellulitis at this time and no symptoms suggestive of abscess.  Will start on prednisone and have patient continue Benadryl.  Final Clinical Impressions(s) / ED Diagnoses   Final diagnoses:  Insect bite of forearm with local reaction, left, initial encounter    ED Discharge Orders         Ordered    predniSONE (DELTASONE) 20 MG tablet  Daily     08/03/18 2032           Blanchie Dessert, MD 08/03/18 2032

## 2018-08-03 NOTE — ED Triage Notes (Signed)
She thinks she has an insect bite to her left forearm. Redness and pain.

## 2018-08-11 ENCOUNTER — Other Ambulatory Visit: Payer: Self-pay | Admitting: Cardiovascular Disease

## 2018-08-20 ENCOUNTER — Encounter: Payer: Self-pay | Admitting: Cardiovascular Disease

## 2018-08-20 ENCOUNTER — Ambulatory Visit: Payer: BLUE CROSS/BLUE SHIELD | Admitting: Cardiovascular Disease

## 2018-08-20 VITALS — BP 138/82 | HR 87 | Ht 62.0 in | Wt 124.0 lb

## 2018-08-20 DIAGNOSIS — E782 Mixed hyperlipidemia: Secondary | ICD-10-CM

## 2018-08-20 NOTE — Patient Instructions (Signed)
Medication Instructions:  Your provider recommends that you continue on your current medications as directed. Please refer to the Current Medication list given to you today.    You can try the lowest dose of Nicotine patch (7mg ). Ask your local pharmacist for further assistance.   Labwork: Please return for fasting labs TOMORROW. You may come any time between 7:45AM and 5:00PM. Don't forget to check in at the front desk!  Testing/Procedures: None   Follow-Up: Your provider wants you to follow-up in: 1 year with Dr. Burt Knack. You will receive a reminder letter in the mail two months in advance. If you don't receive a letter, please call our office to schedule the follow-up appointment.    Any Other Special Instructions Will Be Listed Below (If Applicable).     If you need a refill on your cardiac medications before your next appointment, please call your pharmacy.

## 2018-08-20 NOTE — Progress Notes (Signed)
Cardiology Office Note Date:  08/20/2018   ID:  [name], Ma 1961/03/09, MRN [id number]  PCP:  Martinique, Betty G, MD  Cardiologist:  [name], MD    Chief Complaint  Patient presents with  . Chest Pain   History of Present Illness: Christina Watts is a 57 y.o. female who presents for follow-up evaluation.  The patient has been followed for hyperlipidemia, tobacco abuse, and family history of premature CAD.  She is undergone cardiac catheterization demonstrating normal coronary arteries.  The patient is here alone today.  She has had a few episodes of chest discomfort in the middle of the night that feel like heartburn.  She is otherwise had no problems.  She denies exertional chest pain or shortness of breath.  She is smoking about 5 cigarettes/day.  She is tolerating her medicines well.  She denies edema, orthopnea, PND, or heart palpitations.  She has a lot of stress related to her job.   Past Medical History:  Diagnosis Date  . 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 This was associated with  Subsequent to that time she has had repeat laparoscopies for chronic, particularly right sided, pelvic pain on two occasions, one in 2000 and again in June of 2003.  At each time, pelvic adhesions were noted but no other significant pelvic pathology  . Adenomyosis    uterine adenomyosis, in 1995 total abdominal hysterectomy, right ovarian cystectomy  . Cancer ([place])    melanoma - right leg - good now  . GERD (gastroesophageal reflux disease)   . Headache    Migraines  . Hypercholesterolemia   . Hyperlipidemia   . Hypothyroidism   . Other and unspecified ovarian cysts    long history of recurring ovarian cysts  . Right bundle branch block   . Thyroid disease   . Tobacco abuse   . 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 Ulcer disease    currently asymptomatic and not on medication    Past Surgical History:    Procedure Laterality Date  . ABDOMINAL HYSTERECTOMY    . APPENDECTOMY     for obliteration of the appendiceal tip  . BREAST BIOPSY  02/1992   biopsy of benign breast mass  . CARDIAC CATHETERIZATION N/A 05/14/2015   Procedure: Left Heart Cath and Coronary Angiography;  Surgeon: [name], MD;  Location: [facility];  Service: Cardiovascular;  Laterality: N/A;  . ESOPHAGOGASTRODUODENOSCOPY  12/06/2005   Normal esophagogastroduodenoscopy --  Earle Gell, M.D.  . HARDWARE REMOVAL Left 04/19/2017   Procedure: HARDWARE REMOVAL LEFT ANKLE;  Surgeon: Earlie Server, MD;  Location: [facility];  Service: Orthopedics;  Laterality: Left;  . left ankle surgery    . OVARIAN CYST REMOVAL  1995  . TOTAL ABDOMINAL HYSTERECTOMY W/ BILATERAL SALPINGOOPHORECTOMY  1995  . TOTAL THYROIDECTOMY  05/13/2004   Multiple thyroid nodules, dominant mass, left thyroid lobe -- by, Earnstine Regal, M.D  . TUBAL LIGATION  1989    Current Outpatient Medications  Medication Sig Dispense Refill  . Acetaminophen-Caffeine (EXCEDRIN TENSION HEADACHE PO) Take 1-2 tablets by mouth as needed. headache    . aspirin 81 MG tablet Take 81 mg by mouth daily.    Marland Kitchen BIOTIN PO Take 1 Dose by mouth daily.     . calcium carbonate (OS-CAL) 600 MG TABS Take 600 mg by mouth daily with breakfast.     . esomeprazole (NEXIUM) 20 MG capsule  Take 1 capsule (20 mg total) by mouth daily at 12 noon. 90 capsule 1  . estradiol (ESTRACE) 2 MG tablet Take 2 mg by mouth 2 (two) times daily.     Marland Kitchen levothyroxine (SYNTHROID) 100 MCG tablet Take 1 tablet (100 mcg total) by mouth daily before breakfast. 30 tablet 11  . nitroGLYCERIN (NITROSTAT) 0.4 MG SL tablet Place 1 tablet (0.4 mg total) under the tongue every 5 (five) minutes as needed for chest pain. 75 tablet 1  . omeprazole (PRILOSEC) 20 MG capsule TAKE 1 CAPSULE BY MOUTH EVERY DAY 90 capsule 0  . simvastatin (ZOCOR) 80 MG tablet TAKE 1 TABLET (80 MG TOTAL) BY MOUTH AT  BEDTIME. 30 tablet 0   No current facility-administered medications for this visit.     Allergies:   Promethazine hcl   Social History:  The patient  reports that she has been smoking cigarettes. She has a 9.50 pack-year smoking history. She has never used smokeless tobacco. She reports that she drinks alcohol. She reports that she does not use drugs.   Family History:  The patient's family history includes Asthma in her mother; Diabetes in her mother; Heart attack in her father; Heart disease in her father and unknown relative; Hyperlipidemia in her unknown relative.    ROS:  Please see the history of present illness.  Otherwise, review of systems is positive for lightheadedness, headaches.  All other systems are reviewed and negative.    PHYSICAL EXAM: VS:  BP 138/82   Pulse 87   Ht 5\' 2"  (1.575 m)   Wt 124 lb (56.2 kg)   SpO2 99%   BMI 22.68 kg/m  , BMI Body mass index is 22.68 kg/m. GEN: Well nourished, well developed, in no acute distress  HEENT: normal  Neck: no JVD, no masses. No carotid bruits Cardiac: RRR without murmur or gallop      Respiratory:  clear to auscultation bilaterally, normal work of breathing GI: soft, nontender, nondistended, + BS MS: no deformity or atrophy  Ext: no pretibial edema, pedal pulses 2+= bilaterally Skin: warm and dry, no rash Neuro:  Strength and sensation are intact Psych: euthymic mood, full affect  EKG:  EKG is ordered today. The ekg ordered today shows NSR 87 bpm, occasional PVC, no significant ST-T changes  Recent Labs: 12/29/2017: BUN 15; Creatinine, Ser 0.69; Potassium 4.3; Sodium 139; TSH 1.71   Lipid Panel     Component Value Date/Time   CHOL 157 07/20/2017 1618   TRIG 73 07/20/2017 1618   HDL 83 07/20/2017 1618   CHOLHDL 1.9 07/20/2017 1618   CHOLHDL 1.8 06/15/2016 0731   VLDL 23 06/15/2016 0731   LDLCALC 59 07/20/2017 1618      Wt Readings from Last 3 Encounters:  08/20/18 124 lb (56.2 kg)  08/03/18 126 lb (57.2  kg)  12/29/17 124 lb 4 oz (56.4 kg)     Cardiac Studies Reviewed: 2D Echo 05-14-2015: Conclusion   Widely patent coronary arteries  Suspect noncardiac chest pain  Indications   Anginal chest pain at rest ([place]) [I20.8 ([id number])]  Complications   Complications documented in old activity   INDICATION: 57 year old woman with chest pain. She has developed frequent chest pain at rest and with exertion. A nuclear scan demonstrated inferoseptal ischemia. Pain persists with addition of isosorbide. Because of a strong family history of CAD, abnormal nuclear scan, and persistent chest pain, she is referred for cardiac catheterization.   PROCEDURAL DETAILS: The right wrist was prepped, draped, and  anesthetized with 1% lidocaine. Using the modified Seldinger technique, a 5/6 French Slender sheath was introduced into the right radial artery. 3 mg of verapamil was administered through the sheath, weight-based unfractionated heparin was administered intravenously. Standard Judkins catheters were used for selective coronary angiography. Catheter exchanges were performed over an exchange length guidewire. There were no immediate procedural complications. A TR band was used for radial hemostasis at the completion of the procedure. The patient was transferred to the post catheterization recovery area for further monitoring.There were no immediate complications during the procedure.      Coronary Findings   Diagnostic  Dominance: Right  Left Anterior Descending  The vessel is angiographically normal.  Left Circumflex  The vessel is angiographically normal.  Right Coronary Artery  The vessel is angiographically normal.  Intervention   No interventions have been documented.  Coronary Diagrams   Diagnostic Diagram        ASSESSMENT AND PLAN: 1.  Mixed hyperlipidemia: Treated with simvastatin.  The patient has been on the 80 mg dose of simvastatin for many years and tolerates this well.  Will  update lipids and LFTs.  We will plan to see her back in 1 year.  2.  Tobacco abuse: We discussed consideration of Chantix and nicotine patches.  She prefers to try nicotine replacement and I have recommended a 7 mg patch.  Current medicines are reviewed with the patient today.  The patient does not have concerns regarding medicines.  Labs/ tests ordered today include:   Orders Placed This Encounter  Procedures  . Comprehensive metabolic panel  . Lipid panel  . EKG 12-Lead    Disposition:   FU one year  Signed, [name], MD  08/20/2018 5:28 PM    [address], [place],   [address] Phone: [phone]; Fax: [phone]

## 2018-08-21 ENCOUNTER — Other Ambulatory Visit: Payer: BLUE CROSS/BLUE SHIELD

## 2018-08-21 DIAGNOSIS — E782 Mixed hyperlipidemia: Secondary | ICD-10-CM

## 2018-08-21 DIAGNOSIS — H2513 Age-related nuclear cataract, bilateral: Secondary | ICD-10-CM | POA: Diagnosis not present

## 2018-08-21 DIAGNOSIS — H25811 Combined forms of age-related cataract, right eye: Secondary | ICD-10-CM | POA: Diagnosis not present

## 2018-08-21 DIAGNOSIS — H2511 Age-related nuclear cataract, right eye: Secondary | ICD-10-CM | POA: Diagnosis not present

## 2018-08-21 LAB — LIPID PANEL
Chol/HDL Ratio: 2.5 ratio (ref 0.0–4.4)
Cholesterol, Total: 169 mg/dL (ref 100–199)
HDL: 67 mg/dL (ref >39)
LDL Calculated: 78 mg/dL (ref 0–99)
Triglycerides: 121 mg/dL (ref 0–149)
VLDL Cholesterol Cal: 24 mg/dL (ref 5–40)

## 2018-08-21 LAB — COMPREHENSIVE METABOLIC PANEL
ALT: 13 IU/L (ref 0–32)
AST: 18 IU/L (ref 0–40)
Albumin/Globulin Ratio: 1.6 (ref 1.2–2.2)
Albumin: 3.9 g/dL (ref 3.5–5.5)
Alkaline Phosphatase: 45 IU/L (ref 39–117)
BUN/Creatinine Ratio: 25 — ABNORMAL HIGH (ref 9–23)
BUN: 17 mg/dL (ref 6–24)
Bilirubin Total: <0.2 mg/dL (ref 0.0–1.2)
CO2: 28 mmol/L (ref 20–29)
Calcium: 9.1 mg/dL (ref 8.7–10.2)
Chloride: 101 mmol/L (ref 96–106)
Creatinine, Ser: 0.69 mg/dL (ref 0.57–1.00)
GFR calc Af Amer: 111 mL/min/{1.73_m2} (ref >59)
GFR calc non Af Amer: 96 mL/min/{1.73_m2} (ref >59)
Globulin, Total: 2.4 g/dL (ref 1.5–4.5)
Glucose: 92 mg/dL (ref 65–99)
Potassium: 4.9 mmol/L (ref 3.5–5.2)
Sodium: 143 mmol/L (ref 134–144)
Total Protein: 6.3 g/dL (ref 6.0–8.5)

## 2018-08-22 ENCOUNTER — Other Ambulatory Visit: Payer: Self-pay | Admitting: Family Medicine

## 2018-08-23 ENCOUNTER — Other Ambulatory Visit: Payer: Self-pay

## 2018-08-23 MED ORDER — SIMVASTATIN 80 MG PO TABS: 80.0000 mg | ORAL_TABLET | Freq: Every day | ORAL | 3 refills | Status: DC

## 2018-08-23 MED ORDER — SIMVASTATIN 80 MG PO TABS: 80.0000 mg | ORAL_TABLET | Freq: Every day | ORAL | 0 refills | Status: DC

## 2018-08-24 MED ORDER — OMEPRAZOLE 20 MG PO CPDR: 20.0000 mg | DELAYED_RELEASE_CAPSULE | Freq: Every day | ORAL | 1 refills | Status: DC

## 2018-08-28 DIAGNOSIS — H2512 Age-related nuclear cataract, left eye: Secondary | ICD-10-CM | POA: Diagnosis not present

## 2018-08-28 DIAGNOSIS — H2513 Age-related nuclear cataract, bilateral: Secondary | ICD-10-CM | POA: Diagnosis not present

## 2018-08-28 DIAGNOSIS — H25812 Combined forms of age-related cataract, left eye: Secondary | ICD-10-CM | POA: Diagnosis not present

## 2018-08-29 DIAGNOSIS — Z01419 Encounter for gynecological examination (general) (routine) without abnormal findings: Secondary | ICD-10-CM | POA: Diagnosis not present

## 2018-08-29 DIAGNOSIS — Z1231 Encounter for screening mammogram for malignant neoplasm of breast: Secondary | ICD-10-CM | POA: Diagnosis not present

## 2018-08-29 DIAGNOSIS — Z6826 Body mass index (BMI) 26.0-26.9, adult: Secondary | ICD-10-CM | POA: Diagnosis not present

## 2018-12-05 ENCOUNTER — Encounter: Payer: Self-pay | Admitting: *Deleted

## 2018-12-05 ENCOUNTER — Ambulatory Visit: Payer: BLUE CROSS/BLUE SHIELD | Admitting: Family Medicine

## 2018-12-05 ENCOUNTER — Encounter: Payer: Self-pay | Admitting: Family Medicine

## 2018-12-05 VITALS — BP 120/76 | HR 79 | Temp 98.3°F | Resp 12 | Ht 62.0 in | Wt 126.4 lb

## 2018-12-05 DIAGNOSIS — J069 Acute upper respiratory infection, unspecified: Secondary | ICD-10-CM

## 2018-12-05 DIAGNOSIS — R0981 Nasal congestion: Secondary | ICD-10-CM

## 2018-12-05 DIAGNOSIS — H6982 Other specified disorders of Eustachian tube, left ear: Secondary | ICD-10-CM

## 2018-12-05 LAB — POCT INFLUENZA A/B
Influenza A, POC: NEGATIVE
Influenza B, POC: NEGATIVE

## 2018-12-05 MED ORDER — PREDNISONE 20 MG PO TABS: 40.0000 mg | ORAL_TABLET | Freq: Every day | ORAL | 0 refills | Status: DC

## 2018-12-05 MED ORDER — FLUTICASONE PROPIONATE 50 MCG/ACT NA SUSP: 1.0000 | Freq: Two times a day (BID) | NASAL | 3 refills | Status: DC

## 2018-12-05 NOTE — Patient Instructions (Addendum)
A few things to remember from today's visit:   URI, acute  viral infections are self-limited and we treat each symptom depending of severity.  Over the counter medications as decongestants and cold medications usually help, they need to be taken with caution if there is a history of high blood pressure or palpitations. Tylenol and/or Ibuprofen also helps with most symptoms (headache, muscle aching, fever,etc) Plenty of fluids. Honey helps with cough. Steam inhalations helps with runny nose, nasal congestion, and may prevent sinus infections. Cough and nasal congestion could last a few days and sometimes weeks. Please follow in not any better in 1-2 weeks or if symptoms get worse.   Nasal saline irrigations several times per day. Take prednisone with breakfast. If not better in 7 days please let me know. Popping your ears a few times per day may help with fullness sensation of left ear.

## 2018-12-05 NOTE — Progress Notes (Signed)
ACUTE VISIT  HPI:  Chief Complaint  Patient presents with  . Left ear ache  . Cough  . Nasal Congestion    with green drainage    Christina Watts is a 57 y.o.female here today complaining of 2-3 days of respiratory symptoms. Nasal congestion. Initially she had non productive "little" cough but today she started with greenish sputum. She denies hemoptysis. She has not noted wheezing,dyspnea,or chest pain.  Also having left earache and irritated throat. Ear fullness sensation.  She has not noted dysphagia or changes in voice.   URI   This is a new problem. The current episode started in the past 7 days. The problem has been unchanged. There has been no fever. Associated symptoms include congestion, coughing, ear pain, a plugged ear sensation, rhinorrhea, a sore throat and swollen glands. Pertinent negatives include no abdominal pain, diarrhea, headaches, nausea, rash, vomiting or wheezing. She has tried acetaminophen and NSAIDs for the symptoms. The treatment provided mild relief.     No Hx of recent travel. No sick contact. No known insect bite.  Hx of allergies: Negative. + Smoker.    Review of Systems  Constitutional: Positive for appetite change and fatigue. Negative for activity change and fever.  HENT: Positive for congestion, ear pain, postnasal drip, rhinorrhea, sinus pressure and sore throat. Negative for mouth sores, trouble swallowing and voice change.   Eyes: Negative for discharge, redness and itching.  Respiratory: Positive for cough. Negative for shortness of breath and wheezing.   Gastrointestinal: Negative for abdominal pain, diarrhea, nausea and vomiting.  Musculoskeletal: Negative for gait problem and myalgias.  Skin: Negative for rash.  Allergic/Immunologic: Negative for environmental allergies.  Neurological: Negative for syncope, weakness and headaches.  Hematological: Negative for adenopathy. Does not bruise/bleed easily.    Psychiatric/Behavioral: Positive for sleep disturbance. Negative for confusion. The patient is nervous/anxious.       Current Outpatient Medications on File Prior to Visit  Medication Sig Dispense Refill  . Acetaminophen-Caffeine (EXCEDRIN TENSION HEADACHE PO) Take 1-2 tablets by mouth as needed. headache    . albuterol (PROVENTIL HFA;VENTOLIN HFA) 108 (90 Base) MCG/ACT inhaler Inhale into the lungs.    Marland Kitchen aspirin 81 MG tablet Take 81 mg by mouth daily.    Marland Kitchen BIOTIN PO Take 1 Dose by mouth daily.     . calcium carbonate (OS-CAL) 600 MG TABS Take 1,200 mg by mouth 2 (two) times daily.     Marland Kitchen estradiol (ESTRACE) 2 MG tablet Take 2 mg by mouth 2 (two) times daily.     Marland Kitchen levothyroxine (SYNTHROID) 100 MCG tablet Take 1 tablet (100 mcg total) by mouth daily before breakfast. 30 tablet 11  . nitroGLYCERIN (NITROSTAT) 0.4 MG SL tablet Place 1 tablet (0.4 mg total) under the tongue every 5 (five) minutes as needed for chest pain. 75 tablet 1  . omeprazole (PRILOSEC) 20 MG capsule Take 1 capsule (20 mg total) by mouth daily. 90 capsule 1  . simvastatin (ZOCOR) 80 MG tablet Take 1 tablet (80 mg total) by mouth at bedtime. 90 tablet 3   No current facility-administered medications on file prior to visit.      Past Medical History:  Diagnosis Date  . 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 This was associated with  Subsequent to that time she has had repeat laparoscopies for chronic, particularly right sided, pelvic pain on two occasions, one in 2000 and again in June of 2003.  At  each time, pelvic adhesions were noted but no other significant pelvic pathology  . Adenomyosis    uterine adenomyosis, in 1995 total abdominal hysterectomy, right ovarian cystectomy  . Cancer ([place])    melanoma - right leg - good now  . GERD (gastroesophageal reflux disease)   . Headache    Migraines  . Hypercholesterolemia   . Hyperlipidemia   . Hypothyroidism   . Other and  unspecified ovarian cysts    long history of recurring ovarian cysts  . Right bundle branch block   . Thyroid disease   . Tobacco abuse   . 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 Ulcer disease    currently asymptomatic and not on medication   Allergies  Allergen Reactions  . Promethazine Hcl Anaphylaxis, Hives and Other (See Comments)    Lungs swell and cant breathe    Social History   Socioeconomic History  . Marital status: Married    Spouse name: Not on file  . Number of children: Not on file  . Years of education: Not on file  . Highest education level: Not on file  Occupational History  . Not on file  Social Needs  . Financial resource strain: Not on file  . Food insecurity:    Worry: Not on file    Inability: Not on file  . Transportation needs:    Medical: Not on file    Non-medical: Not on file  Tobacco Use  . Smoking status: Current Some Day Smoker    Packs/day: 0.50    Years: 19.00    Pack years: 9.50    Types: Cigarettes    Last attempt to quit: 06/05/2017    Years since quitting: 1.5  . Smokeless tobacco: Never Used  Substance and Sexual Activity  . Alcohol use: Yes    Comment: Drinks wine occas  . Drug use: No  . Sexual activity: Yes    Birth control/protection: Surgical  Lifestyle  . Physical activity:    Days per week: Not on file    Minutes per session: Not on file  . Stress: Not on file  Relationships  . Social connections:    Talks on phone: Not on file    Gets together: Not on file    Attends religious service: Not on file    Active member of club or organization: Not on file    Attends meetings of clubs or organizations: Not on file    Relationship status: Not on file  Other Topics Concern  . Not on file  Social History Narrative   She is married with three children, and she has five sibling who are all still living.    Vitals:   12/05/18 1449  BP: 120/76  Pulse: 79  Resp: 12  Temp: 98.3 F (36.8 C)  SpO2: 99%    Body mass index is 23.11 kg/m.   Physical Exam  Nursing note and vitals reviewed. Constitutional: She is oriented to person, place, and time. She appears well-developed and well-nourished. She does not appear ill. No distress.  HENT:  Head: Normocephalic and atraumatic.  Right Ear: Tympanic membrane, external ear and ear canal normal.  Left Ear: External ear and ear canal normal. A middle ear effusion is present.  Nose: Rhinorrhea present. Right sinus exhibits no maxillary sinus tenderness and no frontal sinus tenderness. Left sinus exhibits no maxillary sinus tenderness and no frontal sinus tenderness.  Mouth/Throat: Oropharynx is clear and moist and mucous membranes  are normal.  Normal sinus transillumination. Mild tenderness upon pressing left maxillary sinus. Postnasal drainage.   Eyes: Conjunctivae are normal.  Cardiovascular: Normal rate and regular rhythm.  No murmur heard. Respiratory: Effort normal and breath sounds normal. No respiratory distress.  Lymphadenopathy:       Head (right side): No submandibular adenopathy present.       Head (left side): No submandibular adenopathy present.    She has no cervical adenopathy.  Neurological: She is alert and oriented to person, place, and time. She has normal strength. Gait normal.  Skin: Skin is warm. No rash noted. No erythema.  Psychiatric: Her mood appears anxious.  Well groomed, good eye contact.      ASSESSMENT AND PLAN:  Ms. Briselda was seen today for left ear ache, cough and nasal congestion.  Diagnoses and all orders for this visit:  URI, acute Symptoms suggests a viral etiology, I explained that symptomatic treatment is usually recommended in this case, so I do not think abx is needed at this time. Instructed to monitor for signs of complications, including new onset of fever among some, clearly instructed about warning signs. I also explained that cough and nasal congestion can last a few days and sometimes  weeks. F/U as needed.  -     POC Influenza A/B  Nasal sinus congestion Nasal saline irrigations recommended. Short term of Prednisone may help, side effects discussed. OTC antihistaminic also recommended. Instructed to let me know if fever or worsening symptoms in 5-7 days.  -     fluticasone (FLONASE) 50 MCG/ACT nasal spray; Place 1 spray into both nostrils 2 (two) times daily. -     predniSONE (DELTASONE) 20 MG tablet; Take 2 tablets (40 mg total) by mouth daily with breakfast.  Dysfunction of left eustachian tube OTC decongestant and auto inflation maneuvers may help. Some side effects of decongestants. Instructed about warning signs.      Laylynn Campanella G. Martinique, MD  [facility]. [place] office.

## 2018-12-09 ENCOUNTER — Encounter: Payer: Self-pay | Admitting: Family Medicine

## 2018-12-10 ENCOUNTER — Encounter: Payer: Self-pay | Admitting: *Deleted

## 2018-12-10 ENCOUNTER — Encounter: Payer: Self-pay | Admitting: Family Medicine

## 2018-12-10 ENCOUNTER — Ambulatory Visit (INDEPENDENT_AMBULATORY_CARE_PROVIDER_SITE_OTHER): Payer: BLUE CROSS/BLUE SHIELD | Admitting: Family Medicine

## 2018-12-10 VITALS — BP 140/78 | HR 92 | Temp 98.1°F | Resp 12 | Ht 62.0 in | Wt 126.4 lb

## 2018-12-10 DIAGNOSIS — R05 Cough: Secondary | ICD-10-CM | POA: Diagnosis not present

## 2018-12-10 DIAGNOSIS — J988 Other specified respiratory disorders: Secondary | ICD-10-CM

## 2018-12-10 DIAGNOSIS — R059 Cough, unspecified: Secondary | ICD-10-CM

## 2018-12-10 DIAGNOSIS — Z720 Tobacco use: Secondary | ICD-10-CM | POA: Diagnosis not present

## 2018-12-10 MED ORDER — BENZONATATE 100 MG PO CAPS: 200.0000 mg | ORAL_CAPSULE | Freq: Two times a day (BID) | ORAL | 0 refills | Status: AC | PRN

## 2018-12-10 MED ORDER — HYDROCODONE-HOMATROPINE 5-1.5 MG/5ML PO SYRP: 5.0000 mL | ORAL_SOLUTION | Freq: Two times a day (BID) | ORAL | 0 refills | Status: DC | PRN

## 2018-12-10 MED ORDER — DOXYCYCLINE HYCLATE 100 MG PO TABS: 100.0000 mg | ORAL_TABLET | Freq: Two times a day (BID) | ORAL | 0 refills | Status: AC

## 2018-12-10 NOTE — Progress Notes (Signed)
ACUTE VISIT   HPI:  Chief Complaint  Patient presents with  . Cough  . Chest congestiion    Christina Watts is a 57 y.o. female, who is here today complaining of persistent cough and chest congestion. I saw her on 12/05/2017, at that time she was reassured, recommend symptomatic treatment.  Nasal congestion, rhinorrhea, and sinus pressure have improved with Flonase and nasal saline irrigations.  She states that now congestion is in her chest. Productive cough with greenish sputum. Cough is worse when laying down, interfering with sleep.  No alleviating factors identified. Hx of GERD, she has not noted heartburn,nausea,or vomiting.   She is concerned about developing pneumonia. She has not noted fever, dyspnea, wheezing, or hemoptysis.   Review of Systems  Constitutional: Positive for fatigue. Negative for activity change, appetite change and fever.  HENT: Positive for congestion and postnasal drip. Negative for ear pain, mouth sores, sinus pressure, sore throat, trouble swallowing and voice change.   Respiratory: Positive for cough and chest tightness. Negative for shortness of breath and wheezing.   Cardiovascular: Negative for chest pain and palpitations.  Gastrointestinal: Negative for abdominal pain, diarrhea, nausea and vomiting.  Allergic/Immunologic: Negative for environmental allergies.  Neurological: Negative for weakness and headaches.  Psychiatric/Behavioral: Positive for sleep disturbance. Negative for confusion. The patient is nervous/anxious.       Current Outpatient Medications on File Prior to Visit  Medication Sig Dispense Refill  . Acetaminophen-Caffeine (EXCEDRIN TENSION HEADACHE PO) Take 1-2 tablets by mouth as needed. headache    . albuterol (PROVENTIL HFA;VENTOLIN HFA) 108 (90 Base) MCG/ACT inhaler Inhale into the lungs.    Marland Kitchen aspirin 81 MG tablet Take 81 mg by mouth daily.    Marland Kitchen BIOTIN PO Take 1 Dose by mouth daily.     . calcium  carbonate (OS-CAL) 600 MG TABS Take 1,200 mg by mouth 2 (two) times daily.     Marland Kitchen estradiol (ESTRACE) 2 MG tablet Take 2 mg by mouth 2 (two) times daily.     . fluticasone (FLONASE) 50 MCG/ACT nasal spray Place 1 spray into both nostrils 2 (two) times daily. 16 g 3  . levothyroxine (SYNTHROID) 100 MCG tablet Take 1 tablet (100 mcg total) by mouth daily before breakfast. 30 tablet 11  . nitroGLYCERIN (NITROSTAT) 0.4 MG SL tablet Place 1 tablet (0.4 mg total) under the tongue every 5 (five) minutes as needed for chest pain. 75 tablet 1  . omeprazole (PRILOSEC) 20 MG capsule Take 1 capsule (20 mg total) by mouth daily. 90 capsule 1  . simvastatin (ZOCOR) 80 MG tablet Take 1 tablet (80 mg total) by mouth at bedtime. 90 tablet 3   No current facility-administered medications on file prior to visit.      Past Medical History:  Diagnosis Date  . 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 This was associated with  Subsequent to that time she has had repeat laparoscopies for chronic, particularly right sided, pelvic pain on two occasions, one in 2000 and again in June of 2003.  At each time, pelvic adhesions were noted but no other significant pelvic pathology  . Adenomyosis    uterine adenomyosis, in 1995 total abdominal hysterectomy, right ovarian cystectomy  . Cancer ([place])    melanoma - right leg - good now  . GERD (gastroesophageal reflux disease)   . Headache    Migraines  . Hypercholesterolemia   . Hyperlipidemia   . Hypothyroidism   . Other  and unspecified ovarian cysts    long history of recurring ovarian cysts  . Right bundle branch block   . Thyroid disease   . Tobacco abuse   . 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 Ulcer disease    currently asymptomatic and not on medication   Allergies  Allergen Reactions  . Promethazine Hcl Anaphylaxis, Hives and Other (See Comments)    Lungs swell and cant breathe    Social History    Socioeconomic History  . Marital status: Married    Spouse name: Not on file  . Number of children: Not on file  . Years of education: Not on file  . Highest education level: Not on file  Occupational History  . Not on file  Social Needs  . Financial resource strain: Not on file  . Food insecurity:    Worry: Not on file    Inability: Not on file  . Transportation needs:    Medical: Not on file    Non-medical: Not on file  Tobacco Use  . Smoking status: Current Some Day Smoker    Packs/day: 0.50    Years: 19.00    Pack years: 9.50    Types: Cigarettes    Last attempt to quit: 06/05/2017    Years since quitting: 1.5  . Smokeless tobacco: Never Used  Substance and Sexual Activity  . Alcohol use: Yes    Comment: Drinks wine occas  . Drug use: No  . Sexual activity: Yes    Birth control/protection: Surgical  Lifestyle  . Physical activity:    Days per week: Not on file    Minutes per session: Not on file  . Stress: Not on file  Relationships  . Social connections:    Talks on phone: Not on file    Gets together: Not on file    Attends religious service: Not on file    Active member of club or organization: Not on file    Attends meetings of clubs or organizations: Not on file    Relationship status: Not on file  Other Topics Concern  . Not on file  Social History Narrative   She is married with three children, and she has five sibling who are all still living.    Vitals:   12/10/18 1035  BP: 140/78  Pulse: 92  Resp: 12  Temp: 98.1 F (36.7 C)  SpO2: 99%   Body mass index is 23.11 kg/m.   Physical Exam  Nursing note and vitals reviewed. Constitutional: She is oriented to person, place, and time. She appears well-developed and well-nourished. She does not appear ill. No distress.  HENT:  Head: Normocephalic and atraumatic.  Nose: Rhinorrhea present.  Mouth/Throat: Oropharynx is clear and moist and mucous membranes are normal.  Post nasal drainage.   Eyes: Conjunctivae are normal.  Cardiovascular: Normal rate and regular rhythm.  No murmur heard. Respiratory: Effort normal and breath sounds normal. No respiratory distress.  Lymphadenopathy:       Head (right side): No submandibular adenopathy present.       Head (left side): No submandibular adenopathy present.    She has no cervical adenopathy.  Neurological: She is alert and oriented to person, place, and time. She has normal strength. Gait normal.  Skin: Skin is warm. No rash noted. No erythema.  Psychiatric: Her mood appears anxious.  Well groomed, good eye contact.    ASSESSMENT AND PLAN:  Christina Watts was seen today for  cough and chest congestiion.  Diagnoses and all orders for this visit:  Respiratory tract infection I still think it is viral. Lung auscultation is negative. Recommend holding on abx for a few days and start if she is not feeling any better in 3-4 days. Side effects of abx discussed. F/U as needed.  -     doxycycline (VIBRA-TABS) 100 MG tablet; Take 1 tablet (100 mg total) by mouth 2 (two) times daily for 7 days.  Cough Explained that cough and congestion can last a few weeks. Adequate hydration and honey may help. Side effects of Hydrocodone discussed.  -     HYDROcodone-homatropine (HYCODAN) 5-1.5 MG/5ML syrup; Take 5 mLs by mouth every 12 (twelve) hours as needed for cough. -     benzonatate (TESSALON) 100 MG capsule; Take 2 capsules (200 mg total) by mouth 2 (two) times daily as needed for up to 10 days.  Tobacco abuse disorder  Because of smoking, respiratory symptoms may last long. Strongly recommend smoking cessation. Adverse effects discussed as well as benefits.     Return if symptoms worsen or fail to improve.      Jotham Ahn G. Martinique, MD  [facility]. [place] office.

## 2018-12-10 NOTE — Patient Instructions (Signed)
A few things to remember from today's visit:   Respiratory tract infection - Plan: doxycycline (VIBRA-TABS) 100 MG tablet  Cough - Plan: HYDROcodone-homatropine (HYCODAN) 5-1.5 MG/5ML syrup, benzonatate (TESSALON) 100 MG capsule  I still think antibiotic is not necessary at this time, it seems like symptoms are getting better. Recommend holding on doxycycline for 3 to 4 days and if you develop fever or you feel like you are getting worse you can start taking it.   Please be sure medication list is accurate. If a new problem present, please set up appointment sooner than planned today.

## 2018-12-24 ENCOUNTER — Telehealth: Payer: Self-pay | Admitting: *Deleted

## 2018-12-24 NOTE — Telephone Encounter (Signed)
Prior auth for Fluticasone propionate 71mcg sent to [email or web address] [id number].

## 2018-12-28 NOTE — Telephone Encounter (Signed)
Noted. FYI sent to Dr. Jordan. 

## 2018-12-28 NOTE — Telephone Encounter (Signed)
Fax received from [facility] stating the request was denied and this was given to Dr Doug Sou asst.

## 2018-12-31 ENCOUNTER — Encounter: Payer: BLUE CROSS/BLUE SHIELD | Admitting: Family Medicine

## 2019-01-02 ENCOUNTER — Other Ambulatory Visit: Payer: Self-pay | Admitting: *Deleted

## 2019-01-02 ENCOUNTER — Ambulatory Visit (INDEPENDENT_AMBULATORY_CARE_PROVIDER_SITE_OTHER): Payer: BLUE CROSS/BLUE SHIELD | Admitting: Family Medicine

## 2019-01-02 ENCOUNTER — Encounter: Payer: Self-pay | Admitting: Family Medicine

## 2019-01-02 VITALS — BP 120/76 | HR 75 | Temp 98.1°F | Resp 12 | Ht 62.0 in | Wt 122.5 lb

## 2019-01-02 DIAGNOSIS — E782 Mixed hyperlipidemia: Secondary | ICD-10-CM | POA: Diagnosis not present

## 2019-01-02 DIAGNOSIS — Z0000 Encounter for general adult medical examination without abnormal findings: Secondary | ICD-10-CM | POA: Diagnosis not present

## 2019-01-02 DIAGNOSIS — E209 Hypoparathyroidism, unspecified: Secondary | ICD-10-CM

## 2019-01-02 DIAGNOSIS — E890 Postprocedural hypothyroidism: Secondary | ICD-10-CM

## 2019-01-02 DIAGNOSIS — Z23 Encounter for immunization: Secondary | ICD-10-CM | POA: Diagnosis not present

## 2019-01-02 LAB — TSH: TSH: 1.46 u[IU]/mL (ref 0.35–4.50)

## 2019-01-02 LAB — VITAMIN D 25 HYDROXY (VIT D DEFICIENCY, FRACTURES): VITD: 53.94 ng/mL (ref 30.00–100.00)

## 2019-01-02 MED ORDER — OMEPRAZOLE 20 MG PO CPDR: 20.0000 mg | DELAYED_RELEASE_CAPSULE | Freq: Every day | ORAL | 1 refills | Status: DC

## 2019-01-02 MED ORDER — LEVOTHYROXINE SODIUM 100 MCG PO TABS: 100.0000 ug | ORAL_TABLET | Freq: Every day | ORAL | 11 refills | Status: DC

## 2019-01-02 NOTE — Progress Notes (Signed)
HPI:   Christina Watts is a 58 y.o. female, who is here today for her routine physical.  Last CPE: 12/29/17  Regular exercise 3 or more time per week: Not regularly but she is active at work. Following a healthy diet: Yes. She lives with her husband.  Chronic medical problems: Hyperlipidemia, RBBB, GERD, migraine, tobacco use disorder, hyperparathyroidism, and hypothyroidism. She follows with cardiologist regularly, Dr. Burt Knack.  She follows with gynecologist, Dr. Fabiola Backer. Last OV 07/2018.   Immunization History  Administered Date(s) Administered  . H1N1 11/28/2008  . Influenza, Seasonal, Injecte, Preservative Fre 10/28/2015  . Influenza,inj,Quad PF,6+ Mos 10/25/2012, 12/28/2016, 01/02/2019  . Influenza,trivalent, recombinat, inj, PF 01/23/2015  . Pneumococcal Polysaccharide-23 01/02/2019  . Tdap 07/01/2006, 12/26/2013, 01/08/2016   S/P hysterectomy. Mammogram: 07/2018 at care gynecologist's office Colonoscopy: 2016 by Dr Earlean Shawl. + Tobacco use.  Hep C screening: 12/2016 NR  She has no concerns today.  Hyperparathyroidism, currently she is on calcium 600 mg twice daily.  She is not on vitamin D supplementation.  HLD on non pharmacologic treatment. Lab Results  Component Value Date   CHOL 169 08/21/2018   HDL 67 08/21/2018   LDLCALC 78 08/21/2018   TRIG 121 08/21/2018   CHOLHDL 2.5 08/21/2018    Hypothyroidism: Status post thyroidectomy. Currently she is on levothyroxine 100 mcg daily..  She has not noted dysphagia, palpitations, abdominal pain, changes in bowel habits, tremor, cold/heat intolerance, or abnormal weight loss.  Lab Results  Component Value Date   TSH 1.71 12/29/2017    Review of Systems  Constitutional: Negative for appetite change, fatigue and fever.  HENT: Negative for dental problem, hearing loss, mouth sores, sore throat, trouble swallowing and voice change.   Eyes: Negative for redness and visual disturbance.  Respiratory: Negative for  cough, shortness of breath and wheezing.   Cardiovascular: Negative for chest pain and leg swelling.  Gastrointestinal: Negative for abdominal pain, nausea and vomiting.       No changes in bowel habits.  Endocrine: Negative for cold intolerance, heat intolerance, polydipsia, polyphagia and polyuria.  Genitourinary: Negative for decreased urine volume, dysuria, hematuria, vaginal bleeding and vaginal discharge.  Musculoskeletal: Negative for gait problem and myalgias.  Skin: Negative for color change and rash.  Allergic/Immunologic: Positive for environmental allergies.  Neurological: Negative for syncope, weakness and headaches.  Hematological: Negative for adenopathy. Does not bruise/bleed easily.  Psychiatric/Behavioral: Negative for confusion and sleep disturbance. The patient is not nervous/anxious.   All other systems reviewed and are negative.     Current Outpatient Medications on File Prior to Visit  Medication Sig Dispense Refill  . Acetaminophen-Caffeine (EXCEDRIN TENSION HEADACHE PO) Take 1-2 tablets by mouth as needed. headache    . albuterol (PROVENTIL HFA;VENTOLIN HFA) 108 (90 Base) MCG/ACT inhaler Inhale into the lungs.    Marland Kitchen aspirin 81 MG tablet Take 81 mg by mouth daily.    Marland Kitchen BIOTIN PO Take 1 Dose by mouth daily.     . calcium carbonate (OS-CAL) 600 MG TABS Take 1,200 mg by mouth 2 (two) times daily.     Marland Kitchen estradiol (ESTRACE) 2 MG tablet Take 2 mg by mouth 2 (two) times daily.     . fluticasone (FLONASE) 50 MCG/ACT nasal spray Place 1 spray into both nostrils 2 (two) times daily. 16 g 3  . HYDROcodone-homatropine (HYCODAN) 5-1.5 MG/5ML syrup Take 5 mLs by mouth every 12 (twelve) hours as needed for cough. 100 mL 0  . nitroGLYCERIN (NITROSTAT) 0.4 MG SL  tablet Place 1 tablet (0.4 mg total) under the tongue every 5 (five) minutes as needed for chest pain. 75 tablet 1  . simvastatin (ZOCOR) 80 MG tablet Take 1 tablet (80 mg total) by mouth at bedtime. 90 tablet 3   No  current facility-administered medications on file prior to visit.      Past Medical History:  Diagnosis Date  . 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 This was associated with  Subsequent to that time she has had repeat laparoscopies for chronic, particularly right sided, pelvic pain on two occasions, one in 2000 and again in June of 2003.  At each time, pelvic adhesions were noted but no other significant pelvic pathology  . Adenomyosis    uterine adenomyosis, in 1995 total abdominal hysterectomy, right ovarian cystectomy  . Cancer ([place])    melanoma - right leg - good now  . GERD (gastroesophageal reflux disease)   . Headache    Migraines  . Hypercholesterolemia   . Hyperlipidemia   . Hypothyroidism   . Other and unspecified ovarian cysts    long history of recurring ovarian cysts  . Right bundle branch block   . Thyroid disease   . Tobacco abuse   . 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 Ulcer disease    currently asymptomatic and not on medication    Past Surgical History:  Procedure Laterality Date  . ABDOMINAL HYSTERECTOMY    . APPENDECTOMY     for obliteration of the appendiceal tip  . BREAST BIOPSY  02/1992   biopsy of benign breast mass  . CARDIAC CATHETERIZATION N/A 05/14/2015   Procedure: Left Heart Cath and Coronary Angiography;  Surgeon: [name], MD;  Location: [facility];  Service: Cardiovascular;  Laterality: N/A;  . ESOPHAGOGASTRODUODENOSCOPY  12/06/2005   Normal esophagogastroduodenoscopy --  Earle Gell, M.D.  . HARDWARE REMOVAL Left 04/19/2017   Procedure: HARDWARE REMOVAL LEFT ANKLE;  Surgeon: Earlie Server, MD;  Location: [facility];  Service: Orthopedics;  Laterality: Left;  . left ankle surgery    . OVARIAN CYST REMOVAL  1995  . TOTAL ABDOMINAL HYSTERECTOMY W/ BILATERAL SALPINGOOPHORECTOMY  1995  . TOTAL THYROIDECTOMY  05/13/2004   Multiple thyroid nodules, dominant  mass, left thyroid lobe -- by, Earnstine Regal, M.D  . TUBAL LIGATION  1989    Allergies  Allergen Reactions  . Promethazine Hcl Anaphylaxis, Hives and Other (See Comments)    Lungs swell and cant breathe    Family History  Problem Relation Age of Onset  . Heart disease Father   . Heart attack Father   . Asthma Mother   . Diabetes Mother   . Heart disease Other        fathers side has a strong history of heart disease  . Hyperlipidemia Other        strong family history of    Social History   Socioeconomic History  . Marital status: Married    Spouse name: Not on file  . Number of children: Not on file  . Years of education: Not on file  . Highest education level: Not on file  Occupational History  . Not on file  Social Needs  . Financial resource strain: Not on file  . Food insecurity:    Worry: Not on file    Inability: Not on file  . Transportation needs:    Medical: Not on file    Non-medical:  Not on file  Tobacco Use  . Smoking status: Current Some Day Smoker    Packs/day: 0.50    Years: 19.00    Pack years: 9.50    Types: Cigarettes    Last attempt to quit: 06/05/2017    Years since quitting: 1.5  . Smokeless tobacco: Never Used  Substance and Sexual Activity  . Alcohol use: Yes    Comment: Drinks wine occas  . Drug use: No  . Sexual activity: Yes    Birth control/protection: Surgical  Lifestyle  . Physical activity:    Days per week: Not on file    Minutes per session: Not on file  . Stress: Not on file  Relationships  . Social connections:    Talks on phone: Not on file    Gets together: Not on file    Attends religious service: Not on file    Active member of club or organization: Not on file    Attends meetings of clubs or organizations: Not on file    Relationship status: Not on file  Other Topics Concern  . Not on file  Social History Narrative   She is married with three children, and she has five sibling who are all still living.      Vitals:   01/02/19 0730  BP: 120/76  Pulse: 75  Resp: 12  Temp: 98.1 F (36.7 C)  SpO2: 100%   Body mass index is 22.41 kg/m.   Wt Readings from Last 3 Encounters:  01/03/19 122 lb (55.3 kg)  01/02/19 122 lb 8 oz (55.6 kg)  12/10/18 126 lb 6 oz (57.3 kg)    Physical Exam  Nursing note and vitals reviewed. Constitutional: She is oriented to person, place, and time. She appears well-developed and well-nourished. No distress.  HENT:  Head: Normocephalic and atraumatic.  Right Ear: Hearing, tympanic membrane, external ear and ear canal normal.  Left Ear: Hearing, tympanic membrane, external ear and ear canal normal.  Mouth/Throat: Uvula is midline, oropharynx is clear and moist and mucous membranes are normal.  Eyes: Pupils are equal, round, and reactive to light. Conjunctivae and EOM are normal.  Neck: No tracheal deviation present. No thyromegaly present.  Cardiovascular: Normal rate and regular rhythm.  No murmur heard. Pulses:      Dorsalis pedis pulses are 2+ on the right side and 2+ on the left side.  Respiratory: Effort normal and breath sounds normal. No respiratory distress.  GI: Soft. She exhibits no mass. There is no hepatomegaly. There is no abdominal tenderness.  Genitourinary:    Genitourinary Comments: Deferred to gyn.   Musculoskeletal:        General: No edema.     Comments: No major deformity or signs of synovitis appreciated.  Lymphadenopathy:    She has no cervical adenopathy.       Right: No supraclavicular adenopathy present.       Left: No supraclavicular adenopathy present.  Neurological: She is alert and oriented to person, place, and time. She has normal strength. No cranial nerve deficit. Coordination and gait normal.  Reflex Scores:      Bicep reflexes are 2+ on the right side and 2+ on the left side.      Patellar reflexes are 2+ on the right side and 2+ on the left side. Skin: Skin is warm. No rash noted. No erythema.  Psychiatric:  She has a normal mood and affect. Cognition and memory are normal.  Well groomed, good eye contact.  ASSESSMENT AND PLAN:  Ms. Jennilyn Esteve was here today annual physical examination.   Orders Placed This Encounter  Procedures  . Flu Vaccine QUAD 36+ mos IM  . Pneumococcal polysaccharide vaccine 23-valent greater than or equal to 2yo subcutaneous/IM  . VITAMIN D 25 Hydroxy (Vit-D Deficiency, Fractures)  . TSH  . PTH, intact and calcium   Lab Results  Component Value Date   TSH 1.46 01/02/2019    Routine general medical examination at a health care facility We discussed the importance of regular physical activity and healthy diet for prevention of chronic illness and/or complications. Preventive guidelines reviewed. Vaccination updated. Continue following with Dr Nail for her routine gyn preventive care. Ca++ and vit D supplementation to continue. Next CPE in a year.  The 10-year ASCVD risk score Mikey Bussing DC Brooke Bonito., et al., 2013) is: 6.4%   Values used to calculate the score:     Age: 24 years     Sex: Female     Is Non-Hispanic African American: No     Diabetic: No     Tobacco smoker: Yes     Systolic Blood Pressure: 967 mmHg     Is BP treated: No     HDL Cholesterol: 67 mg/dL     Total Cholesterol: 169 mg/dL  Mixed hyperlipidemia Continue non pharmacologic treatment for now. Smoking cessation encouraged.  Hypothyroidism, postsurgical No changes in current management, will follow labs done today and will give further recommendations accordingly.  Hypoparathyroidism, unspecified hypoparathyroidism type ([place]) Continue Ca++ 600 mg bid. Further recommendations will be given according to labs results.       -     VITAMIN D 25 Hydroxy (Vit-D Deficiency, Fractures) -     TSH -     Cancel: PTH, intact and calcium -     PTH, intact and calcium  Need for immunization against influenza -     Flu Vaccine QUAD 36+ mos IM  Need for pneumococcal vaccination -      Pneumococcal polysaccharide vaccine 23-valent greater than or equal to 2yo subcutaneous/IM      Return in 1 year (on 01/03/2020) for cpe.       G. Martinique, MD  [facility]. [place] office.

## 2019-01-02 NOTE — Patient Instructions (Addendum)
A few things to remember from today's visit:   Routine general medical examination at a health care facility  Mixed hyperlipidemia  Hypothyroidism, postsurgical  Hypoparathyroidism, unspecified hypoparathyroidism type ([place]) - Plan: PTH, intact and calcium, VITAMIN D 25 Hydroxy (Vit-D Deficiency, Fractures), TSH  Hypocalcemia  Today you have you routine preventive visit.  At least 150 minutes of moderate exercise per week, daily brisk walking for 15-30 min is a good exercise option. Healthy diet low in saturated (animal) fats and sweets and consisting of fresh fruits and vegetables, lean meats such as fish and white chicken and whole grains.  These are some of recommendations for screening depending of age and risk factors:   - Vaccines:  Tdap vaccine every 10 years.  Shingles vaccine recommended at age 50, could be given after 59 years of age but not sure about insurance coverage.   Pneumonia vaccines:  Prevnar 13 at 65 and Pneumovax at 105. Sometimes Pneumovax is giving earlier if history of smoking, lung disease,diabetes,kidney disease among some.    Screening for diabetes at age 29 and every 3 years.  Cervical cancer prevention:  Pap smear starts at 59 years of age and continues periodically until 59 years old in low risk women. Pap smear every 3 years between 12 and 38 years old. Pap smear every 3-5 years between women 31 and older if pap smear negative and HPV screening negative.   -Breast cancer: Mammogram: There is disagreement between experts about when to start screening in low risk asymptomatic female but recent recommendations are to start screening at 60 and not later than 59 years old , every 1-2 years and after 59 yo q 2 years. Screening is recommended until 59 years old but some women can continue screening depending of healthy issues.   Colon cancer screening: starts at 59 years old until 59 years old.   Also recommended:  1. Dental visit- Brush and floss  your teeth twice daily; visit your dentist twice a year. 2. Eye doctor- Get an eye exam at least every 2 years. 3. Helmet use- Always wear a helmet when riding a bicycle, motorcycle, rollerblading or skateboarding. 4. Safe sex- If you may be exposed to sexually transmitted infections, use a condom. 5. Seat belts- Seat belts can save your live; always wear one. 6. Smoke/Carbon Monoxide detectors- These detectors need to be installed on the appropriate level of your home. Replace batteries at least once a year. 7. Skin cancer- When out in the sun please cover up and use sunscreen 15 SPF or higher. 8. Violence- If anyone is threatening or hurting you, please tell your healthcare provider.  9. Drink alcohol in moderation- Limit alcohol intake to one drink or less per day. Never drink and drive.  Please be sure medication list is accurate. If a new problem present, please set up appointment sooner than planned today.

## 2019-01-03 ENCOUNTER — Encounter: Payer: Self-pay | Admitting: Family Medicine

## 2019-01-03 ENCOUNTER — Other Ambulatory Visit: Payer: Self-pay

## 2019-01-03 ENCOUNTER — Encounter (HOSPITAL_BASED_OUTPATIENT_CLINIC_OR_DEPARTMENT_OTHER): Payer: Self-pay | Admitting: *Deleted

## 2019-01-03 ENCOUNTER — Emergency Department (HOSPITAL_BASED_OUTPATIENT_CLINIC_OR_DEPARTMENT_OTHER)
Admission: EM | Admit: 2019-01-03 | Discharge: 2019-01-03 | Disposition: A | Discharge status: Home / Self Care | Payer: BLUE CROSS/BLUE SHIELD | Attending: Emergency Medicine | Admitting: Emergency Medicine

## 2019-01-03 DIAGNOSIS — Z79899 Other long term (current) drug therapy: Secondary | ICD-10-CM | POA: Insufficient documentation

## 2019-01-03 DIAGNOSIS — N3001 Acute cystitis with hematuria: Secondary | ICD-10-CM | POA: Diagnosis not present

## 2019-01-03 DIAGNOSIS — F17210 Nicotine dependence, cigarettes, uncomplicated: Secondary | ICD-10-CM | POA: Insufficient documentation

## 2019-01-03 DIAGNOSIS — Z7982 Long term (current) use of aspirin: Secondary | ICD-10-CM | POA: Diagnosis not present

## 2019-01-03 DIAGNOSIS — R300 Dysuria: Secondary | ICD-10-CM | POA: Diagnosis not present

## 2019-01-03 DIAGNOSIS — E039 Hypothyroidism, unspecified: Secondary | ICD-10-CM | POA: Insufficient documentation

## 2019-01-03 LAB — URINALYSIS, ROUTINE W REFLEX MICROSCOPIC
Bilirubin Urine: NEGATIVE
Glucose, UA: NEGATIVE mg/dL
Ketones, ur: NEGATIVE mg/dL
Nitrite: POSITIVE — AB
Protein, ur: NEGATIVE mg/dL
Specific Gravity, Urine: <1.005 — ABNORMAL LOW (ref 1.005–1.030)
pH: 7 (ref 5.0–8.0)

## 2019-01-03 LAB — URINALYSIS, MICROSCOPIC (REFLEX): WBC, UA: >50 WBC/hpf (ref 0–5)

## 2019-01-03 LAB — PTH, INTACT AND CALCIUM
Calcium: 8.5 mg/dL — ABNORMAL LOW (ref 8.6–10.4)
PTH: 18 pg/mL (ref 14–64)

## 2019-01-03 MED ORDER — SULFAMETHOXAZOLE-TRIMETHOPRIM 800-160 MG PO TABS
1.0000 | ORAL_TABLET | Freq: Once | ORAL | Status: AC
  Administered 2019-01-03: 1 via ORAL
  Filled 2019-01-03: qty 1

## 2019-01-03 MED ORDER — SULFAMETHOXAZOLE-TRIMETHOPRIM 800-160 MG PO TABS: 1.0000 | ORAL_TABLET | Freq: Two times a day (BID) | ORAL | 0 refills | Status: AC

## 2019-01-03 MED FILL — SULFAMETHOXAZOLE-TMP DS TAB: 800-160 | 7 days supply | Qty: 13 | Fill #0

## 2019-01-03 NOTE — ED Notes (Signed)
NAD at this time. Pt is stable and going home.  

## 2019-01-03 NOTE — ED Triage Notes (Signed)
Woke with pressure with urination. Her urine has red specks in it per pt.

## 2019-01-03 NOTE — ED Provider Notes (Signed)
[facility] EMERGENCY DEPARTMENT Provider Note   CSN: [id number] Arrival date & time: 01/03/19  1515     History   Chief Complaint Chief Complaint  Patient presents with  . Dysuria    HPI Christina Watts is a 58 y.o. female.  The history is provided by the patient. No language interpreter was used.  Dysuria  Quality: pressure. Pain severity:  Moderate Onset quality:  Sudden Duration:  2 days Timing:  Intermittent Progression:  Unchanged Chronicity:  New Recent urinary tract infections: no   Urinary symptoms: frequent urination and hematuria   Associated symptoms: no vaginal discharge   Associated symptoms comment:  Low pelvic pressure Risk factors: no hx of pyelonephritis, no hx of urolithiasis, no recurrent urinary tract infections and no renal disease     Past Medical History:  Diagnosis Date  . 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 This was associated with  Subsequent to that time she has had repeat laparoscopies for chronic, particularly right sided, pelvic pain on two occasions, one in 2000 and again in June of 2003.  At each time, pelvic adhesions were noted but no other significant pelvic pathology  . Adenomyosis    uterine adenomyosis, in 1995 total abdominal hysterectomy, right ovarian cystectomy  . Cancer ([place])    melanoma - right leg - good now  . GERD (gastroesophageal reflux disease)   . Headache    Migraines  . Hypercholesterolemia   . Hyperlipidemia   . Hypothyroidism   . Other and unspecified ovarian cysts    long history of recurring ovarian cysts  . Right bundle branch block   . Thyroid disease   . Tobacco abuse   . 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 Ulcer disease    currently asymptomatic and not on medication    Patient Active Problem List   Diagnosis Date Noted  . 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 GERD (gastroesophageal reflux disease) 06/12/2017  .  Hypothyroidism, postsurgical 07/25/2016  . Hypoparathyroidism ([place]) 07/25/2016  . Tobacco abuse disorder 07/25/2016  . Hypocalcemia 07/25/2016  . Chest tightness or pressure 06/06/2011  . Hyperlipemia 06/06/2011    Past Surgical History:  Procedure Laterality Date  . ABDOMINAL HYSTERECTOMY    . APPENDECTOMY     for obliteration of the appendiceal tip  . BREAST BIOPSY  02/1992   biopsy of benign breast mass  . CARDIAC CATHETERIZATION N/A 05/14/2015   Procedure: Left Heart Cath and Coronary Angiography;  Surgeon: [name], MD;  Location: [facility];  Service: Cardiovascular;  Laterality: N/A;  . ESOPHAGOGASTRODUODENOSCOPY  12/06/2005   Normal esophagogastroduodenoscopy --  Earle Gell, M.D.  . HARDWARE REMOVAL Left 04/19/2017   Procedure: HARDWARE REMOVAL LEFT ANKLE;  Surgeon: Earlie Server, MD;  Location: [facility];  Service: Orthopedics;  Laterality: Left;  . left ankle surgery    . OVARIAN CYST REMOVAL  1995  . TOTAL ABDOMINAL HYSTERECTOMY W/ BILATERAL SALPINGOOPHORECTOMY  1995  . TOTAL THYROIDECTOMY  05/13/2004   Multiple thyroid nodules, dominant mass, left thyroid lobe -- by, Earnstine Regal, M.D  . TUBAL LIGATION  1989     OB History   No obstetric history on file.      Home Medications    Prior to Admission medications   Medication Sig Start Date End Date Taking? Authorizing Provider  Acetaminophen-Caffeine (EXCEDRIN TENSION HEADACHE PO) Take 1-2 tablets by mouth as needed. headache  [provider]  albuterol (PROVENTIL HFA;VENTOLIN HFA) 108 (90 Base) MCG/ACT inhaler Inhale into the lungs. 09/18/14   [provider]  aspirin 81 MG tablet Take 81 mg by mouth daily.    [provider]  BIOTIN PO Take 1 Dose by mouth daily.     [provider]  calcium carbonate (OS-CAL) 600 MG TABS Take 1,200 mg by mouth 2 (two) times daily.     [provider]  estradiol (ESTRACE) 2 MG tablet Take 2 mg by  mouth 2 (two) times daily.  05/12/11   [provider]  fluticasone (FLONASE) 50 MCG/ACT nasal spray Place 1 spray into both nostrils 2 (two) times daily. 12/05/18   Martinique, Betty G, MD  HYDROcodone-homatropine [facility]) 5-1.5 MG/5ML syrup Take 5 mLs by mouth every 12 (twelve) hours as needed for cough. 12/10/18   [place], Betty G, MD  levothyroxine (SYNTHROID) 100 MCG tablet Take 1 tablet (100 mcg total) by mouth daily before breakfast. 01/02/19   Martin[place], Betty G, MD  nitroGLYCERIN (NITROSTAT) 0.4 MG SL tablet Place 1 tablet (0.4 mg total) under the tongue every 5 (five) minutes as needed for chest pain. 01/15/18   [name], MD  omeprazole (PRILOSEC) 20 MG capsule Take 1 capsule (20 mg total) by mouth daily. 01/02/19   Martin[place], Betty G, MD  simvastatin (ZOCOR) 80 MG tablet Take 1 tablet (80 mg total) by mouth at bedtime. 08/23/18   [name], MD    Family History Family History  Problem Relation Age of Onset  . Heart disease Father   . Heart attack Father   . Asthma Mother   . Diabetes Mother   . Heart disease Other        fathers side has a strong history of heart disease  . Hyperlipidemia Other        strong family history of    Social History Social History   Tobacco Use  . Smoking status: Current Some Day Smoker    Packs/day: 0.50    Years: 19.00    Pack years: 9.50    Types: Cigarettes    Last attempt to quit: 06/05/2017    Years since quitting: 1.5  . Smokeless tobacco: Never Used  Substance Use Topics  . Alcohol use: Yes    Comment: Drinks wine occas  . Drug use: No     Allergies   Promethazine hcl   Review of Systems Review of Systems  Genitourinary: Positive for dysuria, hematuria and urgency. Negative for vaginal discharge.  Musculoskeletal: Negative for back pain.  All other systems reviewed and are negative.    Physical Exam Updated Vital Signs BP (!) 156/78 (BP Location: Right Arm)   Pulse 79   Temp 98.2 F (36.8 C) (Oral)   Resp 16    Ht 5\' 2"  (1.575 m)   Wt 55.3 kg   SpO2 99%   BMI 22.31 kg/m   Physical Exam Vitals signs and nursing note reviewed.  Constitutional:      Appearance: Normal appearance. She is not ill-appearing.  HENT:     Head: Normocephalic.  Eyes:     Conjunctiva/sclera: Conjunctivae normal.  Cardiovascular:     Rate and Rhythm: Normal rate and regular rhythm.  Pulmonary:     Effort: Pulmonary effort is normal.     Breath sounds: Normal breath sounds.  Abdominal:     Palpations: Abdomen is soft.     Tenderness: There is no right CVA tenderness or left CVA tenderness.  Musculoskeletal: Normal range of motion.  Skin:    General: Skin is warm and dry.  Neurological:     Mental Status: She is alert and oriented to person, place, and time.  Psychiatric:        Mood and Affect: Mood normal.        Behavior: Behavior normal.      ED Treatments / Results  Labs (all labs ordered are listed, but only abnormal results are displayed) Labs Reviewed  URINALYSIS, ROUTINE W REFLEX MICROSCOPIC - Abnormal; Notable for the following components:      Result Value   APPearance CLOUDY (*)    Specific Gravity, Urine <1.005 (*)    Hgb urine dipstick LARGE (*)    Nitrite POSITIVE (*)    Leukocytes, UA LARGE (*)    All other components within normal limits  URINALYSIS, MICROSCOPIC (REFLEX) - Abnormal; Notable for the following components:   Bacteria, UA MANY (*)    All other components within normal limits  URINE CULTURE    EKG None  Radiology No results found.  Procedures Procedures (including critical care time)  Medications Ordered in ED Medications  sulfamethoxazole-trimethoprim (BACTRIM DS,SEPTRA DS) 800-160 MG per tablet 1 tablet (1 tablet Oral Given 01/03/19 1646)     Initial Impression / Assessment and Plan / ED Course  I have reviewed the triage vital signs and the nursing notes.  Pertinent labs & imaging results that were available during my care of the patient were reviewed  by me and considered in my medical decision making (see chart for details).     Pt diagnosed with a UTI. Pt is afebrile, without tachycardia, hypotension, or other signs of serious infection.  Pt to be dc home with antibiotics and instructions to follow up with PCP if symptoms persist. Discussed return precautions. Pt appears safe for discharge.  Final Clinical Impressions(s) / ED Diagnoses   Final diagnoses:  Acute cystitis with hematuria    ED Discharge Orders         Ordered    sulfamethoxazole-trimethoprim (BACTRIM DS,SEPTRA DS) 800-160 MG tablet  2 times daily     01/03/19 1631           Etta Quill, NP 01/03/19 1658    Davonna Belling, MD 01/04/19 [phone]

## 2019-01-04 ENCOUNTER — Encounter: Payer: BLUE CROSS/BLUE SHIELD | Admitting: Family Medicine

## 2019-01-05 LAB — URINE CULTURE
Culture: >=100000 — AB
Special Requests: NORMAL

## 2019-01-06 ENCOUNTER — Telehealth: Payer: Self-pay | Admitting: Emergency Medicine

## 2019-01-06 NOTE — Telephone Encounter (Signed)
Post ED Visit - Positive Culture Follow-up  Culture report reviewed by antimicrobial stewardship pharmacist:  []  Elenor Quinones, Pharm.D. []  Heide Guile, Pharm.D., BCPS AQ-ID []  Parks Neptune, Pharm.D., BCPS []  Alycia Rossetti, Pharm.D., BCPS []  [place], Pharm.D., BCPS, AAHIVP []  Legrand Como, Pharm.D., BCPS, AAHIVP [x]  Salome Arnt, PharmD, BCPS []  Johnnette Gourd, PharmD, BCPS []  Hughes Better, PharmD, BCPS []  Leeroy Cha, PharmD  Positive urine culture Treated with Sulfamethoxazole-trimethroprim, organism sensitive to the same and no further patient follow-up is required at this time.  Larene Beach Rorey Bisson 01/06/2019, 10:42 AM

## 2019-01-11 ENCOUNTER — Telehealth: Payer: Self-pay | Admitting: Family Medicine

## 2019-01-11 NOTE — Telephone Encounter (Signed)
Copied from [place] [phone]. Topic: Quick Communication - See Telephone Encounter >> Jan 11, 2019  3:40 PM Percell Belt A wrote: CRM for notification. See Telephone encounter for: 01/11/19.  Pt called in and stated that the levothyroxine (SYNTHROID) 100 MCG tablet [[id number]] was called in but she can not do the genertic, she has to have the name brand only.  She has tried this one and it does not work.  She would like the synthroid called in  30 day supply only not 90 days Pharmacy - [facility] [place]

## 2019-01-14 ENCOUNTER — Other Ambulatory Visit: Payer: Self-pay | Admitting: *Deleted

## 2019-01-14 MED ORDER — LEVOTHYROXINE SODIUM 100 MCG PO TABS: 100.0000 ug | ORAL_TABLET | Freq: Every day | ORAL | 4 refills | Status: DC

## 2019-01-14 NOTE — Telephone Encounter (Signed)
Brand name sent to pharmacy as requested.

## 2019-01-16 ENCOUNTER — Other Ambulatory Visit: Payer: Self-pay | Admitting: Family Medicine

## 2019-01-16 DIAGNOSIS — N390 Urinary tract infection, site not specified: Secondary | ICD-10-CM | POA: Diagnosis not present

## 2019-03-18 ENCOUNTER — Telehealth: Payer: Self-pay | Admitting: Cardiovascular Disease

## 2019-03-18 NOTE — Telephone Encounter (Signed)
° ° °  Pt c/o BP issue: STAT if pt c/o blurred vision, one-sided weakness or slurred speech  1. What are your last 5 BP readings? 148/90  2. Are you having any other symptoms (ex. Dizziness, headache, blurred vision, passed out)? lightheaded  3. What is your BP issue? Patient concerned her BP is too high

## 2019-03-18 NOTE — Telephone Encounter (Addendum)
[name] reports that for the last 2 weeks, her BP has consistently been 140-160/90 and she has been experiencing some lightheadedness.  She says the lightheadedness mostly occurs when she is sitting down.  She states she has not gained weight and she eats a lot salt diet. She has not stopped smoking. She denies CP, SOB, swelling. She understands she will be called with Dr. Antionette Char recommendations.

## 2019-03-19 NOTE — Telephone Encounter (Signed)
Recommend trial of amlodipine 5 mg daily and decrease sodium intake. thanks

## 2019-03-20 MED ORDER — AMLODIPINE BESYLATE 5 MG PO TABS: 5.0000 mg | ORAL_TABLET | Freq: Every day | ORAL | 3 refills | Status: DC

## 2019-03-20 NOTE — Telephone Encounter (Signed)
Instructed patient to START AMLODIPINE 5 mg daily. She will monitor BP and call if BP is consistently over 140/90 after several days. She was grateful for assistance.

## 2019-05-24 ENCOUNTER — Other Ambulatory Visit: Payer: Self-pay

## 2019-05-24 ENCOUNTER — Ambulatory Visit (INDEPENDENT_AMBULATORY_CARE_PROVIDER_SITE_OTHER): Payer: BLUE CROSS/BLUE SHIELD | Admitting: Family Medicine

## 2019-05-24 ENCOUNTER — Encounter: Payer: Self-pay | Admitting: Family Medicine

## 2019-05-24 ENCOUNTER — Encounter: Payer: Self-pay | Admitting: *Deleted

## 2019-05-24 VITALS — BP 120/74 | HR 72 | Temp 98.6°F | Resp 12 | Ht 62.0 in

## 2019-05-24 DIAGNOSIS — F419 Anxiety disorder, unspecified: Secondary | ICD-10-CM

## 2019-05-24 DIAGNOSIS — Z634 Disappearance and death of family member: Secondary | ICD-10-CM

## 2019-05-24 DIAGNOSIS — F4321 Adjustment disorder with depressed mood: Secondary | ICD-10-CM | POA: Diagnosis not present

## 2019-05-24 MED ORDER — ALPRAZOLAM 0.5 MG PO TABS: 0.5000 mg | ORAL_TABLET | Freq: Two times a day (BID) | ORAL | 1 refills | Status: DC | PRN

## 2019-05-24 NOTE — Assessment & Plan Note (Signed)
She has taken Xanax in the past and has tolerated well. She does not feel like she needs daily medication. Xanax 0.5 mg twice daily as needed recommended. We discussed some side effects of medication. Instructed about warning signs. Continue counseling. Follow-up in 3 months.

## 2019-05-24 NOTE — Patient Instructions (Signed)
A few things to remember from today's visit:   Anxiety disorder, unspecified type - Plan: ALPRAZolam (XANAX) 0.5 MG tablet   Complicated Grief Grief is a normal response to the death of someone close to you. Feelings of fear, anger, and guilt can affect almost everyone who loses a loved one. It is also common to have symptoms of depression while you are grieving. These include problems with sleep, loss of appetite, and lack of energy. They may last for weeks or months after a loss. Complicated grief is different from normal grief or depression. Normal grieving involves sadness and feelings of loss, but those feelings get better and heal over time. Complicated grief is a severe type of grief that lasts for a long time, usually for several months to a year or longer. It interferes with your ability to function normally. Complicated grief may require treatment from a mental health care provider. What are the causes? The cause of this condition is not known. It is not clear why some people continue to struggle with grief and others do not. What increases the risk? You are more likely to develop this condition if:  The death of your loved one was sudden or unexpected.  The death of your loved one was due to a violent event.  Your loved one died from suicide.  Your loved one was a child or a young person.  You were very close to your loved one, or you were dependent on him or her.  You have a history of depression or anxiety. What are the signs or symptoms? Symptoms of this condition include:  Feeling disbelief or having a lack of emotion (numbness).  Being unable to enjoy good memories of your loved one.  Needing to avoid anything or anyone that reminds you of your loved one.  Being unable to stop thinking about the death.  Feeling intense anger or guilt.  Feeling alone and hopeless.  Feeling that your life is meaningless and empty.  Losing the desire to move on with your life.  How is this diagnosed? This condition may be diagnosed based on:  Your symptoms. Complicated grief will be diagnosed if you have ongoing symptoms of grief for 6-12 months or longer.  The effect of symptoms on your life. You may be diagnosed with this condition if your symptoms are interfering with your ability to live your life. Your health care provider may recommend that you see a mental health care provider. Many symptoms of depression are similar to the symptoms of complicated grief. It is important to be evaluated for complicated grief along with other mental health conditions. How is this treated? This condition is most commonly treated with talk therapy. This therapy is offered by a mental health specialist (psychiatrist). During therapy:  You will learn healthy ways to cope with the loss of your loved one.  Your mental health care provider may recommend antidepressant medicines. Follow these instructions at home: Lifestyle   Take care of yourself. ? Eat on a regular basis, and maintain a healthy diet. Eat plenty of fruits, vegetables, lean protein, and whole grains. ? Try to get some exercise each day. Aim for 30 minutes of exercise on most days of the week. ? Keep a consistent sleep schedule. Try to get 8 or more hours of sleep each night. ? Start doing the things that you used to enjoy.  Do not use drugs or alcohol to ease your symptoms.  Spend time with friends and loved ones. General  instructions  Take over-the-counter and prescription medicines only as told by your health care provider.  Consider joining a grief (bereavement) support group to help you deal with your loss.  Keep all follow-up visits as told by your health care provider. This is important. Contact a health care provider if:  Your symptoms prevent you from functioning normally.  Your symptoms do not get better with treatment. Get help right away if:  You have serious thoughts about hurting yourself  or someone else.  You have suicidal feelings. If you ever feel like you may hurt yourself or others, or have thoughts about taking your own life, get help right away. You can go to your nearest emergency department or call:  Your local emergency services (911 in the [place]  A suicide crisis helpline, such as the [place] at 660-671-4087. This is open 24 hours a day. Summary  Complicated grief is a severe type of grief that lasts for a long time. This grief is not likely to go away on its own. Get the help you need.  Some griefs are more difficult than others and can cause this condition. You may need a certain type of treatment to help you recover if the loss of your loved one was sudden, violent, or due to suicide.  You may feel guilty about moving on with your life. Getting help does not mean that you are forgetting your loved one. It means that you are taking care of yourself.  Complicated grief is best treated with talk therapy. Medicines may also be prescribed.  Seek the help you need, and find support that will help you recover. This information is not intended to replace advice given to you by your health care provider. Make sure you discuss any questions you have with your health care provider. Document Released: 12/12/2005 Document Revised: 09/27/2017 Document Reviewed: 09/27/2017 Elsevier Interactive Patient Education  2019 [employer].  Please be sure medication list is accurate. If a new problem present, please set up appointment sooner than planned today.

## 2019-05-24 NOTE — Progress Notes (Signed)
ACUTE VISIT   HPI:  Chief Complaint  Patient presents with   Anxiety    [name] is a 58 y.o. female, who is here today complaining of worsening anxiety exacerbated by the death of her 72 yo son. A few weeks ago her son passed away unexpected. She is following with counselor,weekly. According to pt,it was recommended to consider pharmacologic treatment.  She d thinks she needs something to help her sleep for short period of time,she doe s not want to take daily med for long time.  + Depressed mood,feeling hopeless, and not sleeping. She wakes up several times during the night. She had an episode of anxiety a few days ago. Her husband was back to work,she woke up and nobody in the house,she felt chest tightness and fear. Denies suicidal thoughts.  She has been on Xanax in the past.   Depression screen [facility] 2/9 05/24/2019 12/29/2017  Decreased Interest 3 0  Down, Depressed, Hopeless 3 0  PHQ - 2 Score 6 0  Altered sleeping 3 -  Tired, decreased energy 2 -  Change in appetite 0 -  Feeling bad or failure about yourself  2 -  Trouble concentrating 0 -  Moving slowly or fidgety/restless 2 -  Suicidal thoughts 0 -  PHQ-9 Score 15 -  Difficult doing work/chores Somewhat difficult -   GAD 7 : Generalized Anxiety Score 05/24/2019  Nervous, Anxious, on Edge 2  Control/stop worrying 2  Worry too much - different things 2  Trouble relaxing 1  Restless 2  Easily annoyed or irritable 2  Afraid - awful might happen 2  Total GAD 7 Score 13  Anxiety Difficulty Somewhat difficult     Review of Systems  Constitutional: Positive for activity change, appetite change and fatigue.  Respiratory: Negative for shortness of breath.   Cardiovascular: Negative for chest pain and palpitations.  Gastrointestinal: Negative for abdominal pain, nausea and vomiting.  Psychiatric/Behavioral: Positive for sleep disturbance. Negative for hallucinations and suicidal ideas. The patient is  nervous/anxious.     Current Outpatient Medications on File Prior to Visit  Medication Sig Dispense Refill   Acetaminophen-Caffeine (EXCEDRIN TENSION HEADACHE PO) Take 1-2 tablets by mouth as needed. headache     albuterol (PROVENTIL HFA;VENTOLIN HFA) 108 (90 Base) MCG/ACT inhaler Inhale into the lungs.     amLODipine (NORVASC) 5 MG tablet Take 1 tablet (5 mg total) by mouth daily. 90 tablet 3   aspirin 81 MG tablet Take 81 mg by mouth daily.     BIOTIN PO Take 1 Dose by mouth daily.      calcium carbonate (OS-CAL) 600 MG TABS Take 1,200 mg by mouth 2 (two) times daily.      estradiol (ESTRACE) 2 MG tablet Take 2 mg by mouth 2 (two) times daily.      fluticasone (FLONASE) 50 MCG/ACT nasal spray Place 1 spray into both nostrils 2 (two) times daily. 16 g 3   nitroGLYCERIN (NITROSTAT) 0.4 MG SL tablet Place 1 tablet (0.4 mg total) under the tongue every 5 (five) minutes as needed for chest pain. 75 tablet 1   omeprazole (PRILOSEC) 20 MG capsule Take 1 capsule (20 mg total) by mouth daily. 90 capsule 1   simvastatin (ZOCOR) 80 MG tablet Take 1 tablet (80 mg total) by mouth at bedtime. 90 tablet 3   SYNTHROID 100 MCG tablet TAKE 1 TABLET (100 MCG TOTAL) BY MOUTH DAILY BEFORE BREAKFAST. 30 tablet 11   valACYclovir (VALTREX)  1000 MG tablet      No current facility-administered medications on file prior to visit.      Past Medical History:  Diagnosis Date   Abscess, periappendiceal    periappendiceal adhesions periappendiceal adhesions. This was associated with  Subsequent to that time she has had repeat laparoscopies for chronic, particularly right sided, pelvic pain on two occasions, one in 2000 and again in June of 2003.  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   Ulcer disease    currently asymptomatic and not on medication   Allergies  Allergen Reactions   Promethazine Hcl Anaphylaxis, Hives and Other (See Comments)    Lungs swell and cant breathe    Social History   Socioeconomic History   Marital status: Married    Spouse name: Not on file   Number of children: Not on file   Years of education: Not on file   Highest education level: Not on file  Occupational History   Not on file  Social Needs   Financial resource strain: Not on file   Food insecurity:    Worry: Not on file    Inability: Not on file   Transportation needs:    Medical: Not on file    Non-medical: Not on file  Tobacco Use   Smoking status: Current Some Day Smoker    Packs/day: 0.50    Years: 19.00    Pack years: 9.50    Types: Cigarettes    Last attempt to quit: 06/05/2017    Years since quitting: 1.9   Smokeless tobacco: Never Used  Substance and Sexual Activity   Alcohol use: Yes    Comment: Drinks wine occas   Drug use: No   Sexual activity: Yes    Birth control/protection: Surgical  Lifestyle   Physical activity:    Days per week: Not on file    Minutes per session: Not on file   Stress: Not on file  Relationships   Social connections:    Talks on phone: Not on file    Gets together: Not on file    Attends religious service: Not on file    Active member of club or organization: Not on file    Attends meetings of clubs or organizations: Not on file    Relationship status: Not on file  Other Topics Concern   Not on file  Social History Narrative   She is married with three children, and she has five  sibling who are all still living.    Vitals:   05/24/19 1059  BP: 120/74  Pulse: 72  Resp: 12  Temp: 98.6 F (37 C)  SpO2: 98%   Body mass index is 22.31 kg/m.      Physical Exam  Nursing note and vitals reviewed. Constitutional: She is oriented to person, place, and time. She appears well-developed and well-nourished. No  distress.  HENT:  Head: Normocephalic and atraumatic.  Eyes: Conjunctivae and EOM are normal.  Cardiovascular: Normal rate and regular rhythm.  No murmur heard. Respiratory: Effort normal and breath sounds normal. No respiratory distress.  Neurological: She is alert and oriented to person, place, and time. She has normal strength. Gait normal.  Skin: Skin is warm. No erythema.  Psychiatric: Her speech is normal. Her mood appears anxious. Her affect is labile. Cognition and memory are normal. She exhibits a depressed mood. She expresses no suicidal ideation. She expresses no suicidal plans.    ASSESSMENT AND PLAN:   Ms. Almina was seen today for anxiety.  Diagnoses and all orders for this visit:  Anxiety disorder, unspecified type -     ALPRAZolam (XANAX) 0.5 MG tablet; Take 1 tablet (0.5 mg total) by mouth 2 (two) times daily as needed for anxiety.  Grief at loss of child   Anxiety disorder She has taken Xanax in the past and has tolerated well. She does not feel like she needs daily medication. Xanax 0.5 mg twice daily as needed recommended. We discussed some side effects of medication. Instructed about warning signs. Continue counseling. Follow-up in 3 months.  19 min face to face OV. > 50% was dedicated to discussion of Dx, prognosis, treatment options, and side effects of medication.   Return in about 3 months (around 08/24/2019).    Ronnisha Felber G. [place], MD  [facility]. [place] office.

## 2019-06-19 ENCOUNTER — Telehealth: Payer: Self-pay | Admitting: Family Medicine

## 2019-06-19 ENCOUNTER — Other Ambulatory Visit: Payer: Self-pay

## 2019-06-19 ENCOUNTER — Ambulatory Visit (INDEPENDENT_AMBULATORY_CARE_PROVIDER_SITE_OTHER): Payer: BC Managed Care – PPO | Admitting: Family Medicine

## 2019-06-19 ENCOUNTER — Encounter: Payer: Self-pay | Admitting: Family Medicine

## 2019-06-19 DIAGNOSIS — F4321 Adjustment disorder with depressed mood: Secondary | ICD-10-CM | POA: Diagnosis not present

## 2019-06-19 DIAGNOSIS — F419 Anxiety disorder, unspecified: Secondary | ICD-10-CM

## 2019-06-19 MED ORDER — SERTRALINE HCL 25 MG PO TABS: 25.0000 mg | ORAL_TABLET | Freq: Every day | ORAL | 1 refills | Status: DC

## 2019-06-19 NOTE — Telephone Encounter (Signed)
Forms placed to provider's desk for completion.

## 2019-06-19 NOTE — Telephone Encounter (Signed)
Patient states she needs her FMLA paperwork faxed to [phone] upon completion.

## 2019-06-19 NOTE — Progress Notes (Signed)
Virtual Visit via Video Note   I connected with Christina Watts on 06/23/19 at 12:00 PM EDT by a video enabled telemedicine application and verified that I am speaking with the correct person using two identifiers.  Location patient: home Location provider:work office Persons participating in the virtual visit: patient, provider  I discussed the limitations of evaluation and management by telemedicine and the availability of in person appointments. The patient expressed understanding and agreed to proceed.   HPI: Christina Watts is a 58 yo female following today on her last visit. She was last seen on 05/24/19. Worsening anxiety and depressed mood since the death of her son. She is taking Alprazolam 0.5 daily at night,it has helped her sleep better.  Still having crying spells,no motivation, feeling fatigue. Denies suicidal thoughts.  She does not feel ready to go back to work at this time.  ROS: See pertinent positives and negatives per HPI.  Past Medical History:  Diagnosis Date  . 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 This was associated with  Subsequent to that time she has had repeat laparoscopies for chronic, particularly right sided, pelvic pain on two occasions, one in 2000 and again in June of 2003.  At each time, pelvic adhesions were noted but no other significant pelvic pathology  . Adenomyosis    uterine adenomyosis, in 1995 total abdominal hysterectomy, right ovarian cystectomy  . Cancer ([place])    melanoma - right leg - good now  . GERD (gastroesophageal reflux disease)   . Headache    Migraines  . Hypercholesterolemia   . Hyperlipidemia   . Hypothyroidism   . Other and unspecified ovarian cysts    long history of recurring ovarian cysts  . Right bundle branch block   . Thyroid disease   . Tobacco abuse   . 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 Ulcer disease    currently asymptomatic and not on medication    Past  Surgical History:  Procedure Laterality Date  . ABDOMINAL HYSTERECTOMY    . APPENDECTOMY     for obliteration of the appendiceal tip  . BREAST BIOPSY  02/1992   biopsy of benign breast mass  . CARDIAC CATHETERIZATION N/A 05/14/2015   Procedure: Left Heart Cath and Coronary Angiography;  Surgeon: [name], MD;  Location: [facility];  Service: Cardiovascular;  Laterality: N/A;  . ESOPHAGOGASTRODUODENOSCOPY  12/06/2005   Normal esophagogastroduodenoscopy --  Earle Gell, M.D.  . HARDWARE REMOVAL Left 04/19/2017   Procedure: HARDWARE REMOVAL LEFT ANKLE;  Surgeon: Earlie Server, MD;  Location: [facility];  Service: Orthopedics;  Laterality: Left;  . left ankle surgery    . OVARIAN CYST REMOVAL  1995  . TOTAL ABDOMINAL HYSTERECTOMY W/ BILATERAL SALPINGOOPHORECTOMY  1995  . TOTAL THYROIDECTOMY  05/13/2004   Multiple thyroid nodules, dominant mass, left thyroid lobe -- by, Earnstine Regal, M.D  . TUBAL LIGATION  1989    Family History  Problem Relation Age of Onset  . Heart disease Father   . Heart attack Father   . Asthma Mother   . Diabetes Mother   . Heart disease Other        fathers side has a strong history of heart disease  . Hyperlipidemia Other        strong family history of    Social History   Socioeconomic History  . Marital status: Married    Spouse name: Not on file  .  Number of children: Not on file  . Years of education: Not on file  . Highest education level: Not on file  Occupational History  . Not on file  Social Needs  . Financial resource strain: Not on file  . Food insecurity    Worry: Not on file    Inability: Not on file  . Transportation needs    Medical: Not on file    Non-medical: Not on file  Tobacco Use  . Smoking status: Current Some Day Smoker    Packs/day: 0.50    Years: 19.00    Pack years: 9.50    Types: Cigarettes    Last attempt to quit: 06/05/2017    Years since quitting: 2.0  . Smokeless tobacco: Never  Used  Substance and Sexual Activity  . Alcohol use: Yes    Comment: Drinks wine occas  . Drug use: No  . Sexual activity: Yes    Birth control/protection: Surgical  Lifestyle  . Physical activity    Days per week: Not on file    Minutes per session: Not on file  . Stress: Not on file  Relationships  . Social Herbalist on phone: Not on file    Gets together: Not on file    Attends religious service: Not on file    Active member of club or organization: Not on file    Attends meetings of clubs or organizations: Not on file    Relationship status: Not on file  . Intimate partner violence    Fear of current or ex partner: Not on file    Emotionally abused: Not on file    Physically abused: Not on file    Forced sexual activity: Not on file  Other Topics Concern  . Not on file  Social History Narrative   She is married with three children, and she has five sibling who are all still living.      Current Outpatient Medications:  .  Acetaminophen-Caffeine (EXCEDRIN TENSION HEADACHE PO), Take 1-2 tablets by mouth as needed. headache, Disp: , Rfl:  .  albuterol (PROVENTIL HFA;VENTOLIN HFA) 108 (90 Base) MCG/ACT inhaler, Inhale into the lungs., Disp: , Rfl:  .  ALPRAZolam (XANAX) 0.5 MG tablet, Take 1 tablet (0.5 mg total) by mouth 2 (two) times daily as needed for anxiety., Disp: 45 tablet, Rfl: 1 .  amLODipine (NORVASC) 5 MG tablet, Take 1 tablet (5 mg total) by mouth daily., Disp: 90 tablet, Rfl: 3 .  aspirin 81 MG tablet, Take 81 mg by mouth daily., Disp: , Rfl:  .  BIOTIN PO, Take 1 Dose by mouth daily. , Disp: , Rfl:  .  calcium carbonate (OS-CAL) 600 MG TABS, Take 1,200 mg by mouth 2 (two) times daily. , Disp: , Rfl:  .  estradiol (ESTRACE) 2 MG tablet, Take 2 mg by mouth 2 (two) times daily. , Disp: , Rfl:  .  fluticasone (FLONASE) 50 MCG/ACT nasal spray, Place 1 spray into both nostrils 2 (two) times daily., Disp: 16 g, Rfl: 3 .  nitroGLYCERIN (NITROSTAT) 0.4 MG SL  tablet, Place 1 tablet (0.4 mg total) under the tongue every 5 (five) minutes as needed for chest pain., Disp: 75 tablet, Rfl: 1 .  omeprazole (PRILOSEC) 20 MG capsule, Take 1 capsule (20 mg total) by mouth daily., Disp: 90 capsule, Rfl: 1 .  sertraline (ZOLOFT) 25 MG tablet, Take 1 tablet (25 mg total) by mouth daily., Disp: 30 tablet, Rfl: 1 .  simvastatin (ZOCOR) 80 MG tablet, Take 1 tablet (80 mg total) by mouth at bedtime., Disp: 90 tablet, Rfl: 3 .  SYNTHROID 100 MCG tablet, TAKE 1 TABLET (100 MCG TOTAL) BY MOUTH DAILY BEFORE BREAKFAST., Disp: 30 tablet, Rfl: 11 .  valACYclovir (VALTREX) 1000 MG tablet, , Disp: , Rfl:   EXAM:  VITALS per patient if applicable:N/A  GENERAL: alert, oriented, appears well and in no acute distress  HEENT: atraumatic, conjunttiva clear, no obvious abnormalities on inspection of external nose and ears  NECK: normal movements of the head and neck  LUNGS: on inspection no signs of respiratory distress, breathing rate appears normal, no obvious gross SOB, gasping or wheezing  CV: no obvious cyanosis  Christina: moves all visible extremities without noticeable abnormality  PSYCH/NEURO: pleasant and cooperative, poor groomed,good eye contact, depressed mood and anxious,no suicidal. Speech and thought processing grossly intact  ASSESSMENT AND PLAN:  Discussed the following assessment and plan:  Anxiety disorder, unspecified type - Plan: sertraline (ZOLOFT) 25 MG tablet. No changes in Alprazolam 0.5 mg. Today Sertraline added,side effects discussed. Instructed about warning signs.  Grief reaction - Plan: Continue counseling.    A new letter to go back to work July 09, 2019 will be provided. FMLA form will be amended.   I discussed the assessment and treatment plan with the patient. She was provided an opportunity to ask questions and all were answered. The patient agreed with the plan and demonstrated an understanding of the instructions.    Return in  about 6 weeks (around 07/31/2019) for Anxiety and grief reaction..    Larine Fielding Martinique, MD

## 2019-06-20 NOTE — Telephone Encounter (Signed)
Patient is calling to check status of FMLA paperwork.  Patient needs this before July 1st.  Patient would like to know if it has been faxed please call patient back if it has been done.  Call back # [phone]

## 2019-06-21 NOTE — Telephone Encounter (Signed)
Forms completed and faxed to [employer] as requested.

## 2019-06-23 ENCOUNTER — Encounter: Payer: Self-pay | Admitting: Family Medicine

## 2019-06-24 ENCOUNTER — Ambulatory Visit (INDEPENDENT_AMBULATORY_CARE_PROVIDER_SITE_OTHER): Payer: BC Managed Care – PPO | Admitting: Family Medicine

## 2019-06-24 ENCOUNTER — Ambulatory Visit: Payer: BC Managed Care – PPO | Admitting: Family Medicine

## 2019-06-24 ENCOUNTER — Other Ambulatory Visit: Payer: Self-pay

## 2019-06-24 ENCOUNTER — Encounter: Payer: Self-pay | Admitting: Family Medicine

## 2019-06-24 DIAGNOSIS — S60562A Insect bite (nonvenomous) of left hand, initial encounter: Secondary | ICD-10-CM | POA: Diagnosis not present

## 2019-06-24 DIAGNOSIS — F4321 Adjustment disorder with depressed mood: Secondary | ICD-10-CM

## 2019-06-24 DIAGNOSIS — M25442 Effusion, left hand: Secondary | ICD-10-CM | POA: Diagnosis not present

## 2019-06-24 DIAGNOSIS — W57XXXA Bitten or stung by nonvenomous insect and other nonvenomous arthropods, initial encounter: Secondary | ICD-10-CM

## 2019-06-24 MED ORDER — DOXYCYCLINE HYCLATE 100 MG PO TABS: 100.0000 mg | ORAL_TABLET | Freq: Two times a day (BID) | ORAL | 0 refills | Status: AC

## 2019-06-24 NOTE — Progress Notes (Signed)
Virtual Visit via Video Note  I connected with Christina Watts on 06/24/19 at 11:30 AM EDT by a video enabled telemedicine application and verified that I am speaking with the correct person using two identifiers.  Location patient: home Location provider:work or home office Persons participating in the virtual visit: patient, provider  I discussed the limitations of evaluation and management by telemedicine and the availability of in person appointments. The patient expressed understanding and agreed to proceed.   HPI: Pt seen for acute concern.  Typically seen by Dr. Martinique.  Pt states something bit the back of her L hand 1 wk ago.  The bit was from an insect or a spider while sitting out on the porch.  Pt felt the bite but did not see what did it. The bite was on the dorsum of the L hand at the 3rd MCP joint.  Pt did not seek care at that time.  She took benadryl po and topical for itching.  She also used ice and heat.  Pt states now her L middle finger is swollen and she cannot completely bend it.  Pt denies fever, chills, other joint pain, rash, n/v, erythema, or increased warmth.  Pt mentions she started xanax and zoloft a few days ago.  Pt is grieving the death of her father and her son. Pt is in counseling.  Pt's husband is supportive.  ROS: See pertinent positives and negatives per HPI.  Past Medical History:  Diagnosis Date  . 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 This was associated with  Subsequent to that time she has had repeat laparoscopies for chronic, particularly right sided, pelvic pain on two occasions, one in 2000 and again in June of 2003.  At each time, pelvic adhesions were noted but no other significant pelvic pathology  . Adenomyosis    uterine adenomyosis, in 1995 total abdominal hysterectomy, right ovarian cystectomy  . Cancer ([place])    melanoma - right leg - good now  . GERD (gastroesophageal reflux disease)   . Headache     Migraines  . Hypercholesterolemia   . Hyperlipidemia   . Hypothyroidism   . Other and unspecified ovarian cysts    long history of recurring ovarian cysts  . Right bundle branch block   . Thyroid disease   . Tobacco abuse   . 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 Ulcer disease    currently asymptomatic and not on medication    Past Surgical History:  Procedure Laterality Date  . ABDOMINAL HYSTERECTOMY    . APPENDECTOMY     for obliteration of the appendiceal tip  . BREAST BIOPSY  02/1992   biopsy of benign breast mass  . CARDIAC CATHETERIZATION N/A 05/14/2015   Procedure: Left Heart Cath and Coronary Angiography;  Surgeon: [name], MD;  Location: [facility];  Service: Cardiovascular;  Laterality: N/A;  . ESOPHAGOGASTRODUODENOSCOPY  12/06/2005   Normal esophagogastroduodenoscopy --  Earle Gell, M.D.  . HARDWARE REMOVAL Left 04/19/2017   Procedure: HARDWARE REMOVAL LEFT ANKLE;  Surgeon: Earlie Server, MD;  Location: [facility];  Service: Orthopedics;  Laterality: Left;  . left ankle surgery    . OVARIAN CYST REMOVAL  1995  . TOTAL ABDOMINAL HYSTERECTOMY W/ BILATERAL SALPINGOOPHORECTOMY  1995  . TOTAL THYROIDECTOMY  05/13/2004   Multiple thyroid nodules, dominant mass, left thyroid lobe -- by, Earnstine Regal, M.D  . TUBAL LIGATION  [phone]  Family History  Problem Relation Age of Onset  . Heart disease Father   . Heart attack Father   . Asthma Mother   . Diabetes Mother   . Heart disease Other        fathers side has a strong history of heart disease  . Hyperlipidemia Other        strong family history of    Current Outpatient Medications:  .  Acetaminophen-Caffeine (EXCEDRIN TENSION HEADACHE PO), Take 1-2 tablets by mouth as needed. headache, Disp: , Rfl:  .  albuterol (PROVENTIL HFA;VENTOLIN HFA) 108 (90 Base) MCG/ACT inhaler, Inhale into the lungs., Disp: , Rfl:  .  ALPRAZolam (XANAX) 0.5 MG tablet, Take 1 tablet (0.5 mg  total) by mouth 2 (two) times daily as needed for anxiety., Disp: 45 tablet, Rfl: 1 .  amLODipine (NORVASC) 5 MG tablet, Take 1 tablet (5 mg total) by mouth daily., Disp: 90 tablet, Rfl: 3 .  aspirin 81 MG tablet, Take 81 mg by mouth daily., Disp: , Rfl:  .  BIOTIN PO, Take 1 Dose by mouth daily. , Disp: , Rfl:  .  calcium carbonate (OS-CAL) 600 MG TABS, Take 1,200 mg by mouth 2 (two) times daily. , Disp: , Rfl:  .  estradiol (ESTRACE) 2 MG tablet, Take 2 mg by mouth 2 (two) times daily. , Disp: , Rfl:  .  fluticasone (FLONASE) 50 MCG/ACT nasal spray, Place 1 spray into both nostrils 2 (two) times daily., Disp: 16 g, Rfl: 3 .  nitroGLYCERIN (NITROSTAT) 0.4 MG SL tablet, Place 1 tablet (0.4 mg total) under the tongue every 5 (five) minutes as needed for chest pain., Disp: 75 tablet, Rfl: 1 .  omeprazole (PRILOSEC) 20 MG capsule, Take 1 capsule (20 mg total) by mouth daily., Disp: 90 capsule, Rfl: 1 .  sertraline (ZOLOFT) 25 MG tablet, Take 1 tablet (25 mg total) by mouth daily., Disp: 30 tablet, Rfl: 1 .  simvastatin (ZOCOR) 80 MG tablet, Take 1 tablet (80 mg total) by mouth at bedtime., Disp: 90 tablet, Rfl: 3 .  SYNTHROID 100 MCG tablet, TAKE 1 TABLET (100 MCG TOTAL) BY MOUTH DAILY BEFORE BREAKFAST., Disp: 30 tablet, Rfl: 11 .  valACYclovir (VALTREX) 1000 MG tablet, , Disp: , Rfl:   EXAM:  VITALS per patient if applicable: RR between 56-43 bpm  GENERAL: alert, oriented, appears well and in no acute distress, becomes tearful.  HEENT: atraumatic, conjunctiva clear, no obvious abnormalities on inspection of external nose and ears  NECK: normal movements of the head and neck  LUNGS: on inspection no signs of respiratory distress, breathing rate appears normal, no obvious gross SOB, gasping or wheezing  CV: no obvious cyanosis  MS: dorsum of L hand with healing punctate lesion proximal to 3 rd MCP joint, mild erythema.  Edema of L 3rd PIP with reduced flexion.  Moves all visible extremities  without noticeable abnormality  PSYCH/NEURO: pleasant and cooperative, no obvious depression or anxiety, speech and thought processing grossly intact  ASSESSMENT AND PLAN:  Discussed the following assessment and plan:  Insect bite of left hand, initial encounter  -as unclear what bit pt, discussed starting doxycycline for possible tick bite or infection caused by the bite -given precautions -discussed close f/u. -tdap up to date, given 01/08/16 - Plan: doxycycline (VIBRA-TABS) 100 MG tablet  Finger joint swelling, left -Continue ice, tylenol if needed for pain -f/u in 4 days with pcp  Grief -continue zoloft 25 mg and xanax 0.5 mg prn -pt given  support -continue counseling -f/u with pcp in the next few wks   I discussed the assessment and treatment plan with the patient. The patient was provided an opportunity to ask questions and all were answered. The patient agreed with the plan and demonstrated an understanding of the instructions.   The patient was advised to call back or seek an in-person evaluation if the symptoms worsen or if the condition fails to improve as anticipated.   Billie Ruddy, MD

## 2019-06-24 NOTE — Progress Notes (Signed)
Pt did not join e-visit after multiple doxy requests.

## 2019-06-25 ENCOUNTER — Telehealth: Payer: Self-pay | Admitting: *Deleted

## 2019-06-25 NOTE — Telephone Encounter (Signed)
Spoke with patient and scheduled in office visit for 07/08/2019.

## 2019-06-25 NOTE — Telephone Encounter (Signed)
   Copied from [place] [id number]. Topic: Appointment Scheduling - Scheduling Inquiry for Clinic >> Jun 25, 2019  1:23 PM Reyne Dumas L wrote: Reason for CRM:   Pt states she was told at her virtual visit that she would need to call the office and schedule a face to face with Dr. Martinique for next week.  Called office, on hold over 2 minutes. >> Jun 25, 2019  2:21 PM Cox, Melburn Hake, CMA wrote: Do you want this in-office or virtual?

## 2019-07-12 ENCOUNTER — Ambulatory Visit: Payer: BC Managed Care – PPO | Admitting: Family Medicine

## 2019-07-12 ENCOUNTER — Other Ambulatory Visit: Payer: Self-pay

## 2019-07-12 ENCOUNTER — Encounter: Payer: Self-pay | Admitting: Family Medicine

## 2019-07-12 VITALS — BP 130/80 | HR 96 | Temp 98.0°F | Resp 12 | Ht 62.0 in | Wt 120.1 lb

## 2019-07-12 DIAGNOSIS — F4321 Adjustment disorder with depressed mood: Secondary | ICD-10-CM

## 2019-07-12 DIAGNOSIS — F419 Anxiety disorder, unspecified: Secondary | ICD-10-CM | POA: Diagnosis not present

## 2019-07-12 MED ORDER — SERTRALINE HCL 50 MG PO TABS: 50.0000 mg | ORAL_TABLET | Freq: Every day | ORAL | 1 refills | Status: DC

## 2019-07-12 NOTE — Assessment & Plan Note (Signed)
Problem has improved some but still not well controlled. Sertraline increased from 25 mg to 50 mg daily. Continue counseling weekly. Instructed about warning signs. F/U in 4 weeks,before if needed.

## 2019-07-12 NOTE — Patient Instructions (Signed)
A few things to remember from today's visit:   Anxiety disorder, unspecified type - Plan: sertraline (ZOLOFT) 50 MG tablet  Grief reaction  Sertraline dose increased from 25 mg to 50 mg. No changes in Xanax dose. I will see her back in 3 to 4 weeks.  Please be sure medication list is accurate. If a new problem present, please set up appointment sooner than planned today.

## 2019-07-12 NOTE — Progress Notes (Signed)
HPI:   [name] is a 58 y.o. female, who is here today to follow on recent OV.  Virtual visit on 06/19/19,when Sertraline 25 mg was started for depression and anxiety. Grief,lost her son a few weeks ago. She feels like Sertraline has helped some. She tried to go back to work but still having crying episodes. Feeling angry. She is eating and sleeping better.  Her father also passed away late 30-Mar-2018.  She denies suicidal thoughts.  Having panic attacks,mainly when she os at home by herself. She feels better when her husband is with her.  She is also on Xanax 0.5 mg at night prn.  She is tolerating medications well. No side effects.  She is meeting with counselor weekly.  She would like to go back to work but part time for now.   Review of Systems  Constitutional: Positive for fatigue. Negative for chills and fever.  HENT: Negative for mouth sores and sore throat.   Respiratory: Negative for cough, shortness of breath and wheezing.   Cardiovascular: Negative for chest pain.  Gastrointestinal: Negative for abdominal pain, nausea and vomiting.  Neurological: Negative for syncope and headaches.  Psychiatric/Behavioral: Negative for confusion and hallucinations.  Rest see pertinent positives and negatives per HPI.  Current Outpatient Medications on File Prior to Visit  Medication Sig Dispense Refill  . Acetaminophen-Caffeine (EXCEDRIN TENSION HEADACHE PO) Take 1-2 tablets by mouth as needed. headache    . albuterol (PROVENTIL HFA;VENTOLIN HFA) 108 (90 Base) MCG/ACT inhaler Inhale into the lungs.    . ALPRAZolam (XANAX) 0.5 MG tablet Take 1 tablet (0.5 mg total) by mouth 2 (two) times daily as needed for anxiety. 45 tablet 1  . amLODipine (NORVASC) 5 MG tablet Take 1 tablet (5 mg total) by mouth daily. 90 tablet 3  . aspirin 81 MG tablet Take 81 mg by mouth daily.    Marland Kitchen BIOTIN PO Take 1 Dose by mouth daily.     . Butalbital-APAP-Caffeine 50-300-40 MG CAPS TAKE 1 OR  2 CAPS BY MOUTH EVERY 4 TO 6 HOURS AS NEEDED FOR HEADACHE    . calcium carbonate (OS-CAL) 600 MG TABS Take 1,200 mg by mouth 2 (two) times daily.     Marland Kitchen estradiol (ESTRACE) 2 MG tablet Take 2 mg by mouth 2 (two) times daily.     . fluticasone (FLONASE) 50 MCG/ACT nasal spray Place 1 spray into both nostrils 2 (two) times daily. 16 g 3  . nitroGLYCERIN (NITROSTAT) 0.4 MG SL tablet Place 1 tablet (0.4 mg total) under the tongue every 5 (five) minutes as needed for chest pain. 75 tablet 1  . omeprazole (PRILOSEC) 20 MG capsule Take 1 capsule (20 mg total) by mouth daily. 90 capsule 1  . simvastatin (ZOCOR) 80 MG tablet Take 1 tablet (80 mg total) by mouth at bedtime. 90 tablet 3  . SYNTHROID 100 MCG tablet TAKE 1 TABLET (100 MCG TOTAL) BY MOUTH DAILY BEFORE BREAKFAST. 30 tablet 11  . valACYclovir (VALTREX) 1000 MG tablet     . XIIDRA 5 % SOLN INSTILL 1 DROP INTO BOTH EYES TWICE A DAY     No current facility-administered medications on file prior to visit.      Past Medical History:  Diagnosis Date  . 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 This was associated with  Subsequent to that time she has had repeat laparoscopies for chronic, particularly right sided, pelvic pain on two occasions, one in 03/31/1999 and  again in June of 2003.  At each time, pelvic adhesions were noted but no other significant pelvic pathology  . Adenomyosis    uterine adenomyosis, in 1995 total abdominal hysterectomy, right ovarian cystectomy  . Cancer ([place])    melanoma - right leg - good now  . GERD (gastroesophageal reflux disease)   . Headache    Migraines  . Hypercholesterolemia   . Hyperlipidemia   . Hypothyroidism   . Other and unspecified ovarian cysts    long history of recurring ovarian cysts  . Right bundle branch block   . Thyroid disease   . Tobacco abuse   . 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 Ulcer disease    currently asymptomatic and not on  medication   Allergies  Allergen Reactions  . Promethazine Hcl Anaphylaxis, Hives and Other (See Comments)    Lungs swell and cant breathe    Social History   Socioeconomic History  . Marital status: Married    Spouse name: Not on file  . Number of children: Not on file  . Years of education: Not on file  . Highest education level: Not on file  Occupational History  . Not on file  Social Needs  . Financial resource strain: Not on file  . Food insecurity    Worry: Not on file    Inability: Not on file  . Transportation needs    Medical: Not on file    Non-medical: Not on file  Tobacco Use  . Smoking status: Current Some Day Smoker    Packs/day: 0.50    Years: 19.00    Pack years: 9.50    Types: Cigarettes    Last attempt to quit: 06/05/2017    Years since quitting: 2.1  . Smokeless tobacco: Never Used  Substance and Sexual Activity  . Alcohol use: Yes    Comment: Drinks wine occas  . Drug use: No  . Sexual activity: Yes    Birth control/protection: Surgical  Lifestyle  . Physical activity    Days per week: Not on file    Minutes per session: Not on file  . Stress: Not on file  Relationships  . Social Herbalist on phone: Not on file    Gets together: Not on file    Attends religious service: Not on file    Active member of club or organization: Not on file    Attends meetings of clubs or organizations: Not on file    Relationship status: Not on file  Other Topics Concern  . Not on file  Social History Narrative   She is married with three children, and she has five sibling who are all still living.    Vitals:   07/12/19 1503  BP: 130/80  Pulse: 96  Resp: 12  Temp: 98 F (36.7 C)  SpO2: 97%   Body mass index is 21.97 kg/m.  Physical Exam  Nursing note and vitals reviewed. Constitutional: She is oriented to person, place, and time. She appears well-developed and well-nourished. No distress.  HENT:  Head: Normocephalic and atraumatic.   Mouth/Throat: Oropharynx is clear and moist and mucous membranes are normal.  Eyes: Pupils are equal, round, and reactive to light. Conjunctivae are normal.  Cardiovascular: Normal rate and regular rhythm.  No murmur heard. Respiratory: Effort normal and breath sounds normal. No respiratory distress.  Musculoskeletal:        General: No edema.  Neurological: She is alert  and oriented to person, place, and time. She has normal strength. Gait normal.  Skin: Skin is warm. No erythema.  Psychiatric: Her speech is normal. Her mood appears anxious. Cognition and memory are normal. She exhibits a depressed mood. She expresses no suicidal ideation. She expresses no suicidal plans.  Well groomed,good eye contact.    ASSESSMENT AND PLAN:  Ms. Jaretzi was seen today for follow-up.  Diagnoses and all orders for this visit:  Grief reaction Symptoms still interfering with daily activities and job functions. Letter for work given to allow her to work part time for 3-4 weeks. Sertraline has helped some. Continue weekly counseling.  Anxiety disorder Problem has improved some but still not well controlled. Sertraline increased from 25 mg to 50 mg daily. Continue counseling weekly. Instructed about warning signs. F/U in 4 weeks,before if needed.  Return in about 4 weeks (around 08/09/2019) for 3-4 weeks.    Betty G. Martinique, MD  [facility] [place] office.

## 2019-07-30 DIAGNOSIS — M79645 Pain in left finger(s): Secondary | ICD-10-CM | POA: Diagnosis not present

## 2019-08-05 ENCOUNTER — Other Ambulatory Visit: Payer: Self-pay | Admitting: Family Medicine

## 2019-08-05 DIAGNOSIS — F419 Anxiety disorder, unspecified: Secondary | ICD-10-CM

## 2019-08-09 ENCOUNTER — Ambulatory Visit (INDEPENDENT_AMBULATORY_CARE_PROVIDER_SITE_OTHER): Payer: BC Managed Care – PPO | Admitting: Family Medicine

## 2019-08-09 ENCOUNTER — Encounter: Payer: Self-pay | Admitting: Family Medicine

## 2019-08-09 ENCOUNTER — Other Ambulatory Visit: Payer: Self-pay

## 2019-08-09 VITALS — Ht 62.0 in

## 2019-08-09 DIAGNOSIS — F419 Anxiety disorder, unspecified: Secondary | ICD-10-CM

## 2019-08-09 DIAGNOSIS — F4321 Adjustment disorder with depressed mood: Secondary | ICD-10-CM

## 2019-08-09 DIAGNOSIS — Z634 Disappearance and death of family member: Secondary | ICD-10-CM

## 2019-08-09 MED ORDER — SERTRALINE HCL 50 MG PO TABS: 50.0000 mg | ORAL_TABLET | Freq: Every day | ORAL | 1 refills | Status: DC

## 2019-08-09 NOTE — Progress Notes (Signed)
Virtual Visit via Video Note   I connected with Christina Watts on 08/09/19 by a video enabled telemedicine application and verified that I am speaking with the correct person using two identifiers.  Location patient: home Location provider:work office Persons participating in the virtual visit: patient, provider  I discussed the limitations of evaluation and management by telemedicine and the availability of in person appointments. The patient expressed understanding and agreed to proceed.   HPI: Christina Watts is a 58 yo female which Hx of tobacco use,hypothyroidism,and HLD who is following on anxiety and depression.  She was last seen on 07/12/2019, when sertraline dose was increased from 25 mg to 50 mg. She is also on Xanax 0.5 mg taking one half in the morning and 1 tablet at night. She is tolerating medication well. She is sleeping better.  She gets "butterflies" in her chest when she feels anxious. She denies chest pain, dyspnea, palpitations, diaphoresis, abdominal pain, nausea, vomiting, or changes in bowel habits. Still feeling sad but she is able to deal with her lost better. Negative for suicidal thoughts. She has been working part-time and seems like she is doing well. She requesting a letter to be able to go back full-time this coming Monday, 08/12/19.  She is still following with counselor weekly.  ROS: See pertinent positives and negatives per HPI.  Past Medical History:  Diagnosis Date  . 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 This was associated with  Subsequent to that time she has had repeat laparoscopies for chronic, particularly right sided, pelvic pain on two occasions, one in 2000 and again in June of 2003.  At each time, pelvic adhesions were noted but no other significant pelvic pathology  . Adenomyosis    uterine adenomyosis, in 1995 total abdominal hysterectomy, right ovarian cystectomy  . Cancer ([place])    melanoma - right  leg - good now  . GERD (gastroesophageal reflux disease)   . Headache    Migraines  . Hypercholesterolemia   . Hyperlipidemia   . Hypothyroidism   . Other and unspecified ovarian cysts    long history of recurring ovarian cysts  . Right bundle branch block   . Thyroid disease   . Tobacco abuse   . 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 Ulcer disease    currently asymptomatic and not on medication    Past Surgical History:  Procedure Laterality Date  . ABDOMINAL HYSTERECTOMY    . APPENDECTOMY     for obliteration of the appendiceal tip  . BREAST BIOPSY  02/1992   biopsy of benign breast mass  . CARDIAC CATHETERIZATION N/A 05/14/2015   Procedure: Left Heart Cath and Coronary Angiography;  Surgeon: [name], MD;  Location: [facility];  Service: Cardiovascular;  Laterality: N/A;  . ESOPHAGOGASTRODUODENOSCOPY  12/06/2005   Normal esophagogastroduodenoscopy --  Earle Gell, M.D.  . HARDWARE REMOVAL Left 04/19/2017   Procedure: HARDWARE REMOVAL LEFT ANKLE;  Surgeon: Earlie Server, MD;  Location: [facility];  Service: Orthopedics;  Laterality: Left;  . left ankle surgery    . OVARIAN CYST REMOVAL  1995  . TOTAL ABDOMINAL HYSTERECTOMY W/ BILATERAL SALPINGOOPHORECTOMY  1995  . TOTAL THYROIDECTOMY  05/13/2004   Multiple thyroid nodules, dominant mass, left thyroid lobe -- by, Earnstine Regal, M.D  . TUBAL LIGATION  1989    Family History  Problem Relation Age of Onset  . Heart disease Father   . Heart  attack Father   . Asthma Mother   . Diabetes Mother   . Heart disease Other        fathers side has a strong history of heart disease  . Hyperlipidemia Other        strong family history of    Social History   Socioeconomic History  . Marital status: Married    Spouse name: Not on file  . Number of children: Not on file  . Years of education: Not on file  . Highest education level: Not on file  Occupational History  . Not on file   Social Needs  . Financial resource strain: Not on file  . Food insecurity    Worry: Not on file    Inability: Not on file  . Transportation needs    Medical: Not on file    Non-medical: Not on file  Tobacco Use  . Smoking status: Current Some Day Smoker    Packs/day: 0.50    Years: 19.00    Pack years: 9.50    Types: Cigarettes    Last attempt to quit: 06/05/2017    Years since quitting: 2.1  . Smokeless tobacco: Never Used  Substance and Sexual Activity  . Alcohol use: Yes    Comment: Drinks wine occas  . Drug use: No  . Sexual activity: Yes    Birth control/protection: Surgical  Lifestyle  . Physical activity    Days per week: Not on file    Minutes per session: Not on file  . Stress: Not on file  Relationships  . Social Herbalist on phone: Not on file    Gets together: Not on file    Attends religious service: Not on file    Active member of club or organization: Not on file    Attends meetings of clubs or organizations: Not on file    Relationship status: Not on file  . Intimate partner violence    Fear of current or ex partner: Not on file    Emotionally abused: Not on file    Physically abused: Not on file    Forced sexual activity: Not on file  Other Topics Concern  . Not on file  Social History Narrative   She is married with three children, and she has five sibling who are all still living.    Current Outpatient Medications:  .  Acetaminophen-Caffeine (EXCEDRIN TENSION HEADACHE PO), Take 1-2 tablets by mouth as needed. headache, Disp: , Rfl:  .  albuterol (PROVENTIL HFA;VENTOLIN HFA) 108 (90 Base) MCG/ACT inhaler, Inhale into the lungs., Disp: , Rfl:  .  ALPRAZolam (XANAX) 0.5 MG tablet, Take 1 tablet (0.5 mg total) by mouth 2 (two) times daily as needed for anxiety (No more than 45 tabs per month.)., Disp: 45 tablet, Rfl: 0 .  amLODipine (NORVASC) 5 MG tablet, Take 1 tablet (5 mg total) by mouth daily., Disp: 90 tablet, Rfl: 3 .  aspirin 81 MG  tablet, Take 81 mg by mouth daily., Disp: , Rfl:  .  BIOTIN PO, Take 1 Dose by mouth daily. , Disp: , Rfl:  .  Butalbital-APAP-Caffeine 50-300-40 MG CAPS, TAKE 1 OR 2 CAPS BY MOUTH EVERY 4 TO 6 HOURS AS NEEDED FOR HEADACHE, Disp: , Rfl:  .  calcium carbonate (OS-CAL) 600 MG TABS, Take 1,200 mg by mouth 2 (two) times daily. , Disp: , Rfl:  .  estradiol (ESTRACE) 2 MG tablet, Take 2 mg by mouth 2 (two) times  daily. , Disp: , Rfl:  .  fluticasone (FLONASE) 50 MCG/ACT nasal spray, Place 1 spray into both nostrils 2 (two) times daily., Disp: 16 g, Rfl: 3 .  nitroGLYCERIN (NITROSTAT) 0.4 MG SL tablet, Place 1 tablet (0.4 mg total) under the tongue every 5 (five) minutes as needed for chest pain., Disp: 75 tablet, Rfl: 1 .  omeprazole (PRILOSEC) 20 MG capsule, TAKE 1 CAPSULE BY MOUTH EVERY DAY, Disp: 90 capsule, Rfl: 1 .  sertraline (ZOLOFT) 50 MG tablet, Take 1 tablet (50 mg total) by mouth daily., Disp: 90 tablet, Rfl: 1 .  simvastatin (ZOCOR) 80 MG tablet, Take 1 tablet (80 mg total) by mouth at bedtime., Disp: 90 tablet, Rfl: 3 .  SYNTHROID 100 MCG tablet, TAKE 1 TABLET (100 MCG TOTAL) BY MOUTH DAILY BEFORE BREAKFAST., Disp: 30 tablet, Rfl: 11 .  valACYclovir (VALTREX) 1000 MG tablet, , Disp: , Rfl:  .  XIIDRA 5 % SOLN, INSTILL 1 DROP INTO BOTH EYES TWICE A DAY, Disp: , Rfl:   EXAM:  VITALS per patient if applicable:Ht 5\' 2"  (1.575 m)   BMI 21.97 kg/m   GENERAL: alert, oriented, appears well and in no acute distress. HEENT: atraumatic, normocephalic, and conjunctiva clear. NECK: normal movements of the head and neck LUNGS: on inspection no signs of respiratory distress, breathing rate appears normal, no obvious gross SOB, gasping or wheezing CV: no obvious cyanosis PSYCH/NEURO: pleasant and cooperative, no significant depression or anxiety, speech and thought processing grossly intact  ASSESSMENT AND PLAN:  Discussed the following assessment and plan:  Grief at loss of child - Plan:  Dealing better with her loss. Continue weekly counseling. Letter for work will be provided.  Anxiety disorder, unspecified type - Plan: sertraline (ZOLOFT) 50 MG tablet Slowly improving. Continue Sertraline 50 mg daily and Xanax 0.5 mg 1.5 tab daily. Side effects of medications discussed.    I discussed the assessment and treatment plan with the patient.  She was provided an opportunity to ask questions and all were answered.  She agreed with the plan and demonstrated an understanding of the instructions.    Return in about 2 months (around 10/09/2019) for anxiety.   Aberdeen Hafen Martinique, MD

## 2019-08-11 ENCOUNTER — Other Ambulatory Visit: Payer: Self-pay | Admitting: Family Medicine

## 2019-08-22 ENCOUNTER — Telehealth: Payer: Self-pay | Admitting: Family Medicine

## 2019-08-22 NOTE — Telephone Encounter (Signed)
Copied from [place] [phone]. Topic: General - Other >> Aug 20, 2019  2:58 PM Rainey Pines A wrote: Lorna Few from [employer] would like the disability paperwork from 8/21 that was scanned into patients chart to be faxed back to her at [phone]- [phone] [facility]. Best contact [phone] ext [phone]

## 2019-08-23 NOTE — Telephone Encounter (Signed)
Form faxed to [place] at [place] as requested.

## 2019-09-12 ENCOUNTER — Other Ambulatory Visit: Payer: Self-pay | Admitting: Cardiovascular Disease

## 2019-09-15 ENCOUNTER — Other Ambulatory Visit: Payer: Self-pay | Admitting: Family Medicine

## 2019-09-15 DIAGNOSIS — F419 Anxiety disorder, unspecified: Secondary | ICD-10-CM

## 2019-09-16 NOTE — Telephone Encounter (Signed)
Message routed to PCP CMA  

## 2019-09-16 NOTE — Telephone Encounter (Signed)
Message sent to Dr. Jordan for review and approval. 

## 2019-09-18 NOTE — Telephone Encounter (Signed)
FYI sent to Dr. Martinique for review. Spoke with patient and she stated that she did have one refill left from Dr. Nori Riis, but hadn't used it yet because she started seeing Dr. Martinique for her anxiety and wanted her to continue medication. Patient given recommendations per Dr. Martinique about refill: "I did send a Rx that was due to fill on 08/12/19,this was not filled. Instead she got a Rx for Alprazolam from another provider, Dr Nori Riis, with a refills, it seems like she has a refill still at her pharmacy from Dr Nori Riis. Because this is a controlled medication, it should be prescribed by same provider, if she wants to continue following with Dr Riley Churches is appropriate to do so." Patient stated that she would use Rx that is left at the pharmacy from Dr. Nori Riis and then she will get further refills from Dr. Martinique.

## 2019-09-18 NOTE — Telephone Encounter (Signed)
Pt returned call to the office. Pt requests call back. °

## 2019-09-18 NOTE — Telephone Encounter (Signed)
I did send a Rx that was due to fill on 08/12/19,this was not filled. Instead she got a Rx for Alprazolam from another provider, Dr Nori Riis, with a refills, it seems like she has a refill still at her pharmacy from Dr Nori Riis. Because this is a controlled med,it should be prescribed by same provider, if she wants to continue following with Dr Riley Churches is appropriate to do so.  Thanks, BJ

## 2019-09-18 NOTE — Telephone Encounter (Signed)
Left message to return call to office concerning medication. 

## 2019-09-25 DIAGNOSIS — Z1231 Encounter for screening mammogram for malignant neoplasm of breast: Secondary | ICD-10-CM | POA: Diagnosis not present

## 2019-09-25 DIAGNOSIS — Z6821 Body mass index (BMI) 21.0-21.9, adult: Secondary | ICD-10-CM | POA: Diagnosis not present

## 2019-09-25 DIAGNOSIS — Z01419 Encounter for gynecological examination (general) (routine) without abnormal findings: Secondary | ICD-10-CM | POA: Diagnosis not present

## 2019-10-11 ENCOUNTER — Ambulatory Visit: Payer: BC Managed Care – PPO | Admitting: Family Medicine

## 2019-10-11 ENCOUNTER — Other Ambulatory Visit: Payer: Self-pay

## 2019-10-11 ENCOUNTER — Encounter: Payer: Self-pay | Admitting: Family Medicine

## 2019-10-11 VITALS — BP 124/76 | HR 60 | Temp 98.0°F | Resp 16 | Ht 62.0 in | Wt 122.0 lb

## 2019-10-11 DIAGNOSIS — R0989 Other specified symptoms and signs involving the circulatory and respiratory systems: Secondary | ICD-10-CM

## 2019-10-11 DIAGNOSIS — F419 Anxiety disorder, unspecified: Secondary | ICD-10-CM

## 2019-10-11 DIAGNOSIS — Z23 Encounter for immunization: Secondary | ICD-10-CM | POA: Diagnosis not present

## 2019-10-11 DIAGNOSIS — J989 Respiratory disorder, unspecified: Secondary | ICD-10-CM

## 2019-10-11 MED ORDER — ALPRAZOLAM 0.5 MG PO TABS: 0.5000 mg | ORAL_TABLET | Freq: Two times a day (BID) | ORAL | 1 refills | Status: DC | PRN

## 2019-10-11 MED ORDER — SERTRALINE HCL 100 MG PO TABS: 100.0000 mg | ORAL_TABLET | Freq: Every day | ORAL | 2 refills | Status: DC

## 2019-10-11 MED ORDER — ALBUTEROL SULFATE HFA 108 (90 BASE) MCG/ACT IN AERS: 2.0000 | INHALATION_SPRAY | Freq: Four times a day (QID) | RESPIRATORY_TRACT | 2 refills | Status: DC | PRN

## 2019-10-11 NOTE — Patient Instructions (Signed)
A few things to remember from today's visit:   Anxiety disorder, unspecified type  Need for influenza vaccination - Plan: Flu Vaccine QUAD 36+ mos IM  Sertraline increased from 50 mg to 100 mg daily. No changes in Xanax.  Please be sure medication list is accurate. If a new problem present, please set up appointment sooner than planned today.

## 2019-10-11 NOTE — Progress Notes (Signed)
HPI:   Christina Watts is a 58 y.o. female, who is here today for anxiety and depression follow up. She was last seen on 08/09/19. Since her last visit she has seen her gyn, Dr Nori Riis.  Anxiety and depression aggravated by the lost of her son in 04/2019.  She is on sertraline 50 mg daily and Xanax 0.5 mg twice daily as needed. She is taking Xanax 0.5 mg 1/2 tab at work prn for anxiety attacks and 1/2 at bedtime to help her sleep also prn.  She received Xanax Rx from Dr Loralee Pacas gyn, states that she decided to continue receiving Rx from me.  When she take Xanax at bedtime she sleep all night, when she does not she wakes up a few times through the night. Staying busy at work has helped. Still having crying spells and panics attacks at work, the latter one causes palpitations and sense of fear.Xanax helps, it takes about 40 min for symptom to subside after taking medication.  She denies suicidal thoughts.  She is living with her husband.  She is not allowed to see her grandson, who was taken away from her after the death of her son. She states that her grandson's mother is using drugs and does not let him stay with Ms Hodgkins.  She has good support from her other 2 children and has other grandchildren with who she spends time,this really helps her.  She has tolerated the medication well. She is meeting with counselor every 2 weeks.  She is also requesting refills for albuterol inhaler. + Smoker, she has quit a few times but for short period of times. States that she has not smoked in a few days.  Intermittent episodes of cough and wheezing,not often.  Symptoms exacerbated by weather changes. She denies fever, chills, sore throat, chest pain, dyspnea, or diaphoresis.  Review of Systems  Constitutional: Positive for fatigue. Negative for activity change, appetite change and fever.  HENT: Negative for mouth sores, nosebleeds and trouble swallowing.   Respiratory: Negative for  stridor.   Cardiovascular: Negative for leg swelling.  Gastrointestinal: Negative for abdominal pain, nausea and vomiting.       Negative for changes in bowel habits.  Neurological: Negative for syncope, weakness and headaches.  Psychiatric/Behavioral: Positive for sleep disturbance. Negative for confusion. The patient is nervous/anxious.   Rest see pertinent positives and negatives per HPI.   Current Outpatient Medications on File Prior to Visit  Medication Sig Dispense Refill  . Acetaminophen-Caffeine (EXCEDRIN TENSION HEADACHE PO) Take 1-2 tablets by mouth as needed. headache    . amLODipine (NORVASC) 5 MG tablet Take 1 tablet (5 mg total) by mouth daily. 90 tablet 3  . aspirin 81 MG tablet Take 81 mg by mouth daily.    . Butalbital-APAP-Caffeine 50-300-40 MG CAPS TAKE 1 OR 2 CAPS BY MOUTH EVERY 4 TO 6 HOURS AS NEEDED FOR HEADACHE    . calcium carbonate (OS-CAL) 600 MG TABS Take 1,200 mg by mouth 2 (two) times daily.     Marland Kitchen estradiol (ESTRACE) 2 MG tablet Take 2 mg by mouth 2 (two) times daily.     . nitroGLYCERIN (NITROSTAT) 0.4 MG SL tablet Place 1 tablet (0.4 mg total) under the tongue every 5 (five) minutes as needed for chest pain. 75 tablet 1  . omeprazole (PRILOSEC) 20 MG capsule TAKE 1 CAPSULE BY MOUTH EVERY DAY 90 capsule 1  . simvastatin (ZOCOR) 80 MG tablet Take 1 tablet (80 mg total)  by mouth daily at 6 PM. Please keep upcoming appt in December with Dr. Burt Knack before anymore refills. Thank you 90 tablet 0  . SYNTHROID 100 MCG tablet TAKE 1 TABLET (100 MCG TOTAL) BY MOUTH DAILY BEFORE BREAKFAST. 30 tablet 11  . valACYclovir (VALTREX) 1000 MG tablet     . XIIDRA 5 % SOLN INSTILL 1 DROP INTO BOTH EYES TWICE A DAY     No current facility-administered medications on file prior to visit.      Past Medical History:  Diagnosis Date  . 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 This was associated with  Subsequent to that time she has had  repeat laparoscopies for chronic, particularly right sided, pelvic pain on two occasions, one in 2000 and again in June of 2003.  At each time, pelvic adhesions were noted but no other significant pelvic pathology  . Adenomyosis    uterine adenomyosis, in 1995 total abdominal hysterectomy, right ovarian cystectomy  . Cancer ([place])    melanoma - right leg - good now  . GERD (gastroesophageal reflux disease)   . Headache    Migraines  . Hypercholesterolemia   . Hyperlipidemia   . Hypothyroidism   . Other and unspecified ovarian cysts    long history of recurring ovarian cysts  . Right bundle branch block   . Thyroid disease   . Tobacco abuse   . 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 Ulcer disease    currently asymptomatic and not on medication   Allergies  Allergen Reactions  . Promethazine Hcl Anaphylaxis, Hives and Other (See Comments)    Lungs swell and cant breathe    Social History   Socioeconomic History  . Marital status: Married    Spouse name: Not on file  . Number of children: Not on file  . Years of education: Not on file  . Highest education level: Not on file  Occupational History  . Not on file  Social Needs  . Financial resource strain: Not on file  . Food insecurity    Worry: Not on file    Inability: Not on file  . Transportation needs    Medical: Not on file    Non-medical: Not on file  Tobacco Use  . Smoking status: Current Some Day Smoker    Packs/day: 0.50    Years: 19.00    Pack years: 9.50    Types: Cigarettes    Last attempt to quit: 06/05/2017    Years since quitting: 2.3  . Smokeless tobacco: Never Used  Substance and Sexual Activity  . Alcohol use: Yes    Comment: Drinks wine occas  . Drug use: No  . Sexual activity: Yes    Birth control/protection: Surgical  Lifestyle  . Physical activity    Days per week: Not on file    Minutes per session: Not on file  . Stress: Not on file  Relationships  . Social Product manager on phone: Not on file    Gets together: Not on file    Attends religious service: Not on file    Active member of club or organization: Not on file    Attends meetings of clubs or organizations: Not on file    Relationship status: Not on file  Other Topics Concern  . Not on file  Social History Narrative   She is married with three children, and she has five sibling who are all  still living.    Vitals:   10/11/19 1203  BP: 124/76  Pulse: 60  Resp: 16  Temp: 98 F (36.7 C)  SpO2: 100%   Body mass index is 22.31 kg/m.  Physical Exam  Nursing note and vitals reviewed. Constitutional: She is oriented to person, place, and time. She appears well-developed and well-nourished. No distress.  HENT:  Head: Normocephalic.  Mouth/Throat: Oropharynx is clear and moist. Mucous membranes are dry.  Eyes: Pupils are equal, round, and reactive to light. Conjunctivae and EOM are normal.  Cardiovascular: Normal rate and regular rhythm.  No murmur heard. Respiratory: Effort normal and breath sounds normal. No respiratory distress.  Musculoskeletal:        General: No edema.  Lymphadenopathy:    She has no cervical adenopathy.  Neurological: She is alert and oriented to person, place, and time. She has normal strength. Gait normal.  Skin: Skin is warm. No erythema.  Psychiatric: Her speech is normal. Her mood appears anxious. Her affect is labile. Cognition and memory are normal. She expresses no suicidal ideation. She expresses no suicidal plans.  Well groomed,goo eye contact.    ASSESSMENT AND PLAN:  Ms. Jacora was seen today for follow-up.  Diagnoses and all orders for this visit:  Reactive airway disease that is not asthma Educated about benefits of smoking cessation as well as adverse effects of tobacco use. ?  COPD. Continue albuterol inhaler 2 puffs every 6 hours as needed. Encouraged smoking cessation.  -     albuterol (VENTOLIN HFA) 108 (90 Base) MCG/ACT inhaler; Inhale  2 puffs into the lungs every 6 (six) hours as needed for wheezing or shortness of breath.  Anxiety disorder, unspecified type Improved. She is still having some panic attacks. Sertraline increased from 50 mg to 100 mg. No changes in alprazolam. We discussed some side effects of medications. An option to consider is propranolol bid prn, she prefers to continue Xanax.  Alprazolam needs to be filled by same provider, verbal contract today. Instructed about warning signs. Continue following with counselor every 2 weeks.  -     sertraline (ZOLOFT) 100 MG tablet; Take 1 tablet (100 mg total) by mouth daily. -     ALPRAZolam (XANAX) 0.5 MG tablet; Take 1 tablet (0.5 mg total) by mouth 2 (two) times daily as needed for anxiety (No more than 45 tabs per month.).  Need for influenza vaccination -     Flu Vaccine QUAD 36+ mos IM   25 min face to face OV. > 50% was dedicated to discussion of Dx, prognosis, treatment options, and some side effects of medications.   Return in about 6 weeks (around 11/22/2019).     G. Martinique, MD  [facility]. [place] office.

## 2019-11-18 ENCOUNTER — Telehealth (INDEPENDENT_AMBULATORY_CARE_PROVIDER_SITE_OTHER): Payer: BC Managed Care – PPO | Admitting: Family Medicine

## 2019-11-18 ENCOUNTER — Other Ambulatory Visit: Payer: Self-pay

## 2019-11-18 ENCOUNTER — Encounter: Payer: Self-pay | Admitting: Family Medicine

## 2019-11-18 VITALS — Temp 98.9°F | Resp 16 | Ht 62.0 in

## 2019-11-18 DIAGNOSIS — R05 Cough: Secondary | ICD-10-CM

## 2019-11-18 DIAGNOSIS — R0989 Other specified symptoms and signs involving the circulatory and respiratory systems: Secondary | ICD-10-CM

## 2019-11-18 DIAGNOSIS — Z720 Tobacco use: Secondary | ICD-10-CM

## 2019-11-18 DIAGNOSIS — J069 Acute upper respiratory infection, unspecified: Secondary | ICD-10-CM | POA: Diagnosis not present

## 2019-11-18 DIAGNOSIS — F419 Anxiety disorder, unspecified: Secondary | ICD-10-CM | POA: Diagnosis not present

## 2019-11-18 DIAGNOSIS — J989 Respiratory disorder, unspecified: Secondary | ICD-10-CM

## 2019-11-18 DIAGNOSIS — R059 Cough, unspecified: Secondary | ICD-10-CM

## 2019-11-18 MED ORDER — BENZONATATE 100 MG PO CAPS: 200.0000 mg | ORAL_CAPSULE | Freq: Two times a day (BID) | ORAL | 0 refills | Status: AC | PRN

## 2019-11-18 NOTE — Progress Notes (Signed)
Virtual Visit via Video Note   I connected with Christina Watts on 11/18/19 by a video enabled telemedicine application and verified that I am speaking with the correct person using two identifiers.  Location patient: home Location provider:work office Persons participating in the virtual visit: patient, provider  I discussed the limitations of evaluation and management by telemedicine and the availability of in person appointments. The patient expressed understanding and agreed to proceed.   HPI: Christina Watts is a 58 yo female following on anxiety and depression. She was last seen on 10/11/19. Last visit Sertraline dose was increased from 50 mg to 100 mg daily. She has noted improvement in anxiety and depressed mood. She has not noted side effects.  She is also on Xanax 0.5 mg bid prn,taken it mainly at bedtime, it helps her sleep. She is following with counselor every 1-2 weeks.  New concerns: Yesterday she started with productive cough with greenish sputum and mild wheezing. No known sick contact or COVID-19 exposure. She has not noted fever, chills, body aches, nausea, vomiting, abdominal pain, changes in bowel habits, or skin rash. Positive for rhinorrhea, postnasal drainage, nasal congestion.  She denies hemoptysis and dyspnea. Negative for changes in the smell or taste. She was sent home from work.  She has not taking OTC medications. + Smoker.  ROS: See pertinent positives and negatives per HPI.  Past Medical History:  Diagnosis Date  . 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 This was associated with  Subsequent to that time she has had repeat laparoscopies for chronic, particularly right sided, pelvic pain on two occasions, one in 2000 and again in June of 2003.  At each time, pelvic adhesions were noted but no other significant pelvic pathology  . Adenomyosis    uterine adenomyosis, in 1995 total abdominal hysterectomy, right ovarian  cystectomy  . Cancer ([place])    melanoma - right leg - good now  . GERD (gastroesophageal reflux disease)   . Headache    Migraines  . Hypercholesterolemia   . Hyperlipidemia   . Hypothyroidism   . Other and unspecified ovarian cysts    long history of recurring ovarian cysts  . Right bundle branch block   . Thyroid disease   . Tobacco abuse   . 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 Ulcer disease    currently asymptomatic and not on medication    Past Surgical History:  Procedure Laterality Date  . ABDOMINAL HYSTERECTOMY    . APPENDECTOMY     for obliteration of the appendiceal tip  . BREAST BIOPSY  02/1992   biopsy of benign breast mass  . CARDIAC CATHETERIZATION N/A 05/14/2015   Procedure: Left Heart Cath and Coronary Angiography;  Surgeon: [name], MD;  Location: [facility];  Service: Cardiovascular;  Laterality: N/A;  . ESOPHAGOGASTRODUODENOSCOPY  12/06/2005   Normal esophagogastroduodenoscopy --  Earle Gell, M.D.  . HARDWARE REMOVAL Left 04/19/2017   Procedure: HARDWARE REMOVAL LEFT ANKLE;  Surgeon: Earlie Server, MD;  Location: [facility];  Service: Orthopedics;  Laterality: Left;  . left ankle surgery    . OVARIAN CYST REMOVAL  1995  . TOTAL ABDOMINAL HYSTERECTOMY W/ BILATERAL SALPINGOOPHORECTOMY  1995  . TOTAL THYROIDECTOMY  05/13/2004   Multiple thyroid nodules, dominant mass, left thyroid lobe -- by, Earnstine Regal, M.D  . TUBAL LIGATION  1989    Family History  Problem Relation Age of Onset  . Heart disease  Father   . Heart attack Father   . Asthma Mother   . Diabetes Mother   . Heart disease Other        fathers side has a strong history of heart disease  . Hyperlipidemia Other        strong family history of    Social History   Socioeconomic History  . Marital status: Married    Spouse name: Not on file  . Number of children: Not on file  . Years of education: Not on file  . Highest education level: Not on  file  Occupational History  . Not on file  Social Needs  . Financial resource strain: Not on file  . Food insecurity    Worry: Not on file    Inability: Not on file  . Transportation needs    Medical: Not on file    Non-medical: Not on file  Tobacco Use  . Smoking status: Current Some Day Smoker    Packs/day: 0.50    Years: 19.00    Pack years: 9.50    Types: Cigarettes    Last attempt to quit: 06/05/2017    Years since quitting: 2.4  . Smokeless tobacco: Never Used  Substance and Sexual Activity  . Alcohol use: Yes    Comment: Drinks wine occas  . Drug use: No  . Sexual activity: Yes    Birth control/protection: Surgical  Lifestyle  . Physical activity    Days per week: Not on file    Minutes per session: Not on file  . Stress: Not on file  Relationships  . Social Herbalist on phone: Not on file    Gets together: Not on file    Attends religious service: Not on file    Active member of club or organization: Not on file    Attends meetings of clubs or organizations: Not on file    Relationship status: Not on file  . Intimate partner violence    Fear of current or ex partner: Not on file    Emotionally abused: Not on file    Physically abused: Not on file    Forced sexual activity: Not on file  Other Topics Concern  . Not on file  Social History Narrative   She is married with three children, and she has five sibling who are all still living.     Current Outpatient Medications:  .  Acetaminophen-Caffeine (EXCEDRIN TENSION HEADACHE PO), Take 1-2 tablets by mouth as needed. headache, Disp: , Rfl:  .  albuterol (VENTOLIN HFA) 108 (90 Base) MCG/ACT inhaler, Inhale 2 puffs into the lungs every 6 (six) hours as needed for wheezing or shortness of breath., Disp: 18 g, Rfl: 2 .  ALPRAZolam (XANAX) 0.5 MG tablet, Take 1 tablet (0.5 mg total) by mouth 2 (two) times daily as needed for anxiety (No more than 45 tabs per month.)., Disp: 40 tablet, Rfl: 1 .   amLODipine (NORVASC) 5 MG tablet, Take 1 tablet (5 mg total) by mouth daily., Disp: 90 tablet, Rfl: 3 .  aspirin 81 MG tablet, Take 81 mg by mouth daily., Disp: , Rfl:  .  benzonatate (TESSALON) 100 MG capsule, Take 2 capsules (200 mg total) by mouth 2 (two) times daily as needed for up to 10 days., Disp: 40 capsule, Rfl: 0 .  Butalbital-APAP-Caffeine 50-300-40 MG CAPS, TAKE 1 OR 2 CAPS BY MOUTH EVERY 4 TO 6 HOURS AS NEEDED FOR HEADACHE, Disp: , Rfl:  .  calcium carbonate (OS-CAL) 600 MG TABS, Take 1,200 mg by mouth 2 (two) times daily. , Disp: , Rfl:  .  estradiol (ESTRACE) 2 MG tablet, Take 2 mg by mouth 2 (two) times daily. , Disp: , Rfl:  .  nitroGLYCERIN (NITROSTAT) 0.4 MG SL tablet, Place 1 tablet (0.4 mg total) under the tongue every 5 (five) minutes as needed for chest pain., Disp: 75 tablet, Rfl: 1 .  omeprazole (PRILOSEC) 20 MG capsule, TAKE 1 CAPSULE BY MOUTH EVERY DAY, Disp: 90 capsule, Rfl: 1 .  sertraline (ZOLOFT) 100 MG tablet, Take 1 tablet (100 mg total) by mouth daily., Disp: 30 tablet, Rfl: 2 .  simvastatin (ZOCOR) 80 MG tablet, Take 1 tablet (80 mg total) by mouth daily at 6 PM. Please keep upcoming appt in December with Dr. Burt Knack before anymore refills. Thank you, Disp: 90 tablet, Rfl: 0 .  SYNTHROID 100 MCG tablet, TAKE 1 TABLET (100 MCG TOTAL) BY MOUTH DAILY BEFORE BREAKFAST., Disp: 30 tablet, Rfl: 11 .  valACYclovir (VALTREX) 1000 MG tablet, , Disp: , Rfl:  .  XIIDRA 5 % SOLN, INSTILL 1 DROP INTO BOTH EYES TWICE A DAY, Disp: , Rfl:   EXAM:  VITALS per patient if applicable:Temp XX123456 F (37.2 C)   Resp 16 Comment: approx  Ht 5\' 2"  (1.575 m)   BMI 22.31 kg/m    GENERAL: alert, oriented, appears well and in no acute distress  HEENT: atraumatic, conjunctiva clear, no obvious abnormalities on inspection.  NECK: normal movements of the head and neck  LUNGS: on inspection no signs of respiratory distress, breathing rate appears normal, no obvious gross SOB, gasping or  wheezing A few episodes of productive cough during visit.   CV: no obvious cyanosis  Christina: moves all visible extremities without noticeable abnormality  PSYCH/NEURO: pleasant and cooperative, no obvious depression, + anxious. Speech and thought processing grossly intact  ASSESSMENT AND PLAN:  Discussed the following assessment and plan:  Reactive airway disease that is not asthma Albuterol inh 2 puff every 6 hours for a week then as needed for wheezing or shortness of breath.  For now I do not think oral prednisone is needed. Instructed to let me know if wheezing gets worse. Instructed about warning signs.  Cough - Plan: benzonatate (TESSALON) 100 MG capsule OTC plain Mucinex and benzonatate may help. Instructed about warning signs.  URI, acute Most likely viral. For now I recommend symptomatic treatment. Adequate hydration and rest. Instructed to continue monitoring for fever and for other worrisome symptoms.  COVID 19 screening test ,information about testing sites given,she will go tomorrow. Also recommend quarantine.  Anxiety disorder, unspecified type Problem improved with Sertraline 100 mg, so no changes. Continue Xanax 0.5 mg bid prn. Following with counselor. F/U in 4 months,before if needed.  Tobacco abuse disorder She has decreased smoking some. Adverse effects of tobacco use as well as benefits of smoking cessation have been discussed.  I discussed the assessment and treatment plan with the patient. She was provided an opportunity to ask questions and all were answered. She agreed with the plan and demonstrated an understanding of the instructions.   Return in about 4 months (around 03/17/2020) for anxiety/grief.    Alvah Lagrow Martinique, MD

## 2019-11-19 ENCOUNTER — Other Ambulatory Visit: Payer: Self-pay

## 2019-11-19 DIAGNOSIS — Z20822 Contact with and (suspected) exposure to covid-19: Secondary | ICD-10-CM

## 2019-11-20 ENCOUNTER — Telehealth: Payer: BC Managed Care – PPO | Admitting: Family Medicine

## 2019-11-20 LAB — NOVEL CORONAVIRUS, NAA: SARS-CoV-2, NAA: NOT DETECTED

## 2019-11-25 ENCOUNTER — Telehealth: Payer: Self-pay | Admitting: Family Medicine

## 2019-11-25 NOTE — Telephone Encounter (Signed)
Copied from [place] [id number]. Topic: General - Other >> Nov 25, 2019  8:07 AM Keene Breath wrote: Reason for CRM: Patient called to inform the nurse that the cough medicine she prescribed is not working and she cannot sleep from coughing at night.  Please call to discuss at [phone]

## 2019-11-25 NOTE — Telephone Encounter (Signed)
Patient is calling again regarding her cough.  Patient would really like something for her cough as soon as possible.

## 2019-11-26 ENCOUNTER — Other Ambulatory Visit: Payer: Self-pay

## 2019-11-26 ENCOUNTER — Telehealth (INDEPENDENT_AMBULATORY_CARE_PROVIDER_SITE_OTHER): Payer: BC Managed Care – PPO | Admitting: Family Medicine

## 2019-11-26 ENCOUNTER — Encounter: Payer: Self-pay | Admitting: Family Medicine

## 2019-11-26 ENCOUNTER — Other Ambulatory Visit: Payer: Self-pay | Admitting: Family Medicine

## 2019-11-26 VITALS — Ht 62.0 in

## 2019-11-26 DIAGNOSIS — R0989 Other specified symptoms and signs involving the circulatory and respiratory systems: Secondary | ICD-10-CM

## 2019-11-26 DIAGNOSIS — R059 Cough, unspecified: Secondary | ICD-10-CM

## 2019-11-26 DIAGNOSIS — J989 Respiratory disorder, unspecified: Secondary | ICD-10-CM

## 2019-11-26 DIAGNOSIS — R05 Cough: Secondary | ICD-10-CM

## 2019-11-26 DIAGNOSIS — Z720 Tobacco use: Secondary | ICD-10-CM | POA: Diagnosis not present

## 2019-11-26 MED ORDER — BUDESONIDE-FORMOTEROL FUMARATE 160-4.5 MCG/ACT IN AERO: 2.0000 | INHALATION_SPRAY | Freq: Two times a day (BID) | RESPIRATORY_TRACT | 3 refills | Status: DC

## 2019-11-26 MED ORDER — HYDROCODONE-HOMATROPINE 5-1.5 MG/5ML PO SYRP: 5.0000 mL | ORAL_SOLUTION | Freq: Two times a day (BID) | ORAL | 0 refills | Status: AC | PRN

## 2019-11-26 NOTE — Telephone Encounter (Signed)
Rx for a stronger cough medication was sent to her pharmacy to take at bedtime. Cough is the symptom that last longer after respiratory viral infection and in her case it may last even longer due to hx of smoking. If wheezing is persistent we can also try oral Prednisone.  Thanks, BJ

## 2019-11-26 NOTE — Progress Notes (Signed)
Virtual Visit via Video Note   I connected with Christina Watts on 11/26/19 by a video enabled telemedicine application and verified that I am speaking with the correct person using two identifiers.  Location patient: home Location provider:work office Persons participating in the virtual visit: patient, provider  I discussed the limitations of evaluation and management by telemedicine and the availability of in person appointments. The patient expressed understanding and agreed to proceed.   HPI: Christina Watts is a 58 year old female with history of GERD, tobacco use, and anxiety who is complaining about productive cough with greenish sputum, she denies hemoptysis. She was seen on 11/18/2019 when she just started respiratory symptoms. Benzonatate and symptomatic treatment was recommended. She called earlier to report that benzonatate was not helping with cough.  Negative for dyspnea, wheezing, chest pain. She is using albuterol inhaler 2 puffs every 6 hours as needed.  Coughing spells, seems to be worse at night when she is in bed, affecting her sleep.  States that she has not smoked for 3 days. Last year she was on Symbicort 160-4.5 mcg twice daily for a few months due to similar symptoms, she would like to have a prescription.  GERD: She is currently on omeprazole 20 mg daily.  She has not noted heartburn or acid reflux. She denies abdominal pain, nausea, vomiting, or changes in bowel habits.  She is checking temperature daily, no fever or chills. + Nasal congestion and rhinorrhea.  COVID 19 test is negative, 11/19/19.  ROS: See pertinent positives and negatives per HPI.  Past Medical History:  Diagnosis Date  . 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 This was associated with  Subsequent to that time she has had repeat laparoscopies for chronic, particularly right sided, pelvic pain on two occasions, one in 2000 and again in June of 2003.  At  each time, pelvic adhesions were noted but no other significant pelvic pathology  . Adenomyosis    uterine adenomyosis, in 1995 total abdominal hysterectomy, right ovarian cystectomy  . Cancer ([place])    melanoma - right leg - good now  . GERD (gastroesophageal reflux disease)   . Headache    Migraines  . Hypercholesterolemia   . Hyperlipidemia   . Hypothyroidism   . Other and unspecified ovarian cysts    long history of recurring ovarian cysts  . Right bundle branch block   . Thyroid disease   . Tobacco abuse   . 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 Ulcer disease    currently asymptomatic and not on medication    Past Surgical History:  Procedure Laterality Date  . ABDOMINAL HYSTERECTOMY    . APPENDECTOMY     for obliteration of the appendiceal tip  . BREAST BIOPSY  02/1992   biopsy of benign breast mass  . CARDIAC CATHETERIZATION N/A 05/14/2015   Procedure: Left Heart Cath and Coronary Angiography;  Surgeon: [name], MD;  Location: [facility];  Service: Cardiovascular;  Laterality: N/A;  . ESOPHAGOGASTRODUODENOSCOPY  12/06/2005   Normal esophagogastroduodenoscopy --  Earle Gell, M.D.  . HARDWARE REMOVAL Left 04/19/2017   Procedure: HARDWARE REMOVAL LEFT ANKLE;  Surgeon: Earlie Server, MD;  Location: [facility];  Service: Orthopedics;  Laterality: Left;  . left ankle surgery    . OVARIAN CYST REMOVAL  1995  . TOTAL ABDOMINAL HYSTERECTOMY W/ BILATERAL SALPINGOOPHORECTOMY  1995  . TOTAL THYROIDECTOMY  05/13/2004   Multiple thyroid nodules, dominant mass, left  thyroid lobe -- by, Earnstine Regal, M.D  . TUBAL LIGATION  1989    Family History  Problem Relation Age of Onset  . Heart disease Father   . Heart attack Father   . Asthma Mother   . Diabetes Mother   . Heart disease Other        fathers side has a strong history of heart disease  . Hyperlipidemia Other        strong family history of    Social History   Socioeconomic  History  . Marital status: Married    Spouse name: Not on file  . Number of children: Not on file  . Years of education: Not on file  . Highest education level: Not on file  Occupational History  . Not on file  Social Needs  . Financial resource strain: Not on file  . Food insecurity    Worry: Not on file    Inability: Not on file  . Transportation needs    Medical: Not on file    Non-medical: Not on file  Tobacco Use  . Smoking status: Current Some Day Smoker    Packs/day: 0.50    Years: 19.00    Pack years: 9.50    Types: Cigarettes    Last attempt to quit: 06/05/2017    Years since quitting: 2.4  . Smokeless tobacco: Never Used  Substance and Sexual Activity  . Alcohol use: Yes    Comment: Drinks wine occas  . Drug use: No  . Sexual activity: Yes    Birth control/protection: Surgical  Lifestyle  . Physical activity    Days per week: Not on file    Minutes per session: Not on file  . Stress: Not on file  Relationships  . Social Herbalist on phone: Not on file    Gets together: Not on file    Attends religious service: Not on file    Active member of club or organization: Not on file    Attends meetings of clubs or organizations: Not on file    Relationship status: Not on file  . Intimate partner violence    Fear of current or ex partner: Not on file    Emotionally abused: Not on file    Physically abused: Not on file    Forced sexual activity: Not on file  Other Topics Concern  . Not on file  Social History Narrative   She is married with three children, and she has five sibling who are all still living.    Current Outpatient Medications:  .  Acetaminophen-Caffeine (EXCEDRIN TENSION HEADACHE PO), Take 1-2 tablets by mouth as needed. headache, Disp: , Rfl:  .  albuterol (VENTOLIN HFA) 108 (90 Base) MCG/ACT inhaler, Inhale 2 puffs into the lungs every 6 (six) hours as needed for wheezing or shortness of breath., Disp: 18 g, Rfl: 2 .  ALPRAZolam  (XANAX) 0.5 MG tablet, Take 1 tablet (0.5 mg total) by mouth 2 (two) times daily as needed for anxiety (No more than 45 tabs per month.)., Disp: 40 tablet, Rfl: 1 .  amLODipine (NORVASC) 5 MG tablet, Take 1 tablet (5 mg total) by mouth daily., Disp: 90 tablet, Rfl: 3 .  aspirin 81 MG tablet, Take 81 mg by mouth daily., Disp: , Rfl:  .  benzonatate (TESSALON) 100 MG capsule, Take 2 capsules (200 mg total) by mouth 2 (two) times daily as needed for up to 10 days., Disp: 40 capsule, Rfl:  0 .  budesonide-formoterol (SYMBICORT) 160-4.5 MCG/ACT inhaler, Inhale 2 puffs into the lungs 2 (two) times daily., Disp: 1 Inhaler, Rfl: 3 .  Butalbital-APAP-Caffeine 50-300-40 MG CAPS, TAKE 1 OR 2 CAPS BY MOUTH EVERY 4 TO 6 HOURS AS NEEDED FOR HEADACHE, Disp: , Rfl:  .  calcium carbonate (OS-CAL) 600 MG TABS, Take 1,200 mg by mouth 2 (two) times daily. , Disp: , Rfl:  .  estradiol (ESTRACE) 2 MG tablet, Take 2 mg by mouth 2 (two) times daily. , Disp: , Rfl:  .  HYDROcodone-homatropine (HYCODAN) 5-1.5 MG/5ML syrup, Take 5 mLs by mouth every 12 (twelve) hours as needed for up to 10 days., Disp: 100 mL, Rfl: 0 .  nitroGLYCERIN (NITROSTAT) 0.4 MG SL tablet, Place 1 tablet (0.4 mg total) under the tongue every 5 (five) minutes as needed for chest pain., Disp: 75 tablet, Rfl: 1 .  omeprazole (PRILOSEC) 20 MG capsule, TAKE 1 CAPSULE BY MOUTH EVERY DAY, Disp: 90 capsule, Rfl: 1 .  sertraline (ZOLOFT) 100 MG tablet, Take 1 tablet (100 mg total) by mouth daily., Disp: 30 tablet, Rfl: 2 .  simvastatin (ZOCOR) 80 MG tablet, Take 1 tablet (80 mg total) by mouth daily at 6 PM. Please keep upcoming appt in December with Dr. Burt Knack before anymore refills. Thank you, Disp: 90 tablet, Rfl: 0 .  SYNTHROID 100 MCG tablet, TAKE 1 TABLET (100 MCG TOTAL) BY MOUTH DAILY BEFORE BREAKFAST., Disp: 30 tablet, Rfl: 11 .  valACYclovir (VALTREX) 1000 MG tablet, , Disp: , Rfl:  .  XIIDRA 5 % SOLN, INSTILL 1 DROP INTO BOTH EYES TWICE A DAY, Disp: ,  Rfl:   EXAM:  VITALS per patient if applicable:Ht 5\' 2"  (1.575 m)   SpO2 98%   BMI 22.31 kg/m   GENERAL: alert, oriented, appears well and in no acute distress  HEENT: atraumatic, normocephalic,conjunctiva clear, no obvious abnormalities on inspection of external nose and ears  LUNGS: on inspection no signs of respiratory distress, breathing rate appears normal, no obvious gross SOB, gasping or wheezing  CV: no obvious cyanosis  PSYCH/NEURO: pleasant and cooperative, no obvious depression or anxiety, speech and thought processing grossly intact  ASSESSMENT AND PLAN:  Discussed the following assessment and plan:  Cough Explained that cough can last a few weeks and even months. COPD is to be considered given her history of tobacco use, encouraged smoking cessation. GERD could also be a contributing factor. This morning I sent hydrocodone-homatropine to take 5 mL at bedtime.  We discussed side effects.  Reactive airway disease that is not asthma - Plan: budesonide-formoterol (SYMBICORT) 160-4.5 MCG/ACT inhaler Symbicort 160-4.5 mcg twice daily, continue albuterol inhaler 2 puffs every 4-6 hours as needed. She is not having wheezing, so we will hold on prednisone. She did have a fever or symptoms to suggest a bacterial infection, so we will hold on oral antibiotics. She was clearly instructed about warning signs.  Tobacco abuse disorder We discussed adverse effects of tobacco use as well as benefits of smoking cessation.   Letter to go back to work tomorrow will be provided.   She wants to be sure she has Xanax refills at her pharmacy.  Xanax prescription filled last on 10/18/2019 #40 tablets, 1 refill left.  I discussed the assessment and treatment plan with the patient. She was provided an opportunity to ask questions and all were answered. She agreed with the plan and demonstrated an understanding of the instructions.    Return if symptoms worsen or fail  to improve, for  Keep appt in 12/2019 for ehr CPE.    Betty Martinique, MD

## 2019-11-29 ENCOUNTER — Telehealth: Payer: Self-pay | Admitting: Family Medicine

## 2019-11-29 NOTE — Telephone Encounter (Signed)
Copied from [place] [id number]. Topic: General - Other >> Nov 29, 2019 11:30 AM Burchel, Abbi R wrote: Reason for CRM: Pt would like to return to work on Monday, please place return to work note in her 53.

## 2019-11-30 ENCOUNTER — Encounter: Payer: Self-pay | Admitting: Family Medicine

## 2019-12-02 ENCOUNTER — Encounter: Payer: Self-pay | Admitting: Family Medicine

## 2019-12-02 NOTE — Telephone Encounter (Signed)
Already completed

## 2019-12-09 ENCOUNTER — Encounter: Payer: Self-pay | Admitting: Cardiovascular Disease

## 2019-12-09 ENCOUNTER — Other Ambulatory Visit: Payer: Self-pay

## 2019-12-09 ENCOUNTER — Ambulatory Visit: Payer: BC Managed Care – PPO | Admitting: Cardiovascular Disease

## 2019-12-09 VITALS — BP 142/78 | HR 70 | Ht 62.0 in | Wt 122.0 lb

## 2019-12-09 DIAGNOSIS — E782 Mixed hyperlipidemia: Secondary | ICD-10-CM | POA: Diagnosis not present

## 2019-12-09 DIAGNOSIS — I10 Essential (primary) hypertension: Secondary | ICD-10-CM | POA: Diagnosis not present

## 2019-12-09 NOTE — Progress Notes (Signed)
Cardiology Office Note:    Date:  12/09/2019   ID:  Christina Watts, DOB [birth year], MRN [id number]  PCP:  Martinique, Betty G, MD  Cardiologist:  [name], MD  Electrophysiologist:  None   Referring MD: Martinique, Betty G, MD   Chief Complaint  Patient presents with  . Hypertension    History of Present Illness:    Christina Watts is a 58 y.o. female with a hx of hyperlipidemia and tobacco abuse, presenting for follow-up evaluation.  The patient has a strong family history of premature CAD and she underwent cardiac catheterization in the past demonstrating angiographically normal coronary arteries.  She's had a really tough year. Her 27 year old son died of medical problems. Her PCP has really helped her with counseling and pharmacotherapy. She's been doing Tourist information centre manager counseling. She had palpitations near the time of her son's death, but symptoms have improved as time has passed. No chest pain or pressure. No dyspnea or edema.    Past Medical History:  Diagnosis Date  . 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 This was associated with  Subsequent to that time she has had repeat laparoscopies for chronic, particularly right sided, pelvic pain on two occasions, one in 2000 and again in June of 2003.  At each time, pelvic adhesions were noted but no other significant pelvic pathology  . Adenomyosis    uterine adenomyosis, in 1995 total abdominal hysterectomy, right ovarian cystectomy  . Cancer ([place])    melanoma - right leg - good now  . GERD (gastroesophageal reflux disease)   . Headache    Migraines  . Hypercholesterolemia   . Hyperlipidemia   . Hypothyroidism   . Other and unspecified ovarian cysts    long history of recurring ovarian cysts  . Right bundle branch block   . Thyroid disease   . Tobacco abuse   . 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 Ulcer disease    currently asymptomatic and not on medication    Past Surgical  History:  Procedure Laterality Date  . ABDOMINAL HYSTERECTOMY    . APPENDECTOMY     for obliteration of the appendiceal tip  . BREAST BIOPSY  02/1992   biopsy of benign breast mass  . CARDIAC CATHETERIZATION N/A 05/14/2015   Procedure: Left Heart Cath and Coronary Angiography;  Surgeon: [name], MD;  Location: [facility];  Service: Cardiovascular;  Laterality: N/A;  . ESOPHAGOGASTRODUODENOSCOPY  12/06/2005   Normal esophagogastroduodenoscopy --  Earle Gell, M.D.  . HARDWARE REMOVAL Left 04/19/2017   Procedure: HARDWARE REMOVAL LEFT ANKLE;  Surgeon: Earlie Server, MD;  Location: [facility];  Service: Orthopedics;  Laterality: Left;  . left ankle surgery    . OVARIAN CYST REMOVAL  1995  . TOTAL ABDOMINAL HYSTERECTOMY W/ BILATERAL SALPINGOOPHORECTOMY  1995  . TOTAL THYROIDECTOMY  05/13/2004   Multiple thyroid nodules, dominant mass, left thyroid lobe -- by, Earnstine Regal, M.D  . TUBAL LIGATION  1989    Current Medications: Current Meds  Medication Sig  . Acetaminophen-Caffeine (EXCEDRIN TENSION HEADACHE PO) Take 1-2 tablets by mouth as needed. headache  . albuterol (VENTOLIN HFA) 108 (90 Base) MCG/ACT inhaler Inhale 2 puffs into the lungs every 6 (six) hours as needed for wheezing or shortness of breath.  . ALPRAZolam (XANAX) 0.5 MG tablet Take 1 tablet (0.5 mg total) by mouth 2 (two) times daily as needed for anxiety (No more than 45 tabs per  month.).  Marland Kitchen amLODipine (NORVASC) 5 MG tablet Take 1 tablet (5 mg total) by mouth daily.  Marland Kitchen aspirin 81 MG tablet Take 81 mg by mouth daily.  . budesonide-formoterol (SYMBICORT) 160-4.5 MCG/ACT inhaler Inhale 2 puffs into the lungs 2 (two) times daily.  . Butalbital-APAP-Caffeine 50-300-40 MG CAPS TAKE 1 OR 2 CAPS BY MOUTH EVERY 4 TO 6 HOURS AS NEEDED FOR HEADACHE  . calcium carbonate (OS-CAL) 600 MG TABS Take 1,200 mg by mouth 2 (two) times daily.   Marland Kitchen estradiol (ESTRACE) 2 MG tablet Take 2 mg by mouth 2 (two) times  daily.   . nitroGLYCERIN (NITROSTAT) 0.4 MG SL tablet Place 1 tablet (0.4 mg total) under the tongue every 5 (five) minutes as needed for chest pain.  Marland Kitchen omeprazole (PRILOSEC) 20 MG capsule TAKE 1 CAPSULE BY MOUTH EVERY DAY  . sertraline (ZOLOFT) 100 MG tablet Take 1 tablet (100 mg total) by mouth daily.  . simvastatin (ZOCOR) 80 MG tablet Take 1 tablet (80 mg total) by mouth daily at 6 PM. Please keep upcoming appt in December with Dr. Burt Knack before anymore refills. Thank you  . SYNTHROID 100 MCG tablet TAKE 1 TABLET (100 MCG TOTAL) BY MOUTH DAILY BEFORE BREAKFAST.  . valACYclovir (VALTREX) 1000 MG tablet Take 1,000 mg by mouth daily as needed (flare up).   Marland Kitchen XIIDRA 5 % SOLN INSTILL 1 DROP INTO BOTH EYES TWICE A DAY     Allergies:   Promethazine hcl   Social History   Socioeconomic History  . Marital status: Married    Spouse name: Not on file  . Number of children: Not on file  . Years of education: Not on file  . Highest education level: Not on file  Occupational History  . Not on file  Tobacco Use  . Smoking status: Current Some Day Smoker    Packs/day: 0.50    Years: 19.00    Pack years: 9.50    Types: Cigarettes    Last attempt to quit: 06/05/2017    Years since quitting: 2.5  . Smokeless tobacco: Never Used  Substance and Sexual Activity  . Alcohol use: Yes    Comment: Drinks wine occas  . Drug use: No  . Sexual activity: Yes    Birth control/protection: Surgical  Other Topics Concern  . Not on file  Social History Narrative   She is married with three children, and she has five sibling who are all still living.   Social Determinants of Health   Financial Resource Strain:   . Difficulty of Paying Living Expenses: Not on file  Food Insecurity:   . Worried About Charity fundraiser in the Last Year: Not on file  . Ran Out of Food in the Last Year: Not on file  Transportation Needs:   . Lack of Transportation (Medical): Not on file  . Lack of Transportation  (Non-Medical): Not on file  Physical Activity:   . Days of Exercise per Week: Not on file  . Minutes of Exercise per Session: Not on file  Stress:   . Feeling of Stress : Not on file  Social Connections:   . Frequency of Communication with Friends and Family: Not on file  . Frequency of Social Gatherings with Friends and Family: Not on file  . Attends Religious Services: Not on file  . Active Member of Clubs or Organizations: Not on file  . Attends Archivist Meetings: Not on file  . Marital Status: Not on file  Family History: The patient's family history includes Asthma in her mother; Diabetes in her mother; Heart attack in her father; Heart disease in her father and another family member; Hyperlipidemia in an other family member.  ROS:   Please see the history of present illness.    Positive for anxiety. All other systems reviewed and are negative.  EKGs/Labs/Other Studies Reviewed:    EKG:  EKG is ordered today.  The ekg ordered today demonstrates NSR 70 bpm, within normal limits  Recent Labs: 01/02/2019: TSH 1.46  Recent Lipid Panel    Component Value Date/Time   CHOL 169 08/21/2018 0854   TRIG 121 08/21/2018 0854   HDL 67 08/21/2018 0854   CHOLHDL 2.5 08/21/2018 0854   CHOLHDL 1.8 06/15/2016 0731   VLDL 23 06/15/2016 0731   LDLCALC 78 08/21/2018 0854    Physical Exam:    VS:  BP (!) 142/78   Pulse 70   Ht _0  (1.575 m)   Wt 122 lb (55.3 kg)   SpO2 98%   BMI 22.31 kg/m     Wt Readings from Last 3 Encounters:  12/09/19 122 lb (55.3 kg)  10/11/19 122 lb (55.3 kg)  07/12/19 120 lb 2 oz (54.5 kg)     GEN:  Well nourished, well developed in no acute distress HEENT: Normal NECK: No JVD; No carotid bruits LYMPHATICS: No lymphadenopathy CARDIAC: RRR, no murmurs, rubs, gallops RESPIRATORY:  Clear to auscultation without rales, wheezing or rhonchi  ABDOMEN: Soft, non-tender, non-distended MUSCULOSKELETAL:  No edema; No deformity  SKIN: Warm and  dry NEUROLOGIC:  Alert and oriented x 3 PSYCHIATRIC:  Normal affect   ASSESSMENT:    1. Mixed hyperlipidemia   2. Essential hypertension    PLAN:    In order of problems listed above:  1. tx'd with a statin drug, check lipids today. Check LFT's. 2. BP controlled on current Rx. Check metabolic panel today.   Medication Adjustments/Labs and Tests Ordered: Current medicines are reviewed at length with the patient today.  Concerns regarding medicines are outlined above.  Orders Placed This Encounter  Procedures  . Comp Met (CMET)  . Lipid panel  . EKG 12-Lead cs   No orders of the defined types were placed in this encounter.   Patient Instructions  Medication Instructions:  1) Start taking a multivitamin daily Otherwise, your provider recommends that you continue on your current medications as directed. Please refer to the Current Medication list given to you today.   *If you need a refill on your cardiac medications before your next appointment, please call your pharmacy*  Lab Work: TODAY: CMET, lipids If you have labs (blood work) drawn today and your tests are completely normal, you will receive your results only by: Marland Kitchen MyChart Message (if you have MyChart) OR . A paper copy in the mail If you have any lab test that is abnormal or we need to change your treatment, we will call you to review the results.  Follow-Up: At [facility], you and your health needs are our priority.  As part of our continuing mission to provide you with exceptional heart care, we have created designated Provider Care Teams.  These Care Teams include your primary Cardiologist (physician) and Advanced Practice Providers (APPs -  Physician Assistants and Nurse Practitioners) who all work together to provide you with the care you need, when you need it. Your next appointment:   12 months The format for your next appointment:   In Person Provider:  You may see [name], MD or one of the  following Advanced Practice Providers on your designated Care Team:    Richardson Dopp, PA-C  Vin [place], PA-C  Daune Perch, [place]    Signed, [name], MD  12/09/2019 1:30 PM    [place]

## 2019-12-09 NOTE — Patient Instructions (Signed)
Medication Instructions:  1) Start taking a multivitamin daily Otherwise, your provider recommends that you continue on your current medications as directed. Please refer to the Current Medication list given to you today.   *If you need a refill on your cardiac medications before your next appointment, please call your pharmacy*  Lab Work: TODAY: CMET, lipids If you have labs (blood work) drawn today and your tests are completely normal, you will receive your results only by: Marland Kitchen MyChart Message (if you have MyChart) OR . A paper copy in the mail If you have any lab test that is abnormal or we need to change your treatment, we will call you to review the results.  Follow-Up: At [facility], you and your health needs are our priority.  As part of our continuing mission to provide you with exceptional heart care, we have created designated Provider Care Teams.  These Care Teams include your primary Cardiologist (physician) and Advanced Practice Providers (APPs -  Physician Assistants and Nurse Practitioners) who all work together to provide you with the care you need, when you need it. Your next appointment:   12 months The format for your next appointment:   In Person Provider:   You may see [name], MD or one of the following Advanced Practice Providers on your designated Care Team:    Richardson Dopp, PA-C  Vin [place], [place]  Daune Perch, [place]

## 2019-12-10 ENCOUNTER — Other Ambulatory Visit: Payer: Self-pay | Admitting: Cardiovascular Disease

## 2019-12-10 LAB — COMPREHENSIVE METABOLIC PANEL
ALT: 14 IU/L (ref 0–32)
AST: 23 IU/L (ref 0–40)
Albumin/Globulin Ratio: 1.9 (ref 1.2–2.2)
Albumin: 3.9 g/dL (ref 3.8–4.9)
Alkaline Phosphatase: 57 IU/L (ref 39–117)
BUN/Creatinine Ratio: 25 — ABNORMAL HIGH (ref 9–23)
BUN: 16 mg/dL (ref 6–24)
Bilirubin Total: <0.2 mg/dL (ref 0.0–1.2)
CO2: 25 mmol/L (ref 20–29)
Calcium: 8.6 mg/dL — ABNORMAL LOW (ref 8.7–10.2)
Chloride: 99 mmol/L (ref 96–106)
Creatinine, Ser: 0.63 mg/dL (ref 0.57–1.00)
GFR calc Af Amer: 114 mL/min/{1.73_m2} (ref >59)
GFR calc non Af Amer: 98 mL/min/{1.73_m2} (ref >59)
Globulin, Total: 2.1 g/dL (ref 1.5–4.5)
Glucose: 75 mg/dL (ref 65–99)
Potassium: 4.2 mmol/L (ref 3.5–5.2)
Sodium: 138 mmol/L (ref 134–144)
Total Protein: 6 g/dL (ref 6.0–8.5)

## 2019-12-10 LAB — LIPID PANEL
Chol/HDL Ratio: 1.9 ratio (ref 0.0–4.4)
Cholesterol, Total: 169 mg/dL (ref 100–199)
HDL: 87 mg/dL (ref >39)
LDL Chol Calc (NIH): 67 mg/dL (ref 0–99)
Triglycerides: 82 mg/dL (ref 0–149)
VLDL Cholesterol Cal: 15 mg/dL (ref 5–40)

## 2019-12-12 DIAGNOSIS — H16223 Keratoconjunctivitis sicca, not specified as Sjogren's, bilateral: Secondary | ICD-10-CM | POA: Diagnosis not present

## 2020-01-07 ENCOUNTER — Encounter: Payer: Self-pay | Admitting: Family Medicine

## 2020-01-07 ENCOUNTER — Other Ambulatory Visit: Payer: Self-pay

## 2020-01-07 ENCOUNTER — Ambulatory Visit (INDEPENDENT_AMBULATORY_CARE_PROVIDER_SITE_OTHER): Payer: BC Managed Care – PPO | Admitting: Family Medicine

## 2020-01-07 VITALS — BP 136/80 | HR 77 | Temp 96.5°F | Resp 16 | Ht 62.0 in | Wt 121.0 lb

## 2020-01-07 DIAGNOSIS — F419 Anxiety disorder, unspecified: Secondary | ICD-10-CM

## 2020-01-07 DIAGNOSIS — E890 Postprocedural hypothyroidism: Secondary | ICD-10-CM

## 2020-01-07 DIAGNOSIS — Z0000 Encounter for general adult medical examination without abnormal findings: Secondary | ICD-10-CM

## 2020-01-07 DIAGNOSIS — E209 Hypoparathyroidism, unspecified: Secondary | ICD-10-CM | POA: Diagnosis not present

## 2020-01-07 DIAGNOSIS — I208 Other forms of angina pectoris: Secondary | ICD-10-CM | POA: Diagnosis not present

## 2020-01-07 LAB — VITAMIN D 25 HYDROXY (VIT D DEFICIENCY, FRACTURES): VITD: 49.14 ng/mL (ref 30.00–100.00)

## 2020-01-07 LAB — TSH: TSH: 10.27 u[IU]/mL — ABNORMAL HIGH (ref 0.35–4.50)

## 2020-01-07 NOTE — Progress Notes (Signed)
HPI:   Christina Watts is a 59 y.o. female, who is here today for her routine physical.  Last CPE: 04/03/2019  Regular exercise 3 or more time per week: Active job, walking all day. Following a healthy diet: Yes. She lives with her husband.  Chronic medical problems: Anxiety,hypothyroidism,hypoparathyroidism,CAD,HLD,tobacco use disorder,and GERD among some. She sees cardiologist regularly, Dr Burt Knack.  Pap smear: 08/2019, she folows with Dr Nail  S/P hysterectomy. Immunization History  Administered Date(s) Administered  . H1N1 11/28/2008  . Influenza, Seasonal, Injecte, Preservative Fre 10/28/2015  . Influenza,inj,Quad PF,6+ Mos 10/25/2012, 12/28/2016, 01/02/2019, 10/11/2019  . Influenza,trivalent, recombinat, inj, PF 01/23/2015  . Pneumococcal Polysaccharide-23 01/02/2019  . Tdap 07/01/2006, 12/26/2013, 01/08/2016    Mammogram: 09/27/19. Colonoscopy: 01/27/15. DEXA: 12/2017, normal.  Hep C screening: 12/2016 NR  HLD: She is on Simvastatin 80 mg daily.  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  Hypothyroidism,s/p thyroidectomy She is on Levothyroxine 100 mcg daily. Last TSH 1.46 in 12/2018.  Anxiety and grief: She is on Sertraline 100 mg daily and Alprazolam 0.5 mg bid prn. Medication is helping. She is seeing a counselor q 1-2 weeks.  Tobacco use for about 30 years. She is trying to quit. Max amount of cig, 1/2 PPD.   Review of Systems  Constitutional: Negative for appetite change, fatigue and fever.  HENT: Negative for hearing loss, mouth sores, sore throat and trouble swallowing.   Eyes: Negative for  redness and visual disturbance.  Respiratory: Negative for cough, shortness of breath and wheezing.   Cardiovascular: Negative for chest pain and leg swelling.  Gastrointestinal: Negative for abdominal pain, nausea and vomiting.       No changes in bowel habits.  Endocrine: Negative for cold intolerance, heat intolerance, polydipsia, polyphagia and polyuria.  Genitourinary: Negative for decreased urine volume, dysuria, hematuria, vaginal bleeding and vaginal discharge.  Musculoskeletal: Negative for gait problem and myalgias.  Skin: Negative for color change and rash.  Allergic/Immunologic: Positive for environmental allergies.  Neurological: Negative for seizures, syncope, weakness and headaches.  Psychiatric/Behavioral: Positive for sleep disturbance. Negative for confusion. The patient is nervous/anxious.   All other systems reviewed and are negative.   Current Outpatient Medications on File Prior to Visit  Medication Sig Dispense Refill  . Acetaminophen-Caffeine (EXCEDRIN TENSION HEADACHE PO) Take 1-2 tablets by mouth as needed. headache    . albuterol (VENTOLIN HFA) 108 (90 Base) MCG/ACT inhaler Inhale 2 puffs into the lungs every 6 (six) hours as needed for wheezing or shortness of breath. 18 g 2  . amLODipine (NORVASC) 5 MG tablet Take 1 tablet (5 mg total) by mouth daily. 90 tablet 3  . budesonide-formoterol (SYMBICORT) 160-4.5 MCG/ACT inhaler Inhale 2 puffs into the lungs 2 (two) times daily. 1 Inhaler 3  . Butalbital-APAP-Caffeine 50-300-40 MG CAPS TAKE 1 OR 2 CAPS BY MOUTH EVERY 4 TO 6 HOURS AS NEEDED FOR HEADACHE    . calcium carbonate (OS-CAL) 600 MG TABS Take 1,200 mg by mouth 2 (two) times daily.     Marland Kitchen  estradiol (ESTRACE) 2 MG tablet Take 2 mg by mouth 2 (two) times daily.     . nitroGLYCERIN (NITROSTAT) 0.4 MG SL tablet Place 1 tablet (0.4 mg total) under the tongue every 5 (five) minutes as needed for chest pain. 75 tablet 1  . omeprazole (PRILOSEC) 20 MG capsule TAKE 1  CAPSULE BY MOUTH EVERY DAY 90 capsule 1  . simvastatin (ZOCOR) 80 MG tablet TAKE 1 TABLET BY MOUTH DAILY AT 6 PM. PLEASE KEEP UPCOMING APPT IN DECEMBER WITH DR. Burt Knack BEFORE 90 tablet 3  . SYNTHROID 100 MCG tablet TAKE 1 TABLET (100 MCG TOTAL) BY MOUTH DAILY BEFORE BREAKFAST. 30 tablet 11  . valACYclovir (VALTREX) 1000 MG tablet Take 1,000 mg by mouth daily as needed (flare up).     Marland Kitchen XIIDRA 5 % SOLN INSTILL 1 DROP INTO BOTH EYES TWICE A DAY     No current facility-administered medications on file prior to visit.     Past Medical History:  Diagnosis Date  . 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 This was associated with  Subsequent to that time she has had repeat laparoscopies for chronic, particularly right sided, pelvic pain on two occasions, one in 2000 and again in June of 2003.  At each time, pelvic adhesions were noted but no other significant pelvic pathology  . Adenomyosis    uterine adenomyosis, in 1995 total abdominal hysterectomy, right ovarian cystectomy  . Cancer ([place])    melanoma - right leg - good now  . GERD (gastroesophageal reflux disease)   . Headache    Migraines  . Hypercholesterolemia   . Hyperlipidemia   . Hypothyroidism   . Other and unspecified ovarian cysts    long history of recurring ovarian cysts  . Right bundle branch block   . Thyroid disease   . Tobacco abuse   . 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 Ulcer disease    currently asymptomatic and not on medication    Past Surgical History:  Procedure Laterality Date  . ABDOMINAL HYSTERECTOMY    . APPENDECTOMY     for obliteration of the appendiceal tip  . BREAST BIOPSY  02/1992   biopsy of benign breast mass  . CARDIAC CATHETERIZATION N/A 05/14/2015   Procedure: Left Heart Cath and Coronary Angiography;  Surgeon: [name], MD;  Location: [facility];  Service: Cardiovascular;  Laterality: N/A;  . ESOPHAGOGASTRODUODENOSCOPY   12/06/2005   Normal esophagogastroduodenoscopy --  Earle Gell, M.D.  . HARDWARE REMOVAL Left 04/19/2017   Procedure: HARDWARE REMOVAL LEFT ANKLE;  Surgeon: Earlie Server, MD;  Location: [facility];  Service: Orthopedics;  Laterality: Left;  . left ankle surgery    . OVARIAN CYST REMOVAL  1995  . TOTAL ABDOMINAL HYSTERECTOMY W/ BILATERAL SALPINGOOPHORECTOMY  1995  . TOTAL THYROIDECTOMY  05/13/2004   Multiple thyroid nodules, dominant mass, left thyroid lobe -- by, Earnstine Regal, M.D  . TUBAL LIGATION  1989    Allergies  Allergen Reactions  . Promethazine Hcl Anaphylaxis, Hives and Other (See Comments)    Lungs swell and cant breathe    Family History  Problem Relation Age of Onset  . Heart disease Father   . Heart attack Father   . Asthma Mother   . Diabetes Mother   . Heart disease Other        fathers side has a strong history of heart disease  . Hyperlipidemia Other  strong family history of    Social History   Socioeconomic History  . Marital status: Married    Spouse name: Not on file  . Number of children: Not on file  . Years of education: Not on file  . Highest education level: Not on file  Occupational History  . Not on file  Tobacco Use  . Smoking status: Current Some Day Smoker    Packs/day: 0.50    Years: 19.00    Pack years: 9.50    Types: Cigarettes    Last attempt to quit: 06/05/2017    Years since quitting: 2.5  . Smokeless tobacco: Never Used  Substance and Sexual Activity  . Alcohol use: Yes    Comment: Drinks wine occas  . Drug use: No  . Sexual activity: Yes    Birth control/protection: Surgical  Other Topics Concern  . Not on file  Social History Narrative   She is married with three children, and she has five sibling who are all still living.   Social Determinants of Health   Financial Resource Strain:   . Difficulty of Paying Living Expenses: Not on file  Food Insecurity:   . Worried About Sales executive in the Last Year: Not on file  . Ran Out of Food in the Last Year: Not on file  Transportation Needs:   . Lack of Transportation (Medical): Not on file  . Lack of Transportation (Non-Medical): Not on file  Physical Activity:   . Days of Exercise per Week: Not on file  . Minutes of Exercise per Session: Not on file  Stress:   . Feeling of Stress : Not on file  Social Connections:   . Frequency of Communication with Friends and Family: Not on file  . Frequency of Social Gatherings with Friends and Family: Not on file  . Attends Religious Services: Not on file  . Active Member of Clubs or Organizations: Not on file  . Attends Archivist Meetings: Not on file  . Marital Status: Not on file    Vitals:   01/07/20 0829  BP: 136/80  Pulse: 77  Resp: 16  Temp: (!) 96.5 F (35.8 C)  SpO2: 100%   Body mass index is 22.13 kg/m.   Wt Readings from Last 3 Encounters:  01/07/20 121 lb (54.9 kg)  12/09/19 122 lb (55.3 kg)  10/11/19 122 lb (55.3 kg)   Physical Exam  Nursing note and vitals reviewed. Constitutional: She is oriented to person, place, and time. She appears well-developed and well-nourished. No distress.  HENT:  Head: Normocephalic and atraumatic.  Right Ear: Hearing, tympanic membrane, external ear and ear canal normal.  Left Ear: Hearing, tympanic membrane, external ear and ear canal normal.  Mouth/Throat: Uvula is midline, oropharynx is clear and moist and mucous membranes are normal.  Eyes: Pupils are equal, round, and reactive to light. Conjunctivae and EOM are normal.  Neck: No tracheal deviation present. No thyromegaly present.  Cardiovascular: Normal rate and regular rhythm.  No murmur heard. Pulses:      Dorsalis pedis pulses are 2+ on the right side and 2+ on the left side.  Respiratory: Effort normal and breath sounds normal. No respiratory distress.  GI: Soft. She exhibits no mass. There is no hepatomegaly. There is no abdominal tenderness.    Genitourinary:    Genitourinary Comments: Deferred to gyn.   Musculoskeletal:        General: No edema.     Comments: No  major deformity or signs of synovitis appreciated.  Lymphadenopathy:    She has no cervical adenopathy.       Right: No supraclavicular adenopathy present.       Left: No supraclavicular adenopathy present.  Neurological: She is alert and oriented to person, place, and time. She has normal strength. No cranial nerve deficit. Coordination and gait normal.  Reflex Scores:      Bicep reflexes are 2+ on the right side and 2+ on the left side.      Patellar reflexes are 2+ on the right side and 2+ on the left side. Skin: Skin is warm. No rash noted. No erythema.  Psychiatric: She has a normal mood and affect. Cognition and memory are normal.  Well groomed, good eye contact.    ASSESSMENT AND PLAN:  [name] was here today annual physical examination.  Orders Placed This Encounter  Procedures  . PTH, Intact and Calcium  . TSH  . VITAMIN D 25 Hydroxy (Vit-D Deficiency, Fractures)    Lab Results  Component Value Date   TSH 10.27 (H) 01/07/2020    Routine general medical examination at a health care facility We discussed the importance of regular physical activity and healthy diet for prevention of chronic illness and/or complications. Preventive guidelines reviewed. Vaccination up to date. Continue following with gyn for her female preventive care. Next CPE in a year.  Other forms of angina pectoris ([place]) Asymptomatic. Following with cardiologist.  Hypoparathyroidism, unspecified hypoparathyroidism type ([place]) Continue Ca++ 600 mg bid. Further recommendations according to lab results.  -     PTH, Intact and Calcium; Future -     VITAMIN D 25 Hydroxy (Vit-D Deficiency, Fractures)  Hypothyroidism, postsurgical No changes in current management, will follow labs done today and will give further recommendations accordingly.  Anxiety disorder,  unspecified type Stable. No changes in current management. Continue following with counselor. F/U in 5-6 months,before if needed.  -     ALPRAZolam (XANAX) 0.5 MG tablet; Take 1 tablet (0.5 mg total) by mouth 2 (two) times daily as needed for anxiety (No more than 45 tabs per month.).    Return in about 6 months (around 07/06/2020).   Christina Wirick G. Martinique, MD  [facility]. [place] office.

## 2020-01-07 NOTE — Patient Instructions (Addendum)
Today you have you routine preventive visit.  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 Healthy diet low in saturated (animal) fats and sweets and consisting of fresh fruits and vegetables, lean meats such as fish and white chicken and whole grains.  These are some of recommendations for screening depending of age and risk factors:   - Vaccines:  Tdap vaccine every 10 years.  Shingles vaccine recommended at age 42, could be given after 60 years of age but not sure about insurance coverage.   Pneumonia vaccines:  Prevnar 13 at 65 and Pneumovax at 63. Sometimes Pneumovax is giving earlier if history of smoking, lung disease,diabetes,kidney disease among some.    Screening for diabetes at age 24 and every 3 years.  Cervical cancer prevention:  Pap smear starts at 60 years of age and continues periodically until 60 years old in low risk women. Pap smear every 3 years between 36 and 15 years old. Pap smear every 3-5 years between women 78 and older if pap smear negative and HPV screening negative.   -Breast cancer: Mammogram: There is disagreement between experts about when to start screening in low risk asymptomatic female but recent recommendations are to start screening at 80 and not later than 60 years old , every 1-2 years and after 60 yo q 2 years. Screening is recommended until 60 years old but some women can continue screening depending of healthy issues.   Colon cancer screening: starts at 60 years old until 60 years old.  Cholesterol disorder screening:N/A  Also recommended:  1. Dental visit- Brush and  floss your teeth twice daily; visit your dentist twice a year. 2. Eye doctor- Get an eye exam at least every 2 years. 3. Helmet use- Always wear a helmet when riding a bicycle, motorcycle, rollerblading or skateboarding. 4. Safe sex- If you may be exposed to sexually transmitted infections, use a condom. 5. Seat belts- Seat belts can save your live; always wear one. 6. Smoke/Carbon Monoxide detectors- These detectors need to be installed on the appropriate level of your home. Replace batteries at least once a year. 7. Skin cancer- When out in the sun please cover up and use sunscreen 15 SPF or higher. 8. Violence- If anyone is threatening or hurting you, please tell your healthcare provider.  9. Drink alcohol in moderation- Limit alcohol intake to one drink or less per day. Never drink and drive.

## 2020-01-08 ENCOUNTER — Other Ambulatory Visit: Payer: Self-pay | Admitting: Family Medicine

## 2020-01-08 DIAGNOSIS — F419 Anxiety disorder, unspecified: Secondary | ICD-10-CM

## 2020-01-09 MED ORDER — ALPRAZOLAM 0.5 MG PO TABS: 0.5000 mg | ORAL_TABLET | Freq: Two times a day (BID) | ORAL | 2 refills | Status: DC | PRN

## 2020-01-13 MED ORDER — SYNTHROID 100 MCG PO TABS: ORAL_TABLET | ORAL | 11 refills | Status: DC

## 2020-01-20 DIAGNOSIS — L814 Other melanin hyperpigmentation: Secondary | ICD-10-CM | POA: Diagnosis not present

## 2020-01-20 DIAGNOSIS — D1801 Hemangioma of skin and subcutaneous tissue: Secondary | ICD-10-CM | POA: Diagnosis not present

## 2020-01-20 DIAGNOSIS — D225 Melanocytic nevi of trunk: Secondary | ICD-10-CM | POA: Diagnosis not present

## 2020-01-20 DIAGNOSIS — Z85820 Personal history of malignant melanoma of skin: Secondary | ICD-10-CM | POA: Diagnosis not present

## 2020-01-20 DIAGNOSIS — L820 Inflamed seborrheic keratosis: Secondary | ICD-10-CM | POA: Diagnosis not present

## 2020-01-24 DIAGNOSIS — Z20828 Contact with and (suspected) exposure to other viral communicable diseases: Secondary | ICD-10-CM | POA: Diagnosis not present

## 2020-01-27 ENCOUNTER — Telehealth: Payer: Self-pay | Admitting: Family Medicine

## 2020-01-27 DIAGNOSIS — Z20828 Contact with and (suspected) exposure to other viral communicable diseases: Secondary | ICD-10-CM | POA: Diagnosis not present

## 2020-01-27 NOTE — Telephone Encounter (Signed)
[facility] faxed request for medical records from [facility] [place] [facility] 01/27/20  Alesia Morin

## 2020-01-29 ENCOUNTER — Other Ambulatory Visit: Payer: Self-pay | Admitting: Family Medicine

## 2020-01-30 ENCOUNTER — Encounter: Payer: Self-pay | Admitting: Family Medicine

## 2020-02-09 ENCOUNTER — Other Ambulatory Visit: Payer: Self-pay | Admitting: Family Medicine

## 2020-02-27 ENCOUNTER — Other Ambulatory Visit: Payer: Self-pay

## 2020-02-28 ENCOUNTER — Encounter: Payer: Self-pay | Admitting: Family Medicine

## 2020-02-28 ENCOUNTER — Ambulatory Visit (INDEPENDENT_AMBULATORY_CARE_PROVIDER_SITE_OTHER): Payer: BC Managed Care – PPO | Admitting: Family Medicine

## 2020-02-28 VITALS — BP 126/78 | HR 79 | Resp 12 | Ht 62.0 in | Wt 126.0 lb

## 2020-02-28 DIAGNOSIS — E890 Postprocedural hypothyroidism: Secondary | ICD-10-CM | POA: Diagnosis not present

## 2020-02-28 LAB — TSH: TSH: 2.55 u[IU]/mL (ref 0.35–4.50)

## 2020-02-28 LAB — T3, FREE: T3, Free: 2.8 pg/mL (ref 2.3–4.2)

## 2020-02-28 LAB — T4, FREE: Free T4: 0.94 ng/dL (ref 0.60–1.60)

## 2020-02-28 MED ORDER — SYNTHROID 100 MCG PO TABS: 100.0000 ug | ORAL_TABLET | Freq: Every day | ORAL | 3 refills | Status: DC

## 2020-02-28 NOTE — Patient Instructions (Addendum)
A few things to remember from today's visit:   Hypothyroidism, postsurgical - Plan: T4, free, TSH, T3, free  No changes in thyroid med dose.  Please be sure medication list is accurate. If a new problem present, please set up appointment sooner than planned today.

## 2020-02-28 NOTE — Progress Notes (Signed)
Chief Complaint  Patient presents with  . Follow-up    HPI:  Christina Watts is a 59 y.o. female, who is here today for hypothyroidism follow-up. Last visit TSH was abnormal. It was recommended to adjust levothyroxine to 150 mcg 2 times per week and rest of days 100 mcg. . She realized she was taking levothyroxine with sertraline 100 mg since the latter one was prescribed.  She did not change levothyroxine dose, still taking 100 mcg daily. Now she is taking levothyroxine 100 mcg daily in the morning and 3 hours later she is taking the sertraline.  She has tolerated mediation well. Negative for worsening fatigue,fever, dysphagia,SOB, cold/heat intolerance,changes in bowel habits, or tremor.  Lab Results  Component Value Date   TSH 10.27 (H) 01/07/2020   Review of Systems  Constitutional: Positive for fatigue. Negative for activity change and appetite change.  Cardiovascular: Negative for chest pain and leg swelling.  Gastrointestinal: Negative for abdominal pain, nausea and vomiting.  Musculoskeletal: Negative for gait problem and myalgias.  Neurological: Negative for syncope, weakness and headaches.  Psychiatric/Behavioral: Negative for confusion.  Rest see pertinent positives and negatives per HPI.  Current Outpatient Medications on File Prior to Visit  Medication Sig Dispense Refill  . albuterol (VENTOLIN HFA) 108 (90 Base) MCG/ACT inhaler Inhale 2 puffs into the lungs every 6 (six) hours as needed for wheezing or shortness of breath. 18 g 2  . ALPRAZolam (XANAX) 0.5 MG tablet Take 1 tablet (0.5 mg total) by mouth 2 (two) times daily as needed for anxiety (No more than 45 tabs per month.). 40 tablet 2  . amLODipine (NORVASC) 5 MG tablet Take 1 tablet (5 mg total) by mouth daily. 90 tablet 3  . budesonide-formoterol (SYMBICORT) 160-4.5 MCG/ACT inhaler Inhale 2 puffs into the lungs 2 (two) times daily. 1 Inhaler 3  . Butalbital-APAP-Caffeine 50-300-40 MG CAPS TAKE  1 OR 2 CAPS BY MOUTH EVERY 4 TO 6 HOURS AS NEEDED FOR HEADACHE    . calcium carbonate (OS-CAL) 600 MG TABS Take 1,200 mg by mouth 2 (two) times daily.     Marland Kitchen estradiol (ESTRACE) 2 MG tablet Take 2 mg by mouth 2 (two) times daily.     . nitroGLYCERIN (NITROSTAT) 0.4 MG SL tablet Place 1 tablet (0.4 mg total) under the tongue every 5 (five) minutes as needed for chest pain. 75 tablet 1  . omeprazole (PRILOSEC) 20 MG capsule TAKE 1 CAPSULE BY MOUTH EVERY DAY 90 capsule 1  . sertraline (ZOLOFT) 100 MG tablet TAKE 1 TABLET BY MOUTH EVERY DAY 30 tablet 6  . simvastatin (ZOCOR) 80 MG tablet TAKE 1 TABLET BY MOUTH DAILY AT 6 PM. PLEASE KEEP UPCOMING APPT IN DECEMBER WITH DR. Burt Knack BEFORE 90 tablet 3  . valACYclovir (VALTREX) 1000 MG tablet Take 1,000 mg by mouth daily as needed (flare up).     Marland Kitchen XIIDRA 5 % SOLN INSTILL 1 DROP INTO BOTH EYES TWICE A DAY     No current facility-administered medications on file prior to visit.   Past Medical History:  Diagnosis Date  . 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 This was associated with  Subsequent to that time she has had repeat laparoscopies for chronic, particularly right sided, pelvic pain on two occasions, one in 2000 and again in June of 2003.  At each time, pelvic adhesions were noted but no other significant pelvic pathology  . Adenomyosis    uterine adenomyosis, in 1995  total abdominal hysterectomy, right ovarian cystectomy  . Cancer ([place])    melanoma - right leg - good now  . GERD (gastroesophageal reflux disease)   . Headache    Migraines  . Hypercholesterolemia   . Hyperlipidemia   . Hypothyroidism   . Other and unspecified ovarian cysts    long history of recurring ovarian cysts  . Right bundle branch block   . Thyroid disease   . Tobacco abuse   . 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 Ulcer disease    currently asymptomatic and not on medication   Allergies  Allergen Reactions  .  Promethazine Hcl Anaphylaxis, Hives and Other (See Comments)    Lungs swell and cant breathe   Social History   Socioeconomic History  . Marital status: Married    Spouse name: Not on file  . Number of children: Not on file  . Years of education: Not on file  . Highest education level: Not on file  Occupational History  . Not on file  Tobacco Use  . Smoking status: Current Some Day Smoker    Packs/day: 0.50    Years: 19.00    Pack years: 9.50    Types: Cigarettes    Last attempt to quit: 06/05/2017    Years since quitting: 2.7  . Smokeless tobacco: Never Used  Substance and Sexual Activity  . Alcohol use: Yes    Comment: Drinks wine occas  . Drug use: No  . Sexual activity: Yes    Birth control/protection: Surgical  Other Topics Concern  . Not on file  Social History Narrative   She is married with three children, and she has five sibling who are all still living.   Social Determinants of Health   Financial Resource Strain:   . Difficulty of Paying Living Expenses: Not on file  Food Insecurity:   . Worried About Charity fundraiser in the Last Year: Not on file  . Ran Out of Food in the Last Year: Not on file  Transportation Needs:   . Lack of Transportation (Medical): Not on file  . Lack of Transportation (Non-Medical): Not on file  Physical Activity:   . Days of Exercise per Week: Not on file  . Minutes of Exercise per Session: Not on file  Stress:   . Feeling of Stress : Not on file  Social Connections:   . Frequency of Communication with Friends and Family: Not on file  . Frequency of Social Gatherings with Friends and Family: Not on file  . Attends Religious Services: Not on file  . Active Member of Clubs or Organizations: Not on file  . Attends Archivist Meetings: Not on file  . Marital Status: Not on file   Vitals:   02/28/20 0956  BP: 126/78  Pulse: 79  Resp: 12  SpO2: 96%   Body mass index is 23.05 kg/m.   Physical Exam  Nursing  note and vitals reviewed. Constitutional: She is oriented to person, place, and time. She appears well-developed and well-nourished. No distress.  HENT:  Head: Normocephalic and atraumatic.  Eyes: Conjunctivae are normal.  Neck: No tracheal deviation present.  Cardiovascular: Normal rate and regular rhythm.  No murmur heard. Respiratory: Effort normal and breath sounds normal. No respiratory distress.  Musculoskeletal:        General: No edema.  Lymphadenopathy:    She has no cervical adenopathy.  Neurological: She is alert and oriented to person,  place, and time. She has normal strength. No cranial nerve deficit. Gait normal.  Reflex Scores:      Patellar reflexes are 2+ on the right side and 2+ on the left side. Skin: Skin is warm. No rash noted. No erythema.  Psychiatric: She has a normal mood and affect.  Well groomed, good eye contact.    ASSESSMENT AND PLAN:  Christina Watts was seen today for follow-up.  Diagnoses and all orders for this visit:  Orders Placed This Encounter  Procedures  . T4, free  . TSH  . T3, free    Lab Results  Component Value Date   TSH 2.55 02/28/2020   Hypothyroidism, postsurgical Continue levothyroxine 100 mcg daily with empty stomach. Further recommendation will be given according to TSH results.  -     SYNTHROID 100 MCG tablet; Take 1 tablet (100 mcg total) by mouth daily before breakfast. TAKE 1 TABLET (100 MCG TOTAL) BY MOUTH DAILY except take 1.5 tablets Tuesday & thursday BEFORE BREAKFAST.  Return for Keep appt in 06/2020.Marland Kitchen   Bruno Leach G. [place], MD  [facility]. [place] office.

## 2020-03-01 ENCOUNTER — Other Ambulatory Visit: Payer: Self-pay | Admitting: Cardiovascular Disease

## 2020-03-03 ENCOUNTER — Encounter: Payer: Self-pay | Admitting: Family Medicine

## 2020-04-12 ENCOUNTER — Other Ambulatory Visit: Payer: Self-pay | Admitting: Family Medicine

## 2020-04-12 DIAGNOSIS — F419 Anxiety disorder, unspecified: Secondary | ICD-10-CM

## 2020-04-15 ENCOUNTER — Encounter: Payer: Self-pay | Admitting: Family Medicine

## 2020-04-17 ENCOUNTER — Other Ambulatory Visit: Payer: Self-pay | Admitting: Family Medicine

## 2020-04-17 MED ORDER — FLUTICASONE PROPIONATE 50 MCG/ACT NA SUSP: 1.0000 | Freq: Every day | NASAL | 1 refills | Status: DC

## 2020-04-21 ENCOUNTER — Telehealth: Payer: Self-pay | Admitting: Family Medicine

## 2020-04-21 ENCOUNTER — Encounter: Payer: Self-pay | Admitting: Family Medicine

## 2020-04-21 NOTE — Telephone Encounter (Signed)
Work note sent to [employer] as requested. Patient notified.

## 2020-04-21 NOTE — Telephone Encounter (Signed)
Pt is requesting a note for work saying that her nasal spray is for her allergies and she is ok to return to work. She would like this sent to her mychart.

## 2020-04-21 NOTE — Telephone Encounter (Signed)
Okay to write note? Please advise.

## 2020-04-21 NOTE — Telephone Encounter (Signed)
It is ok to provide letter. It can be placed on my chart. Thanks, BJ

## 2020-06-02 ENCOUNTER — Other Ambulatory Visit: Payer: Self-pay | Admitting: Family Medicine

## 2020-06-04 DIAGNOSIS — M25562 Pain in left knee: Secondary | ICD-10-CM | POA: Diagnosis not present

## 2020-06-10 DIAGNOSIS — M25562 Pain in left knee: Secondary | ICD-10-CM | POA: Diagnosis not present

## 2020-06-15 DIAGNOSIS — M25562 Pain in left knee: Secondary | ICD-10-CM | POA: Diagnosis not present

## 2020-06-19 ENCOUNTER — Other Ambulatory Visit: Payer: Self-pay | Admitting: Family Medicine

## 2020-06-19 DIAGNOSIS — J989 Respiratory disorder, unspecified: Secondary | ICD-10-CM

## 2020-07-06 ENCOUNTER — Encounter: Payer: Self-pay | Admitting: Family Medicine

## 2020-07-06 ENCOUNTER — Ambulatory Visit: Payer: BC Managed Care – PPO | Admitting: Family Medicine

## 2020-07-06 ENCOUNTER — Other Ambulatory Visit: Payer: Self-pay

## 2020-07-06 VITALS — BP 132/80 | HR 80 | Resp 12 | Ht 62.0 in | Wt 126.0 lb

## 2020-07-06 DIAGNOSIS — I10 Essential (primary) hypertension: Secondary | ICD-10-CM | POA: Diagnosis not present

## 2020-07-06 DIAGNOSIS — E890 Postprocedural hypothyroidism: Secondary | ICD-10-CM

## 2020-07-06 DIAGNOSIS — F419 Anxiety disorder, unspecified: Secondary | ICD-10-CM | POA: Diagnosis not present

## 2020-07-06 DIAGNOSIS — J309 Allergic rhinitis, unspecified: Secondary | ICD-10-CM | POA: Diagnosis not present

## 2020-07-06 MED ORDER — ALPRAZOLAM 0.5 MG PO TABS: 0.5000 mg | ORAL_TABLET | Freq: Two times a day (BID) | ORAL | 2 refills | Status: DC | PRN

## 2020-07-06 MED ORDER — FLUTICASONE PROPIONATE 50 MCG/ACT NA SUSP: 1.0000 | Freq: Every day | NASAL | 1 refills | Status: DC

## 2020-07-06 MED ORDER — SERTRALINE HCL 100 MG PO TABS: 100.0000 mg | ORAL_TABLET | Freq: Every day | ORAL | 2 refills | Status: DC

## 2020-07-06 NOTE — Assessment & Plan Note (Signed)
Problem is stable. Continue sertraline 100 mg daily and alprazolam 0.5 mg 1/2 to 1 tablet daily as needed. Continue counseling every 2 weeks.

## 2020-07-06 NOTE — Assessment & Plan Note (Signed)
Problem is well controlled. Continue levothyroxine 100 mcg daily x5 days and 1-1/2 tablet Tuesdays and Thursdays. We will plan on checking TSH next visit.

## 2020-07-06 NOTE — Progress Notes (Signed)
HPI: [name] is a 59 y.o. female, who is here today for 6 months follow up.   She was last seen on 02/28/2020. Anxiety and grief reaction: Currently she is on sertraline 100 mg daily and alprazolam 0.5 mg 1/2 to 1 tablet daily as needed. She is following with counselor every 2 weeks. She is still under a great level of stress, dealing with her grandson's mother, who does not allow her to see him. Negative for suicidal thoughts. + Crying spells. She still feels the medication is helping.  Hypertension: Currently she is on amlodipine 5 mg daily. Follows with Dr. Burt Watts. Negative for unusual/frequent headache, CP, dyspnea, or edema. + Palpitation, associated with panic attacks.  Lab Results  Component Value Date   CREATININE 0.63 12/09/2019   BUN 16 12/09/2019   NA 138 12/09/2019   K 4.2 12/09/2019   CL 99 12/09/2019   CO2 25 12/09/2019   Hypothyroidism: Last visit levothyroxine dose was increased. She has tolerated medication well. Negative for abnormal weight loss, tremor, heat/cold intolerance, changes in bowel habits. Lab Results  Component Value Date   TSH 2.55 02/28/2020   Sinus congestion for the past few days, it is getting better. Left-sided maxillary pain and gum inflammation, we usually happens when she has a "sinus infection." She has taken OTC cold medications. Negative for fever, chills, sore throat,anosmia,or ageusia. No sick contact.  Review of Systems  Constitutional: Positive for fatigue. Negative for activity change and appetite change.  HENT: Positive for congestion and postnasal drip. Negative for mouth sores and nosebleeds.   Respiratory: Negative for cough and wheezing.   Cardiovascular: Negative for palpitations.  Gastrointestinal: Negative for abdominal pain, nausea and vomiting.  Genitourinary: Negative for decreased urine volume and hematuria.  Musculoskeletal: Negative for gait problem and myalgias.  Allergic/Immunologic: Positive  for environmental allergies.  Neurological: Negative for syncope, facial asymmetry and weakness.  Psychiatric/Behavioral: Positive for sleep disturbance. Negative for confusion. The patient is nervous/anxious.   Rest of ROS, see pertinent positives sand negatives in HPI  Current Outpatient Medications on File Prior to Visit  Medication Sig Dispense Refill  . albuterol (VENTOLIN HFA) 108 (90 Base) MCG/ACT inhaler Inhale 2 puffs into the lungs every 6 (six) hours as needed for wheezing or shortness of breath. 18 g 2  . amLODipine (NORVASC) 5 MG tablet TAKE 1 TABLET BY MOUTH EVERY DAY 90 tablet 2  . budesonide-formoterol (SYMBICORT) 160-4.5 MCG/ACT inhaler TAKE 2 PUFFS BY MOUTH TWICE A DAY 10.2 Inhaler 3  . Butalbital-APAP-Caffeine 50-300-40 MG CAPS TAKE 1 OR 2 CAPS BY MOUTH EVERY 4 TO 6 HOURS AS NEEDED FOR HEADACHE    . calcium carbonate (OS-CAL) 600 MG TABS Take 1,200 mg by mouth 2 (two) times daily.     Marland Kitchen estradiol (ESTRACE) 2 MG tablet Take 2 mg by mouth 2 (two) times daily.     . nitroGLYCERIN (NITROSTAT) 0.4 MG SL tablet Place 1 tablet (0.4 mg total) under the tongue every 5 (five) minutes as needed for chest pain. 75 tablet 1  . omeprazole (PRILOSEC) 20 MG capsule TAKE 1 CAPSULE BY MOUTH EVERY DAY 90 capsule 1  . simvastatin (ZOCOR) 80 MG tablet TAKE 1 TABLET BY MOUTH DAILY AT 6 PM. PLEASE KEEP UPCOMING APPT IN DECEMBER WITH DR. Burt Watts BEFORE 90 tablet 3  . SYNTHROID 100 MCG tablet Take 1 tablet (100 mcg total) by mouth daily before breakfast. TAKE 1 TABLET (100 MCG TOTAL) BY MOUTH DAILY except take 1.5  tablets Tuesday & thursday BEFORE BREAKFAST. 90 tablet 3  . valACYclovir (VALTREX) 1000 MG tablet Take 1,000 mg by mouth daily as needed (flare up).     Marland Kitchen XIIDRA 5 % SOLN INSTILL 1 DROP INTO BOTH EYES TWICE A DAY     No current facility-administered medications on file prior to visit.   Past Medical History:  Diagnosis Date  . Abscess, periappendiceal    periappendiceal adhesions  periappendiceal adhesions. This was associated with  Subsequent to that time she has had repeat laparoscopies for chronic, particularly right sided, pelvic pain on two occasions, one in 2000 and again in June of 2003.  At each time, pelvic adhesions were noted but no other significant pelvic pathology  . Adenomyosis    uterine adenomyosis, in 1995 total abdominal hysterectomy, right ovarian cystectomy  . Cancer ([place])    melanoma - right leg - good now  . GERD (gastroesophageal reflux disease)   . Headache    Migraines  . Hypercholesterolemia   . Hyperlipidemia   . Hypothyroidism   . Other and unspecified ovarian cysts    long history of recurring ovarian cysts  . Right bundle branch block   . Thyroid disease   . Tobacco abuse   . 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 Ulcer disease    currently asymptomatic and not on medication   Allergies  Allergen Reactions  . Promethazine Hcl Anaphylaxis, Hives and Other (See Comments)    Lungs swell and cant breathe    Social History   Socioeconomic History  . Marital status: Married    Spouse name: Not on file  . Number of children: Not on file  . Years of education: Not on file  . Highest education level: Not on file  Occupational History  . Not on file  Tobacco Use  . Smoking status: Current Some Day Smoker    Packs/day: 0.50    Years: 19.00    Pack years: 9.50    Types: Cigarettes    Last attempt to quit: 06/05/2017    Years since quitting: 3.0  . Smokeless tobacco: Never Used  Substance and Sexual Activity  . Alcohol use: Yes    Comment: Drinks wine occas  . Drug use: No  . Sexual activity: Yes    Birth control/protection: Surgical  Other Topics Concern  . Not on file  Social History Narrative   She is married with three children, and she has five sibling who are all still living.   Social Determinants of Health   Financial Resource Strain:   . Difficulty of Paying Living Expenses:   Food Insecurity:   .  Worried About Charity fundraiser in the Last Year:   . Arboriculturist in the Last Year:   Transportation Needs:   . Film/video editor (Medical):   Marland Kitchen Lack of Transportation (Non-Medical):   Physical Activity:   . Days of Exercise per Week:   . Minutes of Exercise per Session:   Stress:   . Feeling of Stress :   Social Connections:   . Frequency of Communication with Friends and Family:   . Frequency of Social Gatherings with Friends and Family:   . Attends Religious Services:   . Active Member of Clubs or Organizations:   . Attends Archivist Meetings:   Marland Kitchen Marital Status:    Vitals:   07/06/20 1202  BP: 132/80  Pulse: 80  Resp: 12  SpO2: 97%   Body mass index is 23.05 kg/m.  Physical Exam Vitals and nursing note reviewed.  Constitutional:      General: She is not in acute distress.    Appearance: She is well-developed and normal weight.  HENT:     Head: Normocephalic and atraumatic.     Nose: Septal deviation present. No congestion.     Right Turbinates: Enlarged.     Left Turbinates: Enlarged.     Right Sinus: No maxillary sinus tenderness or frontal sinus tenderness.     Left Sinus: No maxillary sinus tenderness or frontal sinus tenderness.     Mouth/Throat:     Mouth: Mucous membranes are moist.     Pharynx: Oropharynx is clear.  Eyes:     Conjunctiva/sclera: Conjunctivae normal.  Cardiovascular:     Rate and Rhythm: Normal rate and regular rhythm.     Pulses:          Dorsalis pedis pulses are 2+ on the right side and 2+ on the left side.     Heart sounds: No murmur heard.   Pulmonary:     Effort: Pulmonary effort is normal. No respiratory distress.     Breath sounds: Normal breath sounds.  Abdominal:     Palpations: Abdomen is soft. There is no hepatomegaly or mass.     Tenderness: There is no abdominal tenderness.  Musculoskeletal:     Right lower leg: No edema.     Left lower leg: No edema.  Lymphadenopathy:     Cervical: No cervical  adenopathy.  Skin:    General: Skin is warm.     Findings: No erythema or rash.  Neurological:     General: No focal deficit present.     Mental Status: She is alert and oriented to person, place, and time.     Cranial Nerves: No cranial nerve deficit.     Gait: Gait normal.  Psychiatric:        Mood and Affect: Mood is anxious. Affect is labile.     Comments: Well groomed, good eye contact.     ASSESSMENT AND PLAN:  [name] was seen today for 6 months follow-up.  Essential hypertension, benign BP adequately controlled. Continue amlodipine 5 mg daily and low-salt diet.  Hypothyroidism, postsurgical Problem is well controlled. Continue levothyroxine 100 mcg daily x5 days and 1-1/2 tablet Tuesdays and Thursdays. We will plan on checking TSH next visit.  Anxiety disorder Problem is stable. Continue sertraline 100 mg daily and alprazolam 0.5 mg 1/2 to 1 tablet daily as needed. Continue counseling every 2 weeks.  Allergic rhinitis I do not think antibiotic at this time, history does not suggest a bacterial etiology. Flonase intranasal spray daily as needed. Nasal saline irrigations may also help. Antihistaminics may thicken the mucus,therefore worsening symptoms.Recommend stopping OTC antihistaminics.   Return in about 6 months (around 01/13/2021).   Korby Ratay G. [place], MD  [facility]. [place] office.  A few things to remember from today's visit:  No changes today. Nurse visit in 09/2020 for vaccines.  If you need refills please call your pharmacy. Do not use My Chart to request refills or for acute issues that need immediate attention.    Please be sure medication list is accurate. If a new problem present, please set up appointment sooner than planned today.

## 2020-07-06 NOTE — Patient Instructions (Addendum)
A few things to remember from today's visit:  No changes today. Nurse visit in 09/2020 for vaccines.  If you need refills please call your pharmacy. Do not use My Chart to request refills or for acute issues that need immediate attention.    Please be sure medication list is accurate. If a new problem present, please set up appointment sooner than planned today.

## 2020-07-06 NOTE — Assessment & Plan Note (Signed)
I do not think antibiotic at this time, history does not suggest a bacterial etiology. Flonase intranasal spray daily as needed. Nasal saline irrigations may also help. Antihistaminics may thicken the mucus,therefore worsening symptoms.Recommend stopping OTC antihistaminics.

## 2020-07-06 NOTE — Assessment & Plan Note (Signed)
BP adequately controlled. Continue amlodipine 5 mg daily and low-salt diet.

## 2020-07-13 ENCOUNTER — Encounter: Payer: Self-pay | Admitting: Family Medicine

## 2020-07-14 ENCOUNTER — Encounter: Payer: Self-pay | Admitting: Family Medicine

## 2020-07-23 DIAGNOSIS — D225 Melanocytic nevi of trunk: Secondary | ICD-10-CM | POA: Diagnosis not present

## 2020-07-23 DIAGNOSIS — L814 Other melanin hyperpigmentation: Secondary | ICD-10-CM | POA: Diagnosis not present

## 2020-07-23 DIAGNOSIS — Z411 Encounter for cosmetic surgery: Secondary | ICD-10-CM | POA: Diagnosis not present

## 2020-07-23 DIAGNOSIS — Z85820 Personal history of malignant melanoma of skin: Secondary | ICD-10-CM | POA: Diagnosis not present

## 2020-07-23 DIAGNOSIS — L570 Actinic keratosis: Secondary | ICD-10-CM | POA: Diagnosis not present

## 2020-08-19 ENCOUNTER — Encounter: Payer: Self-pay | Admitting: Family Medicine

## 2020-08-21 ENCOUNTER — Encounter: Payer: Self-pay | Admitting: Family Medicine

## 2020-09-04 ENCOUNTER — Ambulatory Visit: Payer: BC Managed Care – PPO | Attending: Internal Medicine

## 2020-09-04 DIAGNOSIS — Z23 Encounter for immunization: Secondary | ICD-10-CM

## 2020-09-04 NOTE — Progress Notes (Signed)
   Covid-19 Vaccination Clinic  Name:  Maggy Wyble    MRN: [id number] DOB: January 17, 1961  09/04/2020  Ms. Laughman was observed post Covid-19 immunization for 30 minutes based on pre-vaccination screening without incident. She was provided with Vaccine Information Sheet and instruction to access the V-Safe system.   Ms. Leppanen was instructed to call 911 with any severe reactions post vaccine: Marland Kitchen Difficulty breathing  . Swelling of face and throat  . A fast heartbeat  . A bad rash all over body  . Dizziness and weakness   Immunizations Administered    Name Date Dose VIS Date Route   Pfizer COVID-19 Vaccine 09/04/2020 10:19 AM 0.3 mL 02/19/2019 Intramuscular   Manufacturer: [employer]   Lot: [id number]   [place]: [id number]

## 2020-09-10 ENCOUNTER — Encounter: Payer: Self-pay | Admitting: Family Medicine

## 2020-09-11 ENCOUNTER — Encounter: Payer: Self-pay | Admitting: Family Medicine

## 2020-09-25 ENCOUNTER — Other Ambulatory Visit (HOSPITAL_BASED_OUTPATIENT_CLINIC_OR_DEPARTMENT_OTHER): Payer: Self-pay | Admitting: Internal Medicine

## 2020-09-25 ENCOUNTER — Ambulatory Visit: Payer: BC Managed Care – PPO | Attending: Internal Medicine

## 2020-09-25 DIAGNOSIS — Z23 Encounter for immunization: Secondary | ICD-10-CM

## 2020-09-25 NOTE — Progress Notes (Signed)
   Covid-19 Vaccination Clinic  Name:  Christina Watts    MRN: [id number] DOB: [birth year]  09/25/2020  Christina Watts was observed post Covid-19 immunization for 15 minutes without incident. She was provided with Vaccine Information Sheet and instruction to access the V-Safe system. Vaccinated By: Sabra Heck.  Christina Watts was instructed to call 911 with any severe reactions post vaccine: Marland Kitchen Difficulty breathing  . Swelling of face and throat  . A fast heartbeat  . A bad rash all over body  . Dizziness and weakness   Immunizations Administered    Name Date Dose VIS Date Route   Pfizer COVID-19 Vaccine 09/25/2020 10:30 AM 0.3 mL 02/19/2019 Intramuscular   Manufacturer: [place]   Lot: [id number]   [place]: [id number]

## 2020-10-07 ENCOUNTER — Encounter: Payer: Self-pay | Admitting: Family Medicine

## 2020-10-09 ENCOUNTER — Ambulatory Visit: Payer: BC Managed Care – PPO | Admitting: Family Medicine

## 2020-10-20 DIAGNOSIS — H16223 Keratoconjunctivitis sicca, not specified as Sjogren's, bilateral: Secondary | ICD-10-CM | POA: Diagnosis not present

## 2020-11-06 ENCOUNTER — Ambulatory Visit: Payer: BC Managed Care – PPO | Admitting: Family Medicine

## 2020-11-13 ENCOUNTER — Ambulatory Visit: Payer: BC Managed Care – PPO | Admitting: Family Medicine

## 2020-11-16 ENCOUNTER — Other Ambulatory Visit: Payer: Self-pay | Admitting: Family Medicine

## 2020-12-09 ENCOUNTER — Other Ambulatory Visit: Payer: Self-pay | Admitting: Cardiovascular Disease

## 2020-12-11 ENCOUNTER — Other Ambulatory Visit: Payer: Self-pay | Admitting: Cardiovascular Disease

## 2020-12-24 DIAGNOSIS — Z1231 Encounter for screening mammogram for malignant neoplasm of breast: Secondary | ICD-10-CM | POA: Diagnosis not present

## 2020-12-24 DIAGNOSIS — Z6822 Body mass index (BMI) 22.0-22.9, adult: Secondary | ICD-10-CM | POA: Diagnosis not present

## 2020-12-24 DIAGNOSIS — Z01419 Encounter for gynecological examination (general) (routine) without abnormal findings: Secondary | ICD-10-CM | POA: Diagnosis not present

## 2020-12-25 ENCOUNTER — Encounter (HOSPITAL_BASED_OUTPATIENT_CLINIC_OR_DEPARTMENT_OTHER): Payer: Self-pay | Admitting: *Deleted

## 2020-12-25 ENCOUNTER — Other Ambulatory Visit: Payer: Self-pay

## 2020-12-25 ENCOUNTER — Emergency Department (HOSPITAL_BASED_OUTPATIENT_CLINIC_OR_DEPARTMENT_OTHER)
Admission: EM | Admit: 2020-12-25 | Discharge: 2020-12-25 | Disposition: A | Discharge status: Home / Self Care | Payer: BC Managed Care – PPO | Attending: Emergency Medicine | Admitting: Emergency Medicine

## 2020-12-25 DIAGNOSIS — Z79899 Other long term (current) drug therapy: Secondary | ICD-10-CM | POA: Insufficient documentation

## 2020-12-25 DIAGNOSIS — Z85820 Personal history of malignant melanoma of skin: Secondary | ICD-10-CM | POA: Insufficient documentation

## 2020-12-25 DIAGNOSIS — E039 Hypothyroidism, unspecified: Secondary | ICD-10-CM | POA: Insufficient documentation

## 2020-12-25 DIAGNOSIS — Z9861 Coronary angioplasty status: Secondary | ICD-10-CM | POA: Insufficient documentation

## 2020-12-25 DIAGNOSIS — R21 Rash and other nonspecific skin eruption: Secondary | ICD-10-CM | POA: Insufficient documentation

## 2020-12-25 DIAGNOSIS — F17210 Nicotine dependence, cigarettes, uncomplicated: Secondary | ICD-10-CM | POA: Insufficient documentation

## 2020-12-25 DIAGNOSIS — I10 Essential (primary) hypertension: Secondary | ICD-10-CM | POA: Diagnosis not present

## 2020-12-25 MED ORDER — VALACYCLOVIR HCL 1 G PO TABS: 1000.0000 mg | ORAL_TABLET | Freq: Three times a day (TID) | ORAL | 0 refills | Status: AC

## 2020-12-25 MED ORDER — ACETAMINOPHEN 325 MG PO TABS
650.0000 mg | ORAL_TABLET | Freq: Once | ORAL | Status: AC
  Administered 2020-12-25: 650 mg via ORAL
  Filled 2020-12-25: qty 2

## 2020-12-25 MED ORDER — GABAPENTIN 100 MG PO CAPS: 100.0000 mg | ORAL_CAPSULE | Freq: Three times a day (TID) | ORAL | 0 refills | Status: DC

## 2020-12-25 MED ORDER — HYDROCODONE-ACETAMINOPHEN 5-325 MG PO TABS: 2.0000 | ORAL_TABLET | ORAL | 0 refills | Status: DC | PRN

## 2020-12-25 MED ORDER — CEPHALEXIN 500 MG PO CAPS: 500.0000 mg | ORAL_CAPSULE | Freq: Two times a day (BID) | ORAL | 0 refills | Status: AC

## 2020-12-25 NOTE — ED Triage Notes (Signed)
Pt denies any fevers or other c/o.

## 2020-12-25 NOTE — ED Triage Notes (Signed)
Pt reports "bump" between her scapula for 2 days, itching initially, she scratched and then became painful. approx quarter sized raised pink area noted, pt states pain now radiates to both shoulders.

## 2020-12-25 NOTE — Discharge Instructions (Signed)
Take these medications as prescribed.  The Norco is for pain as well as the gabapentin.  Take as needed.  This medication may become addictive and may make you sleepy.  The valacyclovir is for the possible shingles  The Keflex is for possible bacterial infection.  If this is shingles you may notice additional rash.  This typically spreads only on one side of the body.  If you have any new or concerning symptoms please seek reevaluation

## 2020-12-25 NOTE — ED Provider Notes (Signed)
[facility] EMERGENCY DEPARTMENT Provider Note   CSN: [id number] Arrival date & time: 12/25/20  [id number]    History Chief Complaint  Patient presents with  . Rash    Christina Watts is a 59 y.o. female with no significant past medical history who presents for evaluation of rash.  Patient states 2 days ago she noted some itching and redness to the middle of her back between her shoulder blades.  States now developed into pain.  Husband states he had noted "bumps."  Has some mild surrounding erythema.  States yesterday she noted some pain when she palpates the skin on the back of her left arm states it feels like my skin is extra sensitive.  States today she noted pain to her right arm.  She denies any headache, lightheadedness, dizziness, chest pain, shortness of breath, paresthesias, weakness, difficulty with word finding, neck pain, neck stiffness, abdominal pain, diarrhea, dysuria, fever, chills.  Denies additional rating relieving factors.  Not take anything for symptoms.  Rates her pain a 6/10.  Describes as a burning sensation.  Has not received the shingles vaccine.  History obtained from patient and past medical records.  No interpreter used  HPI     Past Medical History:  Diagnosis Date  . 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 This was associated with  Subsequent to that time she has had repeat laparoscopies for chronic, particularly right sided, pelvic pain on two occasions, one in 2000 and again in June of 2003.  At each time, pelvic adhesions were noted but no other significant pelvic pathology  . Adenomyosis    uterine adenomyosis, in 1995 total abdominal hysterectomy, right ovarian cystectomy  . Cancer ([place])    melanoma - right leg - good now  . GERD (gastroesophageal reflux disease)   . Headache    Migraines  . Hypercholesterolemia   . Hyperlipidemia   . Hypothyroidism   . Other and unspecified ovarian cysts    long  history of recurring ovarian cysts  . Right bundle branch block   . Thyroid disease   . Tobacco abuse   . 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 Ulcer disease    currently asymptomatic and not on medication    Patient Active Problem List   Diagnosis Date Noted  . Essential hypertension, benign 07/06/2020  . Allergic rhinitis 07/06/2020  . Other forms of angina pectoris ([place]) 01/07/2020  . Anxiety disorder 05/24/2019  . 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 GERD (gastroesophageal reflux disease) 06/12/2017  . Hypothyroidism, postsurgical 07/25/2016  . Hypoparathyroidism ([place]) 07/25/2016  . Tobacco abuse disorder 07/25/2016  . Hypocalcemia 07/25/2016  . Chest tightness or pressure 06/06/2011  . Hyperlipemia 06/06/2011    Past Surgical History:  Procedure Laterality Date  . ABDOMINAL HYSTERECTOMY    . APPENDECTOMY     for obliteration of the appendiceal tip  . BREAST BIOPSY  02/1992   biopsy of benign breast mass  . CARDIAC CATHETERIZATION N/A 05/14/2015   Procedure: Left Heart Cath and Coronary Angiography;  Surgeon: [name], MD;  Location: [facility];  Service: Cardiovascular;  Laterality: N/A;  . ESOPHAGOGASTRODUODENOSCOPY  12/06/2005   Normal esophagogastroduodenoscopy --  Earle Gell, M.D.  . HARDWARE REMOVAL Left 04/19/2017   Procedure: HARDWARE REMOVAL LEFT ANKLE;  Surgeon: Earlie Server, MD;  Location: [facility];  Service: Orthopedics;  Laterality: Left;  . left ankle surgery    .  OVARIAN CYST REMOVAL  1995  . TOTAL ABDOMINAL HYSTERECTOMY W/ BILATERAL SALPINGOOPHORECTOMY  1995  . TOTAL THYROIDECTOMY  05/13/2004   Multiple thyroid nodules, dominant mass, left thyroid lobe -- by, Velora Heckler, M.D  . TUBAL LIGATION  1989     OB History   No obstetric history on file.     Family History  Problem Relation Age of Onset  . Heart disease Father   . Heart attack Father   . Asthma Mother   . Diabetes  Mother   . Heart disease Other        fathers side has a strong history of heart disease  . Hyperlipidemia Other        strong family history of    Social History   Tobacco Use  . Smoking status: Current Some Day Smoker    Packs/day: 0.50    Years: 19.00    Pack years: 9.50    Types: Cigarettes    Last attempt to quit: 06/05/2017    Years since quitting: 3.5  . Smokeless tobacco: Never Used  Substance Use Topics  . Alcohol use: Yes    Comment: Drinks wine occas  . Drug use: No    Home Medications Prior to Admission medications   Medication Sig Start Date End Date Taking? Authorizing Provider  cephALEXin (KEFLEX) 500 MG capsule Take 1 capsule (500 mg total) by mouth 2 (two) times daily for 5 days. 12/25/20 12/30/20 Yes Tondra Reierson A, PA-C  gabapentin (NEURONTIN) 100 MG capsule Take 1 capsule (100 mg total) by mouth 3 (three) times daily for 7 days. 12/25/20 01/01/21 Yes Darrik Richman A, PA-C  HYDROcodone-acetaminophen (NORCO/VICODIN) 5-325 MG tablet Take 2 tablets by mouth every 4 (four) hours as needed. 12/25/20  Yes Lyvonne Cassell A, PA-C  valACYclovir (VALTREX) 1000 MG tablet Take 1 tablet (1,000 mg total) by mouth 3 (three) times daily for 7 days. 12/25/20 01/01/21 Yes Markez Dowland A, PA-C  albuterol (VENTOLIN HFA) 108 (90 Base) MCG/ACT inhaler Inhale 2 puffs into the lungs every 6 (six) hours as needed for wheezing or shortness of breath. 10/11/19   [place], Betty G, MD  ALPRAZolam Prudy Feeler) 0.5 MG tablet Take 1 tablet (0.5 mg total) by mouth 2 (two) times daily as needed for anxiety (No more than 45 tabs per month.). 07/06/20   [place], Betty G, MD  amLODipine (NORVASC) 5 MG tablet Take 1 tablet (5 mg total) by mouth daily. Please keep upcoming appt in March 2022 with Dr. Excell Seltzer before anymore refills. Thank you 12/10/20   [name], MD  budesonide-formoterol [facility]) 160-4.5 MCG/ACT inhaler TAKE 2 PUFFS BY MOUTH TWICE A DAY 06/19/20   [place], Betty G, MD   Butalbital-APAP-Caffeine 50-300-40 MG CAPS TAKE 1 OR 2 CAPS BY MOUTH EVERY 4 TO 6 HOURS AS NEEDED FOR HEADACHE 06/18/19   [provider]  calcium carbonate (OS-CAL) 600 MG TABS Take 1,200 mg by mouth 2 (two) times daily.     [provider]  estradiol (ESTRACE) 2 MG tablet Take 2 mg by mouth 2 (two) times daily.  05/12/11   [provider]  fluticasone (FLONASE) 50 MCG/ACT nasal spray Place 1-2 sprays into both nostrils daily. 07/06/20   [place], Betty G, MD  nitroGLYCERIN (NITROSTAT) 0.4 MG SL tablet Place 1 tablet (0.4 mg total) under the tongue every 5 (five) minutes as needed for chest pain. 01/15/18   [name], MD  omeprazole (PRILOSEC) 20 MG capsule TAKE 1 CAPSULE BY MOUTH EVERY DAY  11/17/20   [place], Betty G, MD  sertraline (ZOLOFT) 100 MG tablet Take 1 tablet (100 mg total) by mouth daily. 07/06/20   [place], Betty G, MD  simvastatin (ZOCOR) 80 MG tablet Take 1 tablet (80 mg total) by mouth daily at 6 PM. 12/11/20   Tonny Bollmanooper, Michael, MD  SYNTHROID 100 MCG tablet Take 1 tablet (100 mcg total) by mouth daily before breakfast. TAKE 1 TABLET (100 MCG TOTAL) BY MOUTH DAILY except take 1.5 tablets Tuesday & thursday BEFORE BREAKFAST. 02/28/20   [place], Betty G, MD  XIIDRA 5 % SOLN INSTILL 1 DROP INTO BOTH EYES TWICE A DAY 06/25/19   [provider]    Allergies    Promethazine hcl  Review of Systems   Review of Systems  Constitutional: Negative.   HENT: Negative.   Respiratory: Negative.   Cardiovascular: Negative.   Gastrointestinal: Negative.   Genitourinary: Negative.   Musculoskeletal: Negative.   Skin: Positive for rash.  Neurological: Negative.   All other systems reviewed and are negative.   Physical Exam Updated Vital Signs BP (!) 154/93   Pulse 76   Temp 98.3 F (36.8 C) (Oral)   Resp 18   Ht 5\' 2"  (1.575 m)   Wt 57.2 kg   BMI 23.05 kg/m   Physical Exam Vitals and nursing note reviewed.  Constitutional:      General: She is not  in acute distress.    Appearance: She is well-developed and well-nourished. She is not ill-appearing, toxic-appearing or diaphoretic.  HENT:     Head: Normocephalic and atraumatic.     Nose: Nose normal.     Mouth/Throat:     Mouth: Mucous membranes are moist.  Eyes:     Pupils: Pupils are equal, round, and reactive to light.  Cardiovascular:     Rate and Rhythm: Normal rate.     Pulses: Normal pulses and intact distal pulses.     Heart sounds: Normal heart sounds.  Pulmonary:     Effort: Pulmonary effort is normal. No respiratory distress.     Breath sounds: Normal breath sounds.  Abdominal:     General: Bowel sounds are normal. There is no distension.     Palpations: Abdomen is soft. There is no mass.     Tenderness: There is no abdominal tenderness. There is no right CVA tenderness, left CVA tenderness, guarding or rebound.     Hernia: No hernia is present.  Musculoskeletal:        General: Normal range of motion.     Cervical back: Normal range of motion.     Comments: Compartments soft.  Moves all 4 extremities at difficulty.  No bony tenderness.  Skin:    General: Skin is warm and dry.     Capillary Refill: Capillary refill takes less than 2 seconds.     Comments: 2 cm, erythematous, vesicular lesion to midline thoracic spine at approximately T4.  Mild surrounding erythema.  No lesions to bilateral arms.  No fluctuance or induration.  No target lesions, bulla, desquamated skin.  Neurological:     General: No focal deficit present.     Mental Status: She is alert.     Cranial Nerves: Cranial nerves are intact.     Sensory: Sensation is intact.     Motor: Motor function is intact.     Coordination: Coordination is intact.     Gait: Gait is intact.     Deep Tendon Reflexes: Reflexes are normal and symmetric.  Comments: Mental Status:  Alert, oriented, thought content appropriate. Speech fluent without evidence of aphasia. Able to follow 2 step commands without difficulty.   Cranial Nerves:  II:  Peripheral visual fields grossly normal, pupils equal, round, reactive to light III,IV, VI: ptosis not present, extra-ocular motions intact bilaterally  V,VII: smile symmetric, facial light touch sensation equal VIII: hearing grossly normal bilaterally  IX,X: midline uvula rise  XI: bilateral shoulder shrug equal and strong XII: midline tongue extension  Motor:  5/5 in upper and lower extremities bilaterally including strong and equal grip strength and dorsiflexion/plantar flexion Sensory: Pinprick and light touch normal in all extremities.  Deep Tendon Reflexes: 2+ and symmetric  Cerebellar: normal finger-to-nose with bilateral upper extremities Gait: normal gait and balance CV: distal pulses palpable throughout    Psychiatric:        Mood and Affect: Mood and affect normal.       ED Results / Procedures / Treatments   Labs (all labs ordered are listed, but only abnormal results are displayed) Labs Reviewed - No data to display  EKG None  Radiology No results found.  Procedures Procedures (including critical care time)  Medications Ordered in ED Medications  acetaminophen (TYLENOL) tablet 650 mg (has no administration in time range)    ED Course  I have reviewed the triage vital signs and the nursing notes.  Pertinent labs & imaging results that were available during my care of the patient were reviewed by me and considered in my medical decision making (see chart for details).  Patient presents for evaluation of rash which began 2 days ago.  She is afebrile, nonseptic, non-ill-appearing.  Patient with vesicular, erythematous rash to midline thoracic spine.  No fluctuance or induration.  She has a nonfocal neuro exam without deficits. Question early shingles rash? Will place on meds to treat for such, Does have surrounding erythema, question early cellulitis? No exposures to plants, no recent changes in lotions, perfumes, food products.  No prior  history of allergic reactions.   Patient denies any difficulty breathing or swallowing.  Pt has a patent airway without stridor and is handling secretions without difficulty; no angioedema. No blisters, no pustules, no warmth, no draining sinus tracts, no superficial abscesses, no bullous impetigo, no desquamation, no target lesions with dusky purpura or a central bulla. No concern for SJS, TEN, TSS, tick borne illness, syphilis or other life-threatening condition.   The patient has been appropriately medically screened and/or stabilized in the ED. I have low suspicion for any other emergent medical condition which would require further screening, evaluation or treatment in the ED or require inpatient management.  Patient is hemodynamically stable and in no acute distress.  Patient able to ambulate in department prior to ED.  Evaluation does not show acute pathology that would require ongoing or additional emergent interventions while in the emergency department or further inpatient treatment.  I have discussed the diagnosis with the patient and answered all questions.  Pain is been managed while in the emergency department and patient has no further complaints prior to discharge.  Patient is comfortable with plan discussed in room and is stable for discharge at this time.  I have discussed strict return precautions for returning to the emergency department.  Patient was encouraged to follow-up with PCP/specialist refer to at discharge.    MDM Rules/Calculators/A&P  Final Clinical Impression(s) / ED Diagnoses Final diagnoses:  Rash    Rx / DC Orders ED Discharge Orders         Ordered    gabapentin (NEURONTIN) 100 MG capsule  3 times daily        12/25/20 1410    valACYclovir (VALTREX) 1000 MG tablet  3 times daily        12/25/20 1410    HYDROcodone-acetaminophen (NORCO/VICODIN) 5-325 MG tablet  Every 4 hours PRN        12/25/20 1410    cephALEXin (KEFLEX) 500 MG  capsule  2 times daily        12/25/20 1410           Anothy Bufano A, PA-C 12/25/20 1412    Quintella Reichert, MD 12/25/20 1419

## 2020-12-28 ENCOUNTER — Encounter: Payer: Self-pay | Admitting: Family Medicine

## 2020-12-28 ENCOUNTER — Telehealth (INDEPENDENT_AMBULATORY_CARE_PROVIDER_SITE_OTHER): Payer: BC Managed Care – PPO | Admitting: Family Medicine

## 2020-12-28 ENCOUNTER — Other Ambulatory Visit: Payer: Self-pay

## 2020-12-28 VITALS — Temp 98.0°F | Wt 125.0 lb

## 2020-12-28 DIAGNOSIS — R059 Cough, unspecified: Secondary | ICD-10-CM | POA: Diagnosis not present

## 2020-12-28 NOTE — Progress Notes (Signed)
Patient ID: Christina Watts, female   DOB: 1961/11/29, 60 y.o.   MRN: [id number]  This visit type was conducted due to national recommendations for restrictions regarding the COVID-19 pandemic in an effort to limit this patient's exposure and mitigate transmission in our community.   Virtual Visit via Telephone Note  I connected with Christina Watts on 12/28/20 at 10:00 AM EST by telephone and verified that I am speaking with the correct person using two identifiers.   I discussed the limitations, risks, security and privacy concerns of performing an evaluation and management service by telephone and the availability of in person appointments. I also discussed with the patient that there may be a patient responsible charge related to this service. The patient expressed understanding and agreed to proceed.  Location patient: home Location provider: work or home office Participants present for the call: patient, provider Patient did not have a visit in the prior 7 days to address this/these issue(s).   History of Present Illness: Christina Watts called with onset few days ago of cough, chills, body aches over the weekend.  She is actually on day 5 tomorrow.  She states her son who lives with her was diagnosed with Covid last week.  She is fairly certain she may have had Covid.  She did have her vaccinations back in September and October.  She has not had any dyspnea.  In fact, she states she feels better today than she did yesterday.  No nausea or vomiting.  No loss of taste or smell.  She had been seen in the ER on 12/25/2020 with rash with questionable shingles.  She had vesicular rash which has not spread any.  She was treated with Valtrex and Keflex and remains on both those medications at this time.  Does not have any associated pain at this time with the rash.  Past Medical History:  Diagnosis Date  . 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 This was associated  with  Subsequent to that time she has had repeat laparoscopies for chronic, particularly right sided, pelvic pain on two occasions, one in 2000 and again in June of 2003.  At each time, pelvic adhesions were noted but no other significant pelvic pathology  . Adenomyosis    uterine adenomyosis, in 1995 total abdominal hysterectomy, right ovarian cystectomy  . Cancer ([place])    melanoma - right leg - good now  . GERD (gastroesophageal reflux disease)   . Headache    Migraines  . Hypercholesterolemia   . Hyperlipidemia   . Hypothyroidism   . Other and unspecified ovarian cysts    long history of recurring ovarian cysts  . Right bundle branch block   . Thyroid disease   . Tobacco abuse   . 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 Ulcer disease    currently asymptomatic and not on medication   Past Surgical History:  Procedure Laterality Date  . ABDOMINAL HYSTERECTOMY    . APPENDECTOMY     for obliteration of the appendiceal tip  . BREAST BIOPSY  02/1992   biopsy of benign breast mass  . CARDIAC CATHETERIZATION N/A 05/14/2015   Procedure: Left Heart Cath and Coronary Angiography;  Surgeon: [name], MD;  Location: [facility];  Service: Cardiovascular;  Laterality: N/A;  . ESOPHAGOGASTRODUODENOSCOPY  12/06/2005   Normal esophagogastroduodenoscopy --  Earle Gell, M.D.  . HARDWARE REMOVAL Left 04/19/2017   Procedure: HARDWARE REMOVAL LEFT ANKLE;  Surgeon: Quillian Quince  Madelon Lips, MD;  Location: [facility];  Service: Orthopedics;  Laterality: Left;  . left ankle surgery    . OVARIAN CYST REMOVAL  1995  . TOTAL ABDOMINAL HYSTERECTOMY W/ BILATERAL SALPINGOOPHORECTOMY  1995  . TOTAL THYROIDECTOMY  05/13/2004   Multiple thyroid nodules, dominant mass, left thyroid lobe -- by, Velora Heckler, M.D  . TUBAL LIGATION  1989    reports that she has been smoking cigarettes. She has a 9.50 pack-year smoking history. She has never used smokeless tobacco. She reports current  alcohol use. She reports that she does not use drugs. family history includes Asthma in her mother; Diabetes in her mother; Heart attack in her father; Heart disease in her father and another family member; Hyperlipidemia in an other family member. Allergies  Allergen Reactions  . Promethazine Hcl Anaphylaxis, Hives and Other (See Comments)    Lungs swell and cant breathe      Observations/Objective: Patient sounds cheerful and well on the phone. I do not appreciate any SOB. Speech and thought processing are grossly intact. Patient reported vitals:  Assessment and Plan:  Cough.  She states she is on day 4 of respiratory symptoms which are relatively mild.  Positive exposure to Covid as above.  Patient is in no distress and is improving already  -Recommend observation and plenty of fluids and rest -We reviewed latest CDC guidelines for quarantine.  She will remain quarantined today and after 5 full days strict mask use for the next 5 days.  Follow-up for any dyspnea or other concerns  Follow Up Instructions:  -As above   [id number] 5-10 [id number] 11-20 [id number] 21-30 I did not refer this patient for an OV in the next 24 hours for this/these issue(s).  I discussed the assessment and treatment plan with the patient. The patient was provided an opportunity to ask questions and all were answered. The patient agreed with the plan and demonstrated an understanding of the instructions.   The patient was advised to call back or seek an in-person evaluation if the symptoms worsen or if the condition fails to improve as anticipated.  I provided 12 minutes of non-face-to-face time during this encounter.   Evelena Peat, MD

## 2021-01-06 DIAGNOSIS — Z1152 Encounter for screening for COVID-19: Secondary | ICD-10-CM | POA: Diagnosis not present

## 2021-01-09 ENCOUNTER — Other Ambulatory Visit: Payer: Self-pay | Admitting: Family Medicine

## 2021-01-09 DIAGNOSIS — J989 Respiratory disorder, unspecified: Secondary | ICD-10-CM

## 2021-01-09 DIAGNOSIS — F419 Anxiety disorder, unspecified: Secondary | ICD-10-CM

## 2021-01-13 ENCOUNTER — Ambulatory Visit: Payer: BC Managed Care – PPO | Admitting: Family Medicine

## 2021-01-13 ENCOUNTER — Other Ambulatory Visit: Payer: Self-pay

## 2021-01-13 ENCOUNTER — Encounter: Payer: BC Managed Care – PPO | Admitting: Family Medicine

## 2021-01-13 ENCOUNTER — Encounter: Payer: Self-pay | Admitting: Family Medicine

## 2021-01-13 VITALS — BP 120/70 | HR 90 | Resp 16 | Ht 62.0 in | Wt 127.4 lb

## 2021-01-13 DIAGNOSIS — Z23 Encounter for immunization: Secondary | ICD-10-CM

## 2021-01-13 DIAGNOSIS — J989 Respiratory disorder, unspecified: Secondary | ICD-10-CM | POA: Diagnosis not present

## 2021-01-13 DIAGNOSIS — F419 Anxiety disorder, unspecified: Secondary | ICD-10-CM | POA: Diagnosis not present

## 2021-01-13 DIAGNOSIS — J0100 Acute maxillary sinusitis, unspecified: Secondary | ICD-10-CM | POA: Diagnosis not present

## 2021-01-13 MED ORDER — SERTRALINE HCL 100 MG PO TABS
100.0000 mg | ORAL_TABLET | Freq: Every day | ORAL | 2 refills | Status: DC
Start: 2021-01-13 — End: 2021-10-29

## 2021-01-13 MED ORDER — ALPRAZOLAM 0.5 MG PO TABS: 0.2500 mg | ORAL_TABLET | Freq: Every day | ORAL | 3 refills | Status: DC | PRN

## 2021-01-13 MED ORDER — AMOXICILLIN-POT CLAVULANATE 875-125 MG PO TABS: 1.0000 | ORAL_TABLET | Freq: Two times a day (BID) | ORAL | 0 refills | Status: AC

## 2021-01-13 NOTE — Patient Instructions (Signed)
A few things to remember from today's visit:  If you need refills please call your pharmacy. Do not use My Chart to request refills or for acute issues that need immediate attention.   Flonase in nose daily for a few days. Saline nasal saline irrigations as needed. Xanax to continue daily as needed.  Plain mucinex. If not resolve, we will need facial CT.  Please be sure medication list is accurate. If a new problem present, please set up appointment sooner than planned today.

## 2021-01-13 NOTE — Progress Notes (Signed)
HPI: [name] is a 60 y.o. female, who is here today for 6 months follow up.   She was last seen on 07/06/20. Anxiety: She is on Sertraline 100 mg daily and Alprazolam 0.5 mg. She is taking Alprazolam 0.5 mg 1/2 tab daily as needed and sometimes she needs an extra 1/2 tab depending on level of stress. Medications are still helping. She is under some stress, fighting custody/visitation rights with her grandson's mother,preparing on going to court. Negative for suicidal thoughts. She is doing counseling, now monthly.  Upper respiratory symptoms for about 8-10 days. Getting worse. "Bad" headache, frontal.  No associated visual changes or focal neurologic deficit. Hx of sinus problems, Z-Pak has helped in the past.  Left-sided facial pain. Nasal congestion and rhinorrhea. Low grade fever a few days ago x 1 day, 100 F. A week of mildly productive cough and occasional wheezing. No hemoptysis. + Tobacco use, decreasing among of cigaret.  Negative for SOB and CP.  Allergic rhinitis, she has used Flonase nasal spray before.  According to pt, she was sent home from work and not allow to go back until seen and started on treatment. COVID 19 vaccination completed.  She had a negative COVID 18  Husband was sick with similar symptoms 2 weeks ago.  Review of Systems  Constitutional: Positive for fatigue. Negative for chills.  HENT: Negative for ear pain, mouth sores, nosebleeds and sore throat.   Gastrointestinal: Negative for abdominal pain, nausea and vomiting.       No changes in bowel habits.  Musculoskeletal: Negative for gait problem and myalgias.  Skin: Negative for rash.  Allergic/Immunologic: Positive for environmental allergies.  Neurological: Negative for syncope, facial asymmetry and weakness.  Hematological: Negative for adenopathy. Does not bruise/bleed easily.  Rest of ROS, see pertinent positives sand negatives in HPI  Current Outpatient Medications on  File Prior to Visit  Medication Sig Dispense Refill  . albuterol (VENTOLIN HFA) 108 (90 Base) MCG/ACT inhaler TAKE 2 PUFFS BY MOUTH EVERY 6 HOURS AS NEEDED FOR WHEEZE OR SHORTNESS OF BREATH 18 each 2  . amLODipine (NORVASC) 5 MG tablet Take 1 tablet (5 mg total) by mouth daily. Please keep upcoming appt in March 2022 with Dr. Excell Seltzer before anymore refills. Thank you 90 tablet 0  . budesonide-formoterol (SYMBICORT) 160-4.5 MCG/ACT inhaler TAKE 2 PUFFS BY MOUTH TWICE A DAY 10.2 Inhaler 3  . Butalbital-APAP-Caffeine 50-300-40 MG CAPS TAKE 1 OR 2 CAPS BY MOUTH EVERY 4 TO 6 HOURS AS NEEDED FOR HEADACHE    . calcium carbonate (OS-CAL) 600 MG TABS Take 1,200 mg by mouth 2 (two) times daily.    Marland Kitchen estradiol (ESTRACE) 2 MG tablet Take 2 mg by mouth 2 (two) times daily.     . fluticasone (FLONASE) 50 MCG/ACT nasal spray Place 1-2 sprays into both nostrils daily. 16 g 1  . HYDROcodone-acetaminophen (NORCO/VICODIN) 5-325 MG tablet Take 2 tablets by mouth every 4 (four) hours as needed. 10 tablet 0  . nitroGLYCERIN (NITROSTAT) 0.4 MG SL tablet Place 1 tablet (0.4 mg total) under the tongue every 5 (five) minutes as needed for chest pain. 75 tablet 1  . omeprazole (PRILOSEC) 20 MG capsule TAKE 1 CAPSULE BY MOUTH EVERY DAY 90 capsule 1  . simvastatin (ZOCOR) 80 MG tablet Take 1 tablet (80 mg total) by mouth daily at 6 PM. 30 tablet 1  . SYNTHROID 100 MCG tablet Take 1 tablet (100 mcg total) by mouth daily before breakfast. TAKE 1  TABLET (100 MCG TOTAL) BY MOUTH DAILY except take 1.5 tablets Tuesday & thursday BEFORE BREAKFAST. 90 tablet 3  . XIIDRA 5 % SOLN INSTILL 1 DROP INTO BOTH EYES TWICE A DAY    . gabapentin (NEURONTIN) 100 MG capsule Take 1 capsule (100 mg total) by mouth 3 (three) times daily for 7 days. 21 capsule 0   No current facility-administered medications on file prior to visit.    Past Medical History:  Diagnosis Date  . 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 This was associated with  Subsequent to that time she has had repeat laparoscopies for chronic, particularly right sided, pelvic pain on two occasions, one in 2000 and again in June of 2003.  At each time, pelvic adhesions were noted but no other significant pelvic pathology  . Adenomyosis    uterine adenomyosis, in 1995 total abdominal hysterectomy, right ovarian cystectomy  . Cancer ([place])    melanoma - right leg - good now  . GERD (gastroesophageal reflux disease)   . Headache    Migraines  . Hypercholesterolemia   . Hyperlipidemia   . Hypothyroidism   . Other and unspecified ovarian cysts    long history of recurring ovarian cysts  . Right bundle branch block   . Thyroid disease   . Tobacco abuse   . 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 Ulcer disease    currently asymptomatic and not on medication   Allergies  Allergen Reactions  . Promethazine Hcl Anaphylaxis, Hives and Other (See Comments)    Lungs swell and cant breathe    Social History   Socioeconomic History  . Marital status: Married    Spouse name: Not on file  . Number of children: Not on file  . Years of education: Not on file  . Highest education level: Not on file  Occupational History  . Not on file  Tobacco Use  . Smoking status: Current Some Day Smoker    Packs/day: 0.50    Years: 19.00    Pack years: 9.50    Types: Cigarettes    Last attempt to quit: 06/05/2017    Years since quitting: 3.6  . Smokeless tobacco: Never Used  Substance and Sexual Activity  . Alcohol use: Yes    Comment: Drinks wine occas  . Drug use: No  . Sexual activity: Yes    Birth control/protection: Surgical  Other Topics Concern  . Not on file  Social History Narrative   She is married with three children, and she has five sibling who are all still living.   Social Determinants of Health   Financial Resource Strain: Not on file  Food Insecurity: Not on file  Transportation Needs: Not on file   Physical Activity: Not on file  Stress: Not on file  Social Connections: Not on file    Vitals:   01/13/21 1550  BP: 120/70  Pulse: 90  Resp: 16  SpO2: 96%   Body mass index is 23.3 kg/m.  Physical Exam Vitals and nursing note reviewed.  Constitutional:      General: She is not in acute distress.    Appearance: She is well-developed and normal weight. She is not ill-appearing.  HENT:     Head: Normocephalic and atraumatic.     Right Ear: Tympanic membrane, ear canal and external ear normal.     Left Ear: Tympanic membrane, ear canal and external ear normal.     Nose:  Rhinorrhea present. No congestion.     Right Sinus: No maxillary sinus tenderness or frontal sinus tenderness.     Left Sinus: Maxillary sinus tenderness and frontal sinus tenderness present.     Mouth/Throat:     Mouth: Oropharynx is clear and moist and mucous membranes are normal. Mucous membranes are moist.     Pharynx: Oropharynx is clear.  Eyes:     Conjunctiva/sclera: Conjunctivae normal.  Cardiovascular:     Rate and Rhythm: Normal rate and regular rhythm.     Heart sounds: No murmur heard.   Pulmonary:     Effort: Pulmonary effort is normal. No respiratory distress.     Breath sounds: Normal breath sounds. No stridor.  Lymphadenopathy:     Head:     Right side of head: No submandibular adenopathy.     Left side of head: No submandibular adenopathy.     Cervical: No cervical adenopathy.  Skin:    General: Skin is warm.     Findings: No erythema or rash.  Neurological:     General: No focal deficit present.     Mental Status: She is alert and oriented to person, place, and time.     Deep Tendon Reflexes: Strength normal.  Psychiatric:        Mood and Affect: Mood is anxious.     Comments: Well groomed, good eye contact.   ASSESSMENT AND PLAN:  [name] was seen today for 6 months follow-up.  Orders Placed This Encounter  Procedures  . Flu Vaccine QUAD 36+ mos IM   Acute  non-recurrent maxillary sinusitis We discussed Dx, possible etiologies (including viral and allergic), and treatment options. Explained that Z-Pak is not the best option and given her hx of arrhythmias, I do not recommend it. She agrees with Augmentin x 10 days. Some side effects discussed. Flonase nasal spray for a few days and nasal saline irrigations also recommended.  If not better sinus CT needs to be considered.  -     amoxicillin-clavulanate (AUGMENTIN) 875-125 MG tablet; Take 1 tablet by mouth 2 (two) times daily for 10 days.  Reactive airway disease without asthma Today lung auscultation is negative. Encouraged smoking cessation. Albuterol inh 2 puff q 4-6 hours prn.  Anxiety disorder, unspecified type Problem is stable. Continue Xanax 0.5 mg 1/2-1 tab daily and Sertraline 100 mg daily. Counseling monthly.  -     sertraline (ZOLOFT) 100 MG tablet; Take 1 tablet (100 mg total) by mouth daily. -     ALPRAZolam (XANAX) 0.5 MG tablet; Take 0.5-1 tablets (0.25-0.5 mg total) by mouth daily as needed for anxiety.  Need for influenza vaccination -     Flu Vaccine QUAD 36+ mos IM   Return in about 6 months (around 07/13/2021) for anxiety..   Passion Lavin G. Martinique, MD  [facility]. [place] office.   A few things to remember from today's visit:  If you need refills please call your pharmacy. Do not use My Chart to request refills or for acute issues that need immediate attention.   Flonase in nose daily for a few days. Saline nasal saline irrigations as needed. Xanax to continue daily as needed.  Plain mucinex. If not resolve, we will need facial CT.  Please be sure medication list is accurate. If a new problem present, please set up appointment sooner than planned today.

## 2021-01-14 ENCOUNTER — Encounter: Payer: Self-pay | Admitting: Family Medicine

## 2021-01-25 ENCOUNTER — Other Ambulatory Visit: Payer: Self-pay

## 2021-01-25 DIAGNOSIS — L814 Other melanin hyperpigmentation: Secondary | ICD-10-CM | POA: Diagnosis not present

## 2021-01-25 DIAGNOSIS — D225 Melanocytic nevi of trunk: Secondary | ICD-10-CM | POA: Diagnosis not present

## 2021-01-25 DIAGNOSIS — L570 Actinic keratosis: Secondary | ICD-10-CM | POA: Diagnosis not present

## 2021-01-25 DIAGNOSIS — Z85820 Personal history of malignant melanoma of skin: Secondary | ICD-10-CM | POA: Diagnosis not present

## 2021-01-25 DIAGNOSIS — D1801 Hemangioma of skin and subcutaneous tissue: Secondary | ICD-10-CM | POA: Diagnosis not present

## 2021-01-25 NOTE — Progress Notes (Signed)
HPI: Christina Watts is a 60 y.o. female, who is here today for her routine physical.  Last CPE: 01/07/20.  Regular exercise 3 or more time per week: She does not exercise regularly but she is very active at work. She is also back bowling weekends, getting ready for a tournament later this month. Following a healthy diet: Yes. She lives with her husband.  Chronic medical problems: Anxiety,HTN.tobacco use,hypothyroidism,hypoparathyroidism, and allergies among some.  Pap smear:  S/P hysterectomy.  Follows with gyn regularly, Dr Nail.  Immunization History  Administered Date(s) Administered  . H1N1 11/28/2008  . Influenza, Seasonal, Injecte, Preservative Fre 10/28/2015  . Influenza,inj,Quad PF,6+ Mos 10/25/2012, 12/28/2016, 01/02/2019, 10/11/2019, 01/13/2021  . Influenza,trivalent, recombinat, inj, PF 01/23/2015  . PFIZER(Purple Top)SARS-COV-2 Vaccination 09/04/2020, 09/25/2020  . Pneumococcal Polysaccharide-23 01/02/2019  . Tdap 07/01/2006, 12/26/2013, 01/08/2016   Mammogram: 12/25/20 at her gyn's office. Colonoscopy: 06/25/11, due in 03/2021. DEXA: 12/2017 normal. Hep C screening: 12/2016 NR  HTN: On Amlodipine 5 mg daily.  HLD: She is on Simvastatin 80 mg daily. Follows annually with cardiologist, Dr Burt Knack.  Lab Results  Component Value Date   CHOL 169 12/09/2019   HDL 87 12/09/2019   LDLCALC 67 12/09/2019   TRIG 82 12/09/2019   CHOLHDL 1.9 12/09/2019   Hypoparathyroidism:Takes Ca++ supplementation.  Lab Results  Component Value Date   PTH 18 01/02/2019   CALCIUM 8.6 (L) 12/09/2019   CAION 4.9 07/25/2016   PHOS 3.5 07/25/2016   Hypothyroidism: She is on Synthroid 100 mcg daily.  Lab Results  Component Value Date   TSH 2.55 02/28/2020   Right ear hearing loss. Still having frontal pressure headache. Completed abx treatment. No fever or chills.  Review of Systems  Constitutional: Positive for fatigue. Negative for appetite change and fever.  HENT:  Positive for congestion and sinus pressure. Negative for hearing loss, mouth sores and sore throat.   Eyes: Negative for redness and visual disturbance.  Respiratory: Negative for cough, shortness of breath and wheezing.   Cardiovascular: Negative for chest pain and leg swelling.  Gastrointestinal: Negative for abdominal pain, nausea and vomiting.       No changes in bowel habits.  Endocrine: Negative for cold intolerance, heat intolerance, polydipsia, polyphagia and polyuria.  Genitourinary: Negative for decreased urine volume, dysuria, hematuria, vaginal bleeding and vaginal discharge.  Musculoskeletal: Negative for gait problem and myalgias.  Skin: Negative for color change and rash.  Allergic/Immunologic: Positive for environmental allergies.  Neurological: Negative for syncope, weakness and headaches.  Hematological: Negative for adenopathy. Does not bruise/bleed easily.  Psychiatric/Behavioral: Negative for confusion. The patient is nervous/anxious.   All other systems reviewed and are negative.  Current Outpatient Medications on File Prior to Visit  Medication Sig Dispense Refill  . albuterol (VENTOLIN HFA) 108 (90 Base) MCG/ACT inhaler TAKE 2 PUFFS BY MOUTH EVERY 6 HOURS AS NEEDED FOR WHEEZE OR SHORTNESS OF BREATH 18 each 2  . ALPRAZolam (XANAX) 0.5 MG tablet Take 0.5-1 tablets (0.25-0.5 mg total) by mouth daily as needed for anxiety. 30 tablet 3  . amLODipine (NORVASC) 5 MG tablet Take 1 tablet (5 mg total) by mouth daily. Please keep upcoming appt in March 2022 with Dr. Burt Knack before anymore refills. Thank you 90 tablet 0  . budesonide-formoterol (SYMBICORT) 160-4.5 MCG/ACT inhaler TAKE 2 PUFFS BY MOUTH TWICE A DAY 10.2 Inhaler 3  . Butalbital-APAP-Caffeine 50-300-40 MG CAPS TAKE 1 OR 2 CAPS BY MOUTH EVERY 4 TO 6 HOURS AS NEEDED FOR HEADACHE    .  calcium carbonate (OS-CAL) 600 MG TABS Take 1,200 mg by mouth 2 (two) times daily.    Marland Kitchen estradiol (ESTRACE) 2 MG tablet Take 2 mg by mouth  2 (two) times daily.     . fluticasone (FLONASE) 50 MCG/ACT nasal spray Place 1-2 sprays into both nostrils daily. 16 g 1  . nitroGLYCERIN (NITROSTAT) 0.4 MG SL tablet Place 1 tablet (0.4 mg total) under the tongue every 5 (five) minutes as needed for chest pain. 75 tablet 1  . omeprazole (PRILOSEC) 20 MG capsule TAKE 1 CAPSULE BY MOUTH EVERY DAY 90 capsule 1  . sertraline (ZOLOFT) 100 MG tablet Take 1 tablet (100 mg total) by mouth daily. 90 tablet 2  . simvastatin (ZOCOR) 80 MG tablet Take 1 tablet (80 mg total) by mouth daily at 6 PM. 30 tablet 1  . SYNTHROID 100 MCG tablet Take 1 tablet (100 mcg total) by mouth daily before breakfast. TAKE 1 TABLET (100 MCG TOTAL) BY MOUTH DAILY except take 1.5 tablets Tuesday & thursday BEFORE BREAKFAST. 90 tablet 3  . XIIDRA 5 % SOLN INSTILL 1 DROP INTO BOTH EYES TWICE A DAY     No current facility-administered medications on file prior to visit.   Past Medical History:  Diagnosis Date  . 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 This was associated with  Subsequent to that time she has had repeat laparoscopies for chronic, particularly right sided, pelvic pain on two occasions, one in 2000 and again in June of 2003.  At each time, pelvic adhesions were noted but no other significant pelvic pathology  . Adenomyosis    uterine adenomyosis, in 1995 total abdominal hysterectomy, right ovarian cystectomy  . Cancer ([place])    melanoma - right leg - good now  . GERD (gastroesophageal reflux disease)   . Headache    Migraines  . Hypercholesterolemia   . Hyperlipidemia   . Hypothyroidism   . Other and unspecified ovarian cysts    long history of recurring ovarian cysts  . Right bundle branch block   . Thyroid disease   . Tobacco abuse   . 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 Ulcer disease    currently asymptomatic and not on medication    Past Surgical History:  Procedure Laterality Date  . ABDOMINAL  HYSTERECTOMY    . APPENDECTOMY     for obliteration of the appendiceal tip  . BREAST BIOPSY  02/1992   biopsy of benign breast mass  . CARDIAC CATHETERIZATION N/A 05/14/2015   Procedure: Left Heart Cath and Coronary Angiography;  Surgeon: [name], MD;  Location: [facility];  Service: Cardiovascular;  Laterality: N/A;  . ESOPHAGOGASTRODUODENOSCOPY  12/06/2005   Normal esophagogastroduodenoscopy --  Earle Gell, M.D.  . HARDWARE REMOVAL Left 04/19/2017   Procedure: HARDWARE REMOVAL LEFT ANKLE;  Surgeon: Earlie Server, MD;  Location: [facility];  Service: Orthopedics;  Laterality: Left;  . left ankle surgery    . OVARIAN CYST REMOVAL  1995  . TOTAL ABDOMINAL HYSTERECTOMY W/ BILATERAL SALPINGOOPHORECTOMY  1995  . TOTAL THYROIDECTOMY  05/13/2004   Multiple thyroid nodules, dominant mass, left thyroid lobe -- by, Earnstine Regal, M.D  . TUBAL LIGATION  1989    Allergies  Allergen Reactions  . Promethazine Hcl Anaphylaxis, Hives and Other (See Comments)    Lungs swell and cant breathe    Family History  Problem Relation Age of Onset  . Heart disease Father   .  Heart attack Father   . Asthma Mother   . Diabetes Mother   . Heart disease Other        fathers side has a strong history of heart disease  . Hyperlipidemia Other        strong family history of    Social History   Socioeconomic History  . Marital status: Married    Spouse name: Not on file  . Number of children: Not on file  . Years of education: Not on file  . Highest education level: Not on file  Occupational History  . Not on file  Tobacco Use  . Smoking status: Current Some Day Smoker    Packs/day: 0.50    Years: 19.00    Pack years: 9.50    Types: Cigarettes    Last attempt to quit: 06/05/2017    Years since quitting: 3.6  . Smokeless tobacco: Never Used  Substance and Sexual Activity  . Alcohol use: Yes    Comment: Drinks wine occas  . Drug use: No  . Sexual activity: Yes     Birth control/protection: Surgical  Other Topics Concern  . Not on file  Social History Narrative   She is married with three children, and she has five sibling who are all still living.   Social Determinants of Health   Financial Resource Strain: Not on file  Food Insecurity: Not on file  Transportation Needs: Not on file  Physical Activity: Not on file  Stress: Not on file  Social Connections: Not on file    Vitals:   01/26/21 0810  BP: 128/70  Pulse: 79  Resp: 12  Temp: 98.2 F (36.8 C)  SpO2: 93%   Body mass index is 23.23 kg/m.  Wt Readings from Last 3 Encounters:  01/26/21 127 lb (57.6 kg)  01/13/21 127 lb 6 oz (57.8 kg)  12/28/20 125 lb (56.7 kg)   Physical Exam Vitals and nursing note reviewed.  Constitutional:      General: She is not in acute distress.    Appearance: She is well-developed.  HENT:     Head: Normocephalic and atraumatic.     Right Ear: Ear canal and external ear normal. Decreased hearing noted. Tympanic membrane is bulging. Tympanic membrane is not erythematous.     Left Ear: Hearing, tympanic membrane, ear canal and external ear normal.     Nose:     Right Sinus: Maxillary sinus tenderness and frontal sinus tenderness present.     Left Sinus: Maxillary sinus tenderness and frontal sinus tenderness present.     Mouth/Throat:     Mouth: Oropharynx is clear and moist and mucous membranes are normal. Mucous membranes are moist.     Pharynx: Oropharynx is clear. Uvula midline.  Eyes:     Extraocular Movements: Extraocular movements intact and EOM normal.     Conjunctiva/sclera: Conjunctivae normal.     Pupils: Pupils are equal, round, and reactive to light.  Neck:     Thyroid: No thyromegaly.     Trachea: No tracheal deviation.  Cardiovascular:     Rate and Rhythm: Normal rate and regular rhythm.     Pulses:          Dorsalis pedis pulses are 2+ on the right side and 2+ on the left side.     Heart sounds: No murmur  heard.   Pulmonary:     Effort: Pulmonary effort is normal. No respiratory distress.     Breath sounds: Normal breath sounds.  Chest:  Breasts:     Right: No supraclavicular adenopathy.     Left: No supraclavicular adenopathy.    Abdominal:     Palpations: Abdomen is soft. There is no hepatomegaly or mass.     Tenderness: There is no abdominal tenderness.  Genitourinary:    Comments: Deferred to gyn. Musculoskeletal:        General: No edema.     Comments: No signs of synovitis appreciated.  Lymphadenopathy:     Cervical: No cervical adenopathy.     Upper Body:     Right upper body: No supraclavicular adenopathy.     Left upper body: No supraclavicular adenopathy.  Skin:    General: Skin is warm.     Findings: No erythema or rash.  Neurological:     General: No focal deficit present.     Mental Status: She is alert and oriented to person, place, and time.     Cranial Nerves: No cranial nerve deficit.     Coordination: Coordination normal.     Gait: Gait normal.     Deep Tendon Reflexes: Strength normal.     Reflex Scores:      Bicep reflexes are 2+ on the right side and 2+ on the left side.      Patellar reflexes are 2+ on the right side and 2+ on the left side. Psychiatric:        Mood and Affect: Mood and affect normal.     Comments: Well groomed, good eye contact.   ASSESSMENT AND PLAN:  Ms. Carmenlita Mallory was here today annual physical examination.  Orders Placed This Encounter  Procedures  . Parathyroid hormone, intact (no Ca)  . Phosphorus  . VITAMIN D 25 Hydroxy (Vit-D Deficiency, Fractures)  . TSH  . Comprehensive metabolic panel  . Lipid panel  . Ambulatory referral to ENT   Lab Results  Component Value Date   TSH 2.50 01/26/2021   Lab Results  Component Value Date   CHOL 155 01/26/2021   HDL 68.10 01/26/2021   LDLCALC 66 01/26/2021   TRIG 107.0 01/26/2021   CHOLHDL 2 01/26/2021   Lab Results  Component Value Date   CREATININE 0.62  01/26/2021   BUN 19 01/26/2021   NA 138 01/26/2021   K 4.4 01/26/2021   CL 101 01/26/2021   CO2 32 01/26/2021   Lab Results  Component Value Date   ALT 13 01/26/2021   AST 20 01/26/2021   ALKPHOS 51 01/26/2021   BILITOT 0.3 01/26/2021   Routine general medical examination at a health care facility We discussed the importance of regular physical activity and healthy diet for prevention of chronic illness and/or complications. Preventive guidelines reviewed. Vaccination up to date. Continue female preventive care with gyn. Ca++ and vit D supplementation to continue. Next CPE in a year.  Hypothyroidism, postsurgical Continue Synthroid 100 mcg daily.   Hypoparathyroidism, unspecified hypoparathyroidism type ([place]) Problem has been stable. No changes in Ca++ su[plementation. Further recommendations according to lab results.  Mixed hyperlipidemia She agrees with decreasing dose of Simvastatin from 80 mg to 40 mg. Further recommendations according to FLP result.  Conductive hearing loss of right ear, unspecified hearing status on contralateral side Abnormal hearing screening. ? Eustachian tube dysfunction. ENT appt will be arranged.  Hearing Screening   125Hz  250Hz  500Hz  1000Hz  2000Hz  3000Hz  4000Hz  6000Hz  8000Hz   Right ear:   Fail Fail Fail  Fail    Left ear:   [place]  Pain of maxillary sinus Could be related to allergies, in which case abx treatment will not help. ENT referral placed. Smoking cessation will help.  Screening for endocrine, metabolic and immunity disorder -     Comprehensive metabolic panel  Return in 6 months (on 07/26/2021) for anxiety.   Deniesha Stenglein G. [place], MD  [facility]. [place] office.   Today you have you routine preventive visit. A few things to remember from today's visit:  Routine general medical examination at a health care facility  Hypothyroidism, postsurgical  Hypoparathyroidism, unspecified  hypoparathyroidism type ([place]) - Plan: Parathyroid hormone, intact (no Ca), Phosphorus, VITAMIN D 25 Hydroxy (Vit-D Deficiency, Fractures), TSH  Mixed hyperlipidemia - Plan: Lipid panel  Conductive hearing loss of right ear, unspecified hearing status on contralateral side - Plan: Ambulatory referral to ENT  Pain of maxillary sinus - Plan: Ambulatory referral to ENT  Screening for endocrine, metabolic and immunity disorder - Plan: Comprehensive metabolic panel  If you need refills please call your pharmacy. Do not use My Chart to request refills or for acute issues that need immediate attention.   Recommend decreasing Simvastatin from 80 mg to 40 mg. Smoking cessation.  Please be sure medication list is accurate. If a new problem present, please set up appointment sooner than planned today.  At least 150 minutes of moderate exercise per week, daily brisk walking for 15-30 min is a good exercise option. Healthy diet low in saturated (animal) fats and sweets and consisting of fresh fruits and vegetables, lean meats such as fish and white chicken and whole grains.  These are some of recommendations for screening depending of age and risk factors:  - Vaccines:  Tdap vaccine every 10 years.  Shingles vaccine recommended at age 23, could be given after 61 years of age but not sure about insurance coverage.   Pneumonia vaccines: Pneumovax at 72. Sometimes Pneumovax is giving earlier if history of smoking, lung disease,diabetes,kidney disease among some.  Screening for diabetes at age 24 and every 3 years.  Cervical cancer prevention:  Pap smear starts at 61 years of age and continues periodically until 62 years old in low risk women. Pap smear every 3 years between 75 and 43 years old. Pap smear every 3-5 years between women 94 and older if pap smear negative and HPV screening negative.   -Breast cancer: Mammogram: There is disagreement between experts about when to start screening in  low risk asymptomatic female but recent recommendations are to start screening at 53 and not later than 61 years old , every 1-2 years and after 61 yo q 2 years. Screening is recommended until 61 years old but some women can continue screening depending of healthy issues.  Colon cancer screening: Has been recently changed to 61 yo. Insurance may not cover until you are 61 years old. Screening is recommended until 61 years old.  Cholesterol disorder screening at age 69 and every 3 years. N/A  Also recommended:  1. Dental visit- Brush and floss your teeth twice daily; visit your dentist twice a year. 2. Eye doctor- Get an eye exam at least every 2 years. 3. Helmet use- Always wear a helmet when riding a bicycle, motorcycle, rollerblading or skateboarding. 4. Safe sex- If you may be exposed to sexually transmitted infections, use a condom. 5. Seat belts- Seat belts can save your live; always wear one. 6. Smoke/Carbon Monoxide detectors- These detectors need to be installed on the appropriate level of your home. Replace batteries at least  once a year. 7. Skin cancer- When out in the sun please cover up and use sunscreen 15 SPF or higher. 8. Violence- If anyone is threatening or hurting you, please tell your healthcare provider.  9. Drink alcohol in moderation- Limit alcohol intake to one drink or less per day. Never drink and drive. 10. Calcium supplementation 1000 to 1200 mg daily, ideally through your diet.  Vitamin D supplementation 800 units daily.

## 2021-01-26 ENCOUNTER — Ambulatory Visit (INDEPENDENT_AMBULATORY_CARE_PROVIDER_SITE_OTHER): Payer: BC Managed Care – PPO | Admitting: Family Medicine

## 2021-01-26 ENCOUNTER — Encounter: Payer: Self-pay | Admitting: Family Medicine

## 2021-01-26 VITALS — BP 128/70 | HR 79 | Temp 98.2°F | Resp 12 | Ht 62.0 in | Wt 127.0 lb

## 2021-01-26 DIAGNOSIS — J3489 Other specified disorders of nose and nasal sinuses: Secondary | ICD-10-CM

## 2021-01-26 DIAGNOSIS — E209 Hypoparathyroidism, unspecified: Secondary | ICD-10-CM | POA: Diagnosis not present

## 2021-01-26 DIAGNOSIS — Z0000 Encounter for general adult medical examination without abnormal findings: Secondary | ICD-10-CM | POA: Diagnosis not present

## 2021-01-26 DIAGNOSIS — E782 Mixed hyperlipidemia: Secondary | ICD-10-CM

## 2021-01-26 DIAGNOSIS — Z1329 Encounter for screening for other suspected endocrine disorder: Secondary | ICD-10-CM | POA: Diagnosis not present

## 2021-01-26 DIAGNOSIS — Z130 Encounter for screening for diseases of the blood and blood-forming organs and certain disorders involving the immune mechanism: Secondary | ICD-10-CM | POA: Diagnosis not present

## 2021-01-26 DIAGNOSIS — E890 Postprocedural hypothyroidism: Secondary | ICD-10-CM

## 2021-01-26 DIAGNOSIS — Z13228 Encounter for screening for other metabolic disorders: Secondary | ICD-10-CM | POA: Diagnosis not present

## 2021-01-26 DIAGNOSIS — H9011 Conductive hearing loss, unilateral, right ear, with unrestricted hearing on the contralateral side: Secondary | ICD-10-CM

## 2021-01-26 LAB — LIPID PANEL
Cholesterol: 155 mg/dL (ref 0–200)
HDL: 68.1 mg/dL (ref >39.00)
LDL Cholesterol: 66 mg/dL (ref 0–99)
NonHDL: 87.36
Total CHOL/HDL Ratio: 2
Triglycerides: 107 mg/dL (ref 0.0–149.0)
VLDL: 21.4 mg/dL (ref 0.0–40.0)

## 2021-01-26 LAB — COMPREHENSIVE METABOLIC PANEL
ALT: 13 U/L (ref 0–35)
AST: 20 U/L (ref 0–37)
Albumin: 3.7 g/dL (ref 3.5–5.2)
Alkaline Phosphatase: 51 U/L (ref 39–117)
BUN: 19 mg/dL (ref 6–23)
CO2: 32 mEq/L (ref 19–32)
Calcium: 8.6 mg/dL (ref 8.4–10.5)
Chloride: 101 mEq/L (ref 96–112)
Creatinine, Ser: 0.62 mg/dL (ref 0.40–1.20)
GFR: 96.42 mL/min (ref >60.00)
Glucose, Bld: 81 mg/dL (ref 70–99)
Potassium: 4.4 mEq/L (ref 3.5–5.1)
Sodium: 138 mEq/L (ref 135–145)
Total Bilirubin: 0.3 mg/dL (ref 0.2–1.2)
Total Protein: 6.2 g/dL (ref 6.0–8.3)

## 2021-01-26 LAB — TSH: TSH: 2.5 u[IU]/mL (ref 0.35–4.50)

## 2021-01-26 LAB — VITAMIN D 25 HYDROXY (VIT D DEFICIENCY, FRACTURES): VITD: 57.1 ng/mL (ref 30.00–100.00)

## 2021-01-26 LAB — PHOSPHORUS: Phosphorus: 3.8 mg/dL (ref 2.3–4.6)

## 2021-01-26 NOTE — Patient Instructions (Addendum)
Today you have you routine preventive visit. A few things to remember from today's visit:  Routine general medical examination at a health care facility  Hypothyroidism, postsurgical  Hypoparathyroidism, unspecified hypoparathyroidism type ([place]) - Plan: Parathyroid hormone, intact (no Ca), Phosphorus, VITAMIN D 25 Hydroxy (Vit-D Deficiency, Fractures), TSH  Mixed hyperlipidemia - Plan: Lipid panel  Conductive hearing loss of right ear, unspecified hearing status on contralateral side - Plan: Ambulatory referral to ENT  Pain of maxillary sinus - Plan: Ambulatory referral to ENT  Screening for endocrine, metabolic and immunity disorder - Plan: Comprehensive metabolic panel  If you need refills please call your pharmacy. Do not use My Chart to request refills or for acute issues that need immediate attention.   Recommend decreasing Simvastatin from 80 mg to 40 mg. Smoking cessation.  Please be sure medication list is accurate. If a new problem present, please set up appointment sooner than planned today.  At least 150 minutes of moderate exercise per week, daily brisk walking for 15-30 min is a good exercise option. Healthy diet low in saturated (animal) fats and sweets and consisting of fresh fruits and vegetables, lean meats such as fish and white chicken and whole grains.  These are some of recommendations for screening depending of age and risk factors:  - Vaccines:  Tdap vaccine every 10 years.  Shingles vaccine recommended at age 61, could be given after 61 years of age but not sure about insurance coverage.   Pneumonia vaccines: Pneumovax at 61. Sometimes Pneumovax is giving earlier if history of smoking, lung disease,diabetes,kidney disease among some.  Screening for diabetes at age 61 and every 3 years.  Cervical cancer prevention:  Pap smear starts at 61 years of age and continues periodically until 61 years old in low risk women. Pap smear every 3 years between 61  and 89 years old. Pap smear every 3-5 years between women 75 and older if pap smear negative and HPV screening negative.   -Breast cancer: Mammogram: There is disagreement between experts about when to start screening in low risk asymptomatic female but recent recommendations are to start screening at 61 and not later than 61 years old , every 1-2 years and after 61 yo q 2 years. Screening is recommended until 61 years old but some women can continue screening depending of healthy issues.  Colon cancer screening: Has been recently changed to 61 yo. Insurance may not cover until you are 61 years old. Screening is recommended until 61 years old.  Cholesterol disorder screening at age 61 and every 3 years. N/A  Also recommended:  1. Dental visit- Brush and floss your teeth twice daily; visit your dentist twice a year. 2. Eye doctor- Get an eye exam at least every 2 years. 3. Helmet use- Always wear a helmet when riding a bicycle, motorcycle, rollerblading or skateboarding. 4. Safe sex- If you may be exposed to sexually transmitted infections, use a condom. 5. Seat belts- Seat belts can save your live; always wear one. 6. Smoke/Carbon Monoxide detectors- These detectors need to be installed on the appropriate level of your home. Replace batteries at least once a year. 7. Skin cancer- When out in the sun please cover up and use sunscreen 15 SPF or higher. 8. Violence- If anyone is threatening or hurting you, please tell your healthcare provider.  9. Drink alcohol in moderation- Limit alcohol intake to one drink or less per day. Never drink and drive. 10. Calcium supplementation 1000 to 1200 mg daily, ideally  through your diet.  Vitamin D supplementation 800 units daily.

## 2021-01-27 ENCOUNTER — Encounter: Payer: Self-pay | Admitting: Family Medicine

## 2021-01-27 LAB — PARATHYROID HORMONE, INTACT (NO CA): PTH: 18 pg/mL (ref 14–64)

## 2021-01-30 MED ORDER — SIMVASTATIN 80 MG PO TABS: 40.0000 mg | ORAL_TABLET | Freq: Every day | ORAL | 1 refills | Status: DC

## 2021-02-07 ENCOUNTER — Other Ambulatory Visit: Payer: Self-pay | Admitting: Cardiovascular Disease

## 2021-02-07 DIAGNOSIS — E782 Mixed hyperlipidemia: Secondary | ICD-10-CM

## 2021-02-09 ENCOUNTER — Other Ambulatory Visit: Payer: Self-pay

## 2021-02-09 DIAGNOSIS — E782 Mixed hyperlipidemia: Secondary | ICD-10-CM

## 2021-02-09 MED ORDER — SIMVASTATIN 80 MG PO TABS: 40.0000 mg | ORAL_TABLET | Freq: Every day | ORAL | 1 refills | Status: DC

## 2021-02-09 NOTE — Telephone Encounter (Signed)
Pt's medication was sent to pt's pharmacy as requested. Confirmation received.  °

## 2021-02-11 DIAGNOSIS — J329 Chronic sinusitis, unspecified: Secondary | ICD-10-CM | POA: Diagnosis not present

## 2021-02-11 DIAGNOSIS — H90A31 Mixed conductive and sensorineural hearing loss, unilateral, right ear with restricted hearing on the contralateral side: Secondary | ICD-10-CM | POA: Diagnosis not present

## 2021-02-11 DIAGNOSIS — H903 Sensorineural hearing loss, bilateral: Secondary | ICD-10-CM | POA: Diagnosis not present

## 2021-02-11 DIAGNOSIS — H6981 Other specified disorders of Eustachian tube, right ear: Secondary | ICD-10-CM | POA: Diagnosis not present

## 2021-02-15 DIAGNOSIS — J3489 Other specified disorders of nose and nasal sinuses: Secondary | ICD-10-CM | POA: Diagnosis not present

## 2021-02-15 DIAGNOSIS — J323 Chronic sphenoidal sinusitis: Secondary | ICD-10-CM | POA: Diagnosis not present

## 2021-02-15 DIAGNOSIS — J320 Chronic maxillary sinusitis: Secondary | ICD-10-CM | POA: Diagnosis not present

## 2021-02-15 DIAGNOSIS — J341 Cyst and mucocele of nose and nasal sinus: Secondary | ICD-10-CM | POA: Diagnosis not present

## 2021-02-15 DIAGNOSIS — J342 Deviated nasal septum: Secondary | ICD-10-CM | POA: Diagnosis not present

## 2021-02-16 ENCOUNTER — Ambulatory Visit (INDEPENDENT_AMBULATORY_CARE_PROVIDER_SITE_OTHER): Payer: BC Managed Care – PPO | Admitting: Otolaryngology

## 2021-03-08 ENCOUNTER — Other Ambulatory Visit: Payer: Self-pay | Admitting: Family Medicine

## 2021-03-08 DIAGNOSIS — E890 Postprocedural hypothyroidism: Secondary | ICD-10-CM

## 2021-03-09 ENCOUNTER — Other Ambulatory Visit: Payer: Self-pay | Admitting: Cardiovascular Disease

## 2021-03-18 ENCOUNTER — Encounter: Payer: Self-pay | Admitting: Cardiovascular Disease

## 2021-03-18 ENCOUNTER — Other Ambulatory Visit: Payer: Self-pay

## 2021-03-18 ENCOUNTER — Ambulatory Visit (INDEPENDENT_AMBULATORY_CARE_PROVIDER_SITE_OTHER): Payer: BC Managed Care – PPO | Admitting: Cardiovascular Disease

## 2021-03-18 VITALS — BP 124/90 | HR 67 | Ht 62.0 in | Wt 127.0 lb

## 2021-03-18 DIAGNOSIS — Z720 Tobacco use: Secondary | ICD-10-CM

## 2021-03-18 DIAGNOSIS — I10 Essential (primary) hypertension: Secondary | ICD-10-CM | POA: Diagnosis not present

## 2021-03-18 DIAGNOSIS — E782 Mixed hyperlipidemia: Secondary | ICD-10-CM

## 2021-03-18 MED ORDER — ROSUVASTATIN CALCIUM 20 MG PO TABS: 20.0000 mg | ORAL_TABLET | Freq: Every day | ORAL | 3 refills | Status: DC

## 2021-03-18 NOTE — Patient Instructions (Signed)
Medication Instructions:  1) STOP SIMVASTATIN 2) START ROSUVASTATIN 20 mg daily *If you need a refill on your cardiac medications before your next appointment, please call your pharmacy*  Lab Work: Your provider recommends that you return for FASTING lab work in 3 months. You may come ANY TIME between 7:30AM and 4:30PM on the day of your appointment.  If you have labs (blood work) drawn today and your tests are completely normal, you will receive your results only by: Marland Kitchen MyChart Message (if you have MyChart) OR . A paper copy in the mail If you have any lab test that is abnormal or we need to change your treatment, we will call you to review the results.  Follow-Up: At [facility], you and your health needs are our priority.  As part of our continuing mission to provide you with exceptional heart care, we have created designated Provider Care Teams.  These Care Teams include your primary Cardiologist (physician) and Advanced Practice Providers (APPs -  Physician Assistants and Nurse Practitioners) who all work together to provide you with the care you need, when you need it. Your next appointment:   12 month(s) The format for your next appointment:   In Person Provider:   You may see [name], MD or one of the following Advanced Practice Providers on your designated Care Team:    Richardson Dopp, PA-C  [place], [place]

## 2021-03-18 NOTE — Progress Notes (Signed)
Cardiology Office Note:    Date:  03/18/2021   ID:  Christina Watts, DOB 23-Nov-1961, MRN [id number]  PCP:  Martinique, Betty G, [place]  Cardiologist:  [name], MD  Advanced Practice Provider:  No care team member to display Electrophysiologist:  None       Referring MD: Martinique, Betty G, MD   Chief Complaint  Patient presents with  . Hypertension    History of Present Illness:    Christina Watts is a 60 y.o. female with a hx of hyperlipidemia and tobacco abuse, presenting for follow-up evaluation.  The patient also has a strong family history of premature coronary artery disease.  Remote cardiac catheterization demonstrated angiographically normal coronary arteries.  The patient is here alone today.  She is doing fairly well.  She denies any chest pain, chest pressure, or shortness of breath.  She does have occasional heart palpitations that she relates to anxiety.  No edema, orthopnea, or PND.  No lightheadedness or syncope.  She is compliant with her medications.  She continues to smoke about 1/2 pack/day.  Past Medical History:  Diagnosis Date  . 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 This was associated with  Subsequent to that time she has had repeat laparoscopies for chronic, particularly right sided, pelvic pain on two occasions, one in 2000 and again in June of 2003.  At each time, pelvic adhesions were noted but no other significant pelvic pathology  . Adenomyosis    uterine adenomyosis, in 1995 total abdominal hysterectomy, right ovarian cystectomy  . Cancer ([place])    melanoma - right leg - good now  . GERD (gastroesophageal reflux disease)   . Headache    Migraines  . Hypercholesterolemia   . Hyperlipidemia   . Hypothyroidism   . Other and unspecified ovarian cysts    long history of recurring ovarian cysts  . Right bundle branch block   . Thyroid disease   . Tobacco abuse   . 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 Ulcer disease    currently asymptomatic and not on medication    Past Surgical History:  Procedure Laterality Date  . ABDOMINAL HYSTERECTOMY    . APPENDECTOMY     for obliteration of the appendiceal tip  . BREAST BIOPSY  02/1992   biopsy of benign breast mass  . CARDIAC CATHETERIZATION N/A 05/14/2015   Procedure: Left Heart Cath and Coronary Angiography;  Surgeon: [name], MD;  Location: [facility];  Service: Cardiovascular;  Laterality: N/A;  . ESOPHAGOGASTRODUODENOSCOPY  12/06/2005   Normal esophagogastroduodenoscopy --  Earle Gell, M.D.  . HARDWARE REMOVAL Left 04/19/2017   Procedure: HARDWARE REMOVAL LEFT ANKLE;  Surgeon: Earlie Server, MD;  Location: [facility];  Service: Orthopedics;  Laterality: Left;  . left ankle surgery    . OVARIAN CYST REMOVAL  1995  . TOTAL ABDOMINAL HYSTERECTOMY W/ BILATERAL SALPINGOOPHORECTOMY  1995  . TOTAL THYROIDECTOMY  05/13/2004   Multiple thyroid nodules, dominant mass, left thyroid lobe -- by, Earnstine Regal, M.D  . TUBAL LIGATION  1989    Current Medications: Current Meds  Medication Sig  . ALPRAZolam (XANAX) 0.5 MG tablet Take 0.5-1 tablets (0.25-0.5 mg total) by mouth daily as needed for anxiety.  Marland Kitchen amLODipine (NORVASC) 5 MG tablet TAKE 1 TABLET BY MOUTH DAILY. PLEASE KEEP UPCOMING APPT IN MARCH 2022 WITH DR. Burt Knack BEFORE ANYMORE REFILLS. THANK YOU  .  Butalbital-APAP-Caffeine 50-300-40 MG CAPS TAKE 1 OR 2 CAPS BY MOUTH EVERY 4 TO 6 HOURS AS NEEDED FOR HEADACHE  . calcium carbonate (OS-CAL) 600 MG TABS Take 1,200 mg by mouth 2 (two) times daily.  Marland Kitchen estradiol (ESTRACE) 2 MG tablet Take 2 mg by mouth 2 (two) times daily.   . fluticasone (FLONASE) 50 MCG/ACT nasal spray Place 1-2 sprays into both nostrils daily.  . nitroGLYCERIN (NITROSTAT) 0.4 MG SL tablet Place 1 tablet (0.4 mg total) under the tongue every 5 (five) minutes as needed for chest pain.  Marland Kitchen omeprazole (PRILOSEC) 20  MG capsule TAKE 1 CAPSULE BY MOUTH EVERY DAY  . rosuvastatin (CRESTOR) 20 MG tablet Take 1 tablet (20 mg total) by mouth daily.  . sertraline (ZOLOFT) 100 MG tablet Take 1 tablet (100 mg total) by mouth daily.  Marland Kitchen SYNTHROID 100 MCG tablet TAKE 1 TABLET BY MOUTH DAILY EXCEPT TAKE 1.5 TABLETS TUESDAY & THURSDAY BEFORE BREAKFAST.  Marland Kitchen XIIDRA 5 % SOLN INSTILL 1 DROP INTO BOTH EYES TWICE A DAY  . [DISCONTINUED] simvastatin (ZOCOR) 80 MG tablet Take 0.5 tablets (40 mg total) by mouth daily at 6 PM. Please keep upcoming appt in March 2022 with Dr. Burt Knack before anymore refills. Thank you     Allergies:   Promethazine hcl   Social History   Socioeconomic History  . Marital status: Married    Spouse name: Not on file  . Number of children: Not on file  . Years of education: Not on file  . Highest education level: Not on file  Occupational History  . Not on file  Tobacco Use  . Smoking status: Current Some Day Smoker    Packs/day: 0.50    Years: 19.00    Pack years: 9.50    Types: Cigarettes    Last attempt to quit: 06/05/2017    Years since quitting: 3.7  . Smokeless tobacco: Never Used  Substance and Sexual Activity  . Alcohol use: Yes    Comment: Drinks wine occas  . Drug use: No  . Sexual activity: Yes    Birth control/protection: Surgical  Other Topics Concern  . Not on file  Social History Narrative   She is married with three children, and she has five sibling who are all still living.   Social Determinants of Health   Financial Resource Strain: Not on file  Food Insecurity: Not on file  Transportation Needs: Not on file  Physical Activity: Not on file  Stress: Not on file  Social Connections: Not on file     Family History: The patient's family history includes Asthma in her mother; Diabetes in her mother; Heart attack in her father; Heart disease in her father and another family member; Hyperlipidemia in an other family member.  ROS:   Please see the history of  present illness.    All other systems reviewed and are negative.  EKGs/Labs/Other Studies Reviewed:    The following studies were reviewed today: Cardiac Cath 05/14/2015: Conclusion  Widely patent coronary arteries  Suspect noncardiac chest pain  Coronary Findings   Diagnostic Dominance: Right  Left Anterior Descending  The vessel is angiographically normal.  Left Circumflex  The vessel is angiographically normal.  Right Coronary Artery  The vessel is angiographically normal.   EKG:  EKG is ordered today.  The ekg ordered today demonstrates normal sinus rhythm 67 bpm, within normal.  Recent Labs: 01/26/2021: ALT 13; BUN 19; Creatinine, Ser 0.62; Potassium 4.4; Sodium 138; TSH 2.50  Recent  Lipid Panel    Component Value Date/Time   CHOL 155 01/26/2021 0900   CHOL 169 12/09/2019 1103   TRIG 107.0 01/26/2021 0900   HDL 68.10 01/26/2021 0900   HDL 87 12/09/2019 1103   CHOLHDL 2 01/26/2021 0900   VLDL 21.4 01/26/2021 0900   LDLCALC 66 01/26/2021 0900   LDLCALC 67 12/09/2019 1103     Risk Assessment/Calculations:       Physical Exam:    VS:  BP 124/90   Pulse 67   Ht 5\' 2"  (1.575 m)   Wt 127 lb (57.6 kg)   SpO2 97%   BMI 23.23 kg/m     Wt Readings from Last 3 Encounters:  03/18/21 127 lb (57.6 kg)  01/26/21 127 lb (57.6 kg)  01/13/21 127 lb 6 oz (57.8 kg)     GEN:  Well nourished, well developed in no acute distress HEENT: Normal NECK: No JVD; No carotid bruits LYMPHATICS: No lymphadenopathy CARDIAC: RRR, no murmurs, rubs, gallops RESPIRATORY:  Clear to auscultation without rales, wheezing or rhonchi  ABDOMEN: Soft, non-tender, non-distended MUSCULOSKELETAL:  No edema; No deformity  SKIN: Warm and dry NEUROLOGIC:  Alert and oriented x 3 PSYCHIATRIC:  Normal affect   ASSESSMENT:    1. Essential hypertension   2. Mixed hyperlipidemia   3. Tobacco abuse    PLAN:    In order of problems listed above:  1. Blood pressure borderline.  Recommend  monitoring for few days..  Discussed proper technique with checking blood pressure at home.  She will document readings and bring them in when she returns for labs in a few months (see below).  Discussed sodium restriction, tobacco cessation, and continuation of amlodipine 5 mg daily. 2. Recommend discontinue simvastatin due to potential drug interaction with calcium channel blockers.  Start rosuvastatin 20 mg daily.  Check lipids and LFTs in 3 months. 3. Counseling done, not ready to quit, down to less than 1/2 pack/day.        Medication Adjustments/Labs and Tests Ordered: Current medicines are reviewed at length with the patient today.  Concerns regarding medicines are outlined above.  Orders Placed This Encounter  Procedures  . Hepatic function panel  . Lipid panel  . EKG 12-Lead   Meds ordered this encounter  Medications  . rosuvastatin (CRESTOR) 20 MG tablet    Sig: Take 1 tablet (20 mg total) by mouth daily.    Dispense:  90 tablet    Refill:  3    Patient Instructions  Medication Instructions:  1) STOP SIMVASTATIN 2) START ROSUVASTATIN 20 mg daily *If you need a refill on your cardiac medications before your next appointment, please call your pharmacy*  Lab Work: Your provider recommends that you return for FASTING lab work in 3 months. You may come ANY TIME between 7:30AM and 4:30PM on the day of your appointment.  If you have labs (blood work) drawn today and your tests are completely normal, you will receive your results only by: Marland Kitchen MyChart Message (if you have MyChart) OR . A paper copy in the mail If you have any lab test that is abnormal or we need to change your treatment, we will call you to review the results.  Follow-Up: At [facility], you and your health needs are our priority.  As part of our continuing mission to provide you with exceptional heart care, we have created designated Provider Care Teams.  These Care Teams include your primary Cardiologist  (physician) and Advanced Practice Providers (  APPs -  Physician Assistants and Nurse Practitioners) who all work together to provide you with the care you need, when you need it. Your next appointment:   12 month(s) The format for your next appointment:   In Person Provider:   You may see [name], MD or one of the following Advanced Practice Providers on your designated Care Team:    Richardson Dopp, PA-C  Robbie Lis, [place]      Signed, [name], MD  03/18/2021 8:32 AM    [place]

## 2021-04-06 ENCOUNTER — Other Ambulatory Visit: Payer: Self-pay | Admitting: Cardiovascular Disease

## 2021-05-10 ENCOUNTER — Other Ambulatory Visit: Payer: Self-pay | Admitting: Family Medicine

## 2021-06-18 ENCOUNTER — Other Ambulatory Visit: Payer: Self-pay

## 2021-06-18 ENCOUNTER — Other Ambulatory Visit: Payer: BC Managed Care – PPO | Admitting: *Deleted

## 2021-06-18 DIAGNOSIS — E782 Mixed hyperlipidemia: Secondary | ICD-10-CM | POA: Diagnosis not present

## 2021-06-18 LAB — HEPATIC FUNCTION PANEL
ALT: 13 IU/L (ref 0–32)
AST: 22 IU/L (ref 0–40)
Albumin: 4.3 g/dL (ref 3.8–4.8)
Alkaline Phosphatase: 65 IU/L (ref 44–121)
Bilirubin Total: <0.2 mg/dL (ref 0.0–1.2)
Bilirubin, Direct: <0.1 mg/dL (ref 0.00–0.40)
Total Protein: 6.9 g/dL (ref 6.0–8.5)

## 2021-06-18 LAB — LIPID PANEL
Chol/HDL Ratio: 2.5 ratio (ref 0.0–4.4)
Cholesterol, Total: 180 mg/dL (ref 100–199)
HDL: 73 mg/dL (ref >39)
LDL Chol Calc (NIH): 84 mg/dL (ref 0–99)
Triglycerides: 134 mg/dL (ref 0–149)
VLDL Cholesterol Cal: 23 mg/dL (ref 5–40)

## 2021-06-25 ENCOUNTER — Encounter: Payer: Self-pay | Admitting: Family Medicine

## 2021-06-29 ENCOUNTER — Telehealth: Payer: Self-pay | Admitting: Nurse Practitioner

## 2021-06-29 DIAGNOSIS — E782 Mixed hyperlipidemia: Secondary | ICD-10-CM

## 2021-06-29 MED ORDER — EZETIMIBE 10 MG PO TABS: 10.0000 mg | ORAL_TABLET | Freq: Every day | ORAL | 3 refills | Status: DC

## 2021-06-29 NOTE — Telephone Encounter (Signed)
Okay to refill Linzess?   Next colonoscopy due in 2026.

## 2021-06-29 NOTE — Telephone Encounter (Signed)
Results and plan of care reviewed with patient. She was off Crestor for 1 week in May to see if it contributed to her dry mouth, which did not improve. She agrees to start Zetia10 mg daily and is scheduled for repeat lab on 10/7. I advised her to call back prior to that appt with any questions or concerns and she thanked me for the call.

## 2021-06-29 NOTE — Telephone Encounter (Signed)
-----   Message from [name], MD sent at 06/28/2021  2:56 PM EDT ----- LDL above goal on crestor 20 mg. LFT's normal. Favor intensification of lipid lowering therapy with zetia 10 mg in addition to crestor if patient willing. Work on lifestyle modification. Repeat labs in 3 months. thanks

## 2021-06-29 NOTE — Telephone Encounter (Signed)
We can discussed constipation medication next visit. Thanks, BJ

## 2021-07-12 ENCOUNTER — Telehealth: Payer: Self-pay

## 2021-07-12 NOTE — Telephone Encounter (Signed)
**Note De-Identified Davan Nawabi Obfuscation** [facility] Key: [id number] - PA Case ID: [id number]   Outcome: Approved today Coverage Starts on: 07/12/2021 12:00:00 AM,Coverage Ends on: 07/12/2022 12:00:00 AM.  Drug: Ezetimibe 10MG  tablets  Form: Librarian, academic PA Form (2017 NCPDP)   I have notified [facility] and the pt Christina Watts [facility] message advising of this approval.

## 2021-07-12 NOTE — Telephone Encounter (Signed)
I was unable to locate any paperwork sent [facility] from [facility].  Reached out to [facility] and they said that pt needs a Prior Auth for her Zetia.  Will route to our PA nurse.

## 2021-07-12 NOTE — Telephone Encounter (Signed)
**Note De-Identified Betania Dizon Obfuscation** Per the pts [facility] message I started a Ezetimibe PA through covermymeds. Key: [id number]  I have notified the pt of this through her [facility] message as well.

## 2021-07-26 ENCOUNTER — Ambulatory Visit: Payer: BC Managed Care – PPO | Admitting: Family Medicine

## 2021-08-02 ENCOUNTER — Encounter: Payer: Self-pay | Admitting: Family Medicine

## 2021-08-02 ENCOUNTER — Telehealth: Payer: Self-pay

## 2021-08-02 MED ORDER — BUDESONIDE-FORMOTEROL FUMARATE 160-4.5 MCG/ACT IN AERO: 2.0000 | INHALATION_SPRAY | Freq: Two times a day (BID) | RESPIRATORY_TRACT | 3 refills | Status: DC

## 2021-08-02 NOTE — Telephone Encounter (Signed)
See mychart encounter, this has been taken care of.

## 2021-08-02 NOTE — Telephone Encounter (Signed)
Patient stated she needs a Rx for inhaler she use to have but d/c and wants a call back for an Rx refill

## 2021-08-10 ENCOUNTER — Other Ambulatory Visit: Payer: Self-pay | Admitting: Family Medicine

## 2021-08-10 DIAGNOSIS — F419 Anxiety disorder, unspecified: Secondary | ICD-10-CM

## 2021-09-02 DIAGNOSIS — R233 Spontaneous ecchymoses: Secondary | ICD-10-CM | POA: Diagnosis not present

## 2021-09-02 DIAGNOSIS — Z85820 Personal history of malignant melanoma of skin: Secondary | ICD-10-CM | POA: Diagnosis not present

## 2021-09-02 DIAGNOSIS — L578 Other skin changes due to chronic exposure to nonionizing radiation: Secondary | ICD-10-CM | POA: Diagnosis not present

## 2021-09-02 DIAGNOSIS — L814 Other melanin hyperpigmentation: Secondary | ICD-10-CM | POA: Diagnosis not present

## 2021-09-14 ENCOUNTER — Other Ambulatory Visit: Payer: Self-pay

## 2021-09-15 ENCOUNTER — Ambulatory Visit: Payer: BC Managed Care – PPO | Admitting: Family Medicine

## 2021-09-15 ENCOUNTER — Other Ambulatory Visit: Payer: Self-pay | Admitting: Internal Medicine

## 2021-09-15 ENCOUNTER — Encounter: Payer: Self-pay | Admitting: Family Medicine

## 2021-09-15 VITALS — BP 120/70 | HR 74 | Resp 16 | Ht 62.0 in | Wt 132.0 lb

## 2021-09-15 DIAGNOSIS — F419 Anxiety disorder, unspecified: Secondary | ICD-10-CM | POA: Diagnosis not present

## 2021-09-15 DIAGNOSIS — Z720 Tobacco use: Secondary | ICD-10-CM

## 2021-09-15 DIAGNOSIS — Z23 Encounter for immunization: Secondary | ICD-10-CM | POA: Diagnosis not present

## 2021-09-15 MED ORDER — NICOTINE 21 MG/24HR TD PT24: 21.0000 mg | MEDICATED_PATCH | Freq: Every day | TRANSDERMAL | 0 refills | Status: DC

## 2021-09-15 NOTE — Patient Instructions (Addendum)
A few things to remember from today's visit:  It is soo great that you quit smoking!!!, I sent some nicotine patches to use for a few weeks.  We can gradually decrease dose. No changes in medications today. I will see you back next year for your physical.  If you need refills please call your pharmacy. Do not use My Chart to request refills or for acute issues that need immediate attention.    Please be sure medication list is accurate. If a new problem present, please set up appointment sooner than planned today.

## 2021-09-15 NOTE — Progress Notes (Deleted)
Void  

## 2021-09-15 NOTE — Progress Notes (Signed)
Chief Complaint  Patient presents with   Follow-up   HPI: Christina Watts is a 60 y.o. female, who is here today for 6 months follow up.   She was last seen on 01/26/21. Anxiety: She is on Sertraline 100 mg daily and Alprazolam 0.5 mg 1/2-1 tab daily prn. The latter one she does not take frequently, about 3 times per week. She has tolerated medication well, no side effects.  Still having crying spells when she thinks about her son's death.  Negative for suicidal thoughts.  Depression screen [facility] 2/9 09/15/2021 01/14/2021 05/24/2019 12/29/2017  Decreased Interest 0 0 3 0  Down, Depressed, Hopeless 1 0 3 0  PHQ - 2 Score 1 0 6 0  Altered sleeping - - 3 -  Tired, decreased energy - - 2 -  Change in appetite - - 0 -  Feeling bad or failure about yourself  - - 2 -  Trouble concentrating - - 0 -  Moving slowly or fidgety/restless - - 2 -  Suicidal thoughts - - 0 -  PHQ-9 Score - - 15 -  Difficult doing work/chores - - Somewhat difficult -   GAD 7 : Generalized Anxiety Score 09/15/2021 05/24/2019  Nervous, Anxious, on Edge 0 2  Control/stop worrying 1 2  Worry too much - different things 1 2  Trouble relaxing 0 1  Restless 0 2  Easily annoyed or irritable 0 2  Afraid - awful might happen 0 2  Total GAD 7 Score 2 13  Anxiety Difficulty Somewhat difficult Somewhat difficult   She has not been able to see her grandson, his mother has not allowed it. She went to court after unsuccessfully trying to reach an agreement with her grandson's mother for visitations; The judge decision was not in her favor. She could appeal but this situation has been painful and aggravates her anxiety.  Counseling monthly.  She has a good family support and in general she feels like she is dealing well.  Since her last visit she quit smoking, her husband did as well. Respiratory symptoms have improved, morning cough. Negative for wheezing and SOB. On Symbicort 16-0-4.5 mcg bid prn.  She craves sometimes,  worse with stress.  She smoked her last cig about a months ago. She was smoking less than a PPD.  Review of Systems  Constitutional:  Positive for fatigue. Negative for activity change, appetite change and fever.  HENT:  Negative for mouth sores, nosebleeds and sore throat.   Cardiovascular:  Negative for chest pain and leg swelling.  Gastrointestinal:  Negative for abdominal pain, nausea and vomiting.       Negative for changes in bowel habits.  Allergic/Immunologic: Positive for environmental allergies.  Neurological:  Negative for syncope, weakness and headaches.  Psychiatric/Behavioral:  Negative for confusion. The patient is nervous/anxious.   Rest of ROS, see pertinent positives sand negatives in HPI  Current Outpatient Medications on File Prior to Visit  Medication Sig Dispense Refill   amLODipine (NORVASC) 5 MG tablet TAKE 1 TABLET BY MOUTH DAILY. PLEASE KEEP UPCOMING APPT IN MARCH 2022 90 tablet 3   budesonide-formoterol (SYMBICORT) 160-4.5 MCG/ACT inhaler Inhale 2 puffs into the lungs 2 (two) times daily. 1 each 3   Butalbital-APAP-Caffeine 50-300-40 MG CAPS TAKE 1 OR 2 CAPS BY MOUTH EVERY 4 TO 6 HOURS AS NEEDED FOR HEADACHE     calcium carbonate (OS-CAL) 600 MG TABS Take 1,200 mg by mouth 2 (two) times daily.     COVID-19  mRNA vaccine, Pfizer, 30 MCG/0.3ML injection AS DIRECTED .3 mL 0   estradiol (ESTRACE) 2 MG tablet Take 2 mg by mouth 2 (two) times daily.      ezetimibe (ZETIA) 10 MG tablet Take 1 tablet (10 mg total) by mouth daily. 90 tablet 3   fluticasone (FLONASE) 50 MCG/ACT nasal spray Place 1-2 sprays into both nostrils daily. 16 g 1   nitroGLYCERIN (NITROSTAT) 0.4 MG SL tablet Place 1 tablet (0.4 mg total) under the tongue every 5 (five) minutes as needed for chest pain. 75 tablet 1   omeprazole (PRILOSEC) 20 MG capsule TAKE 1 CAPSULE BY MOUTH EVERY DAY 90 capsule 1   rosuvastatin (CRESTOR) 20 MG tablet Take 1 tablet (20 mg total) by mouth daily. 90 tablet 3    sertraline (ZOLOFT) 100 MG tablet Take 1 tablet (100 mg total) by mouth daily. 90 tablet 2   SYNTHROID 100 MCG tablet TAKE 1 TABLET BY MOUTH DAILY EXCEPT TAKE 1.5 TABLETS TUESDAY & THURSDAY BEFORE BREAKFAST. 90 tablet 3   XIIDRA 5 % SOLN INSTILL 1 DROP INTO BOTH EYES TWICE A DAY     No current facility-administered medications on file prior to visit.   Past Medical History:  Diagnosis Date   Abscess, periappendiceal    periappendiceal adhesions periappendiceal adhesions. This was associated with  Subsequent to that time she has had repeat laparoscopies for chronic, particularly right sided, pelvic pain on two occasions, one in 2000 and again in June of 2003.  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   Ulcer disease    currently asymptomatic and not on medication   Allergies  Allergen Reactions   Promethazine Hcl Anaphylaxis, Hives and Other (See Comments)    Lungs swell and cant breathe    Social History   Socioeconomic History   Marital status: Married    Spouse name: Not on file   Number of children: Not on file   Years of education: Not on file   Highest education level: Not on file  Occupational History   Not on file  Tobacco Use   Smoking status: Former    Packs/day: 0.50    Years: 19.00    Pack years: 9.50    Types: Cigarettes    Quit date: 07/26/2021    Years since quitting: 0.1   Smokeless tobacco: Never  Substance and Sexual Activity   Alcohol use: Yes     Comment: Drinks wine occas   Drug use: No   Sexual activity: Yes    Birth control/protection: Surgical  Other Topics Concern   Not on file  Social History Narrative   She is married with three children, and she has five sibling who are all still living.   Social Determinants of Health   Financial Resource Strain: Not on file  Food Insecurity: Not on file  Transportation Needs: Not on file  Physical Activity: Not on file  Stress: Not on file  Social Connections: Not on file    Vitals:   09/15/21 0705  BP: 120/70  Pulse: 74  Resp: 16  SpO2: 98%   Wt Readings from Last 3 Encounters:  09/15/21 132 lb (59.9 kg)  03/18/21 127 lb (57.6 kg)  01/26/21 127 lb (57.6 kg)   Body mass index is 24.14 kg/m.  Physical Exam Vitals and nursing note reviewed.  Constitutional:      General: She is not in acute distress.    Appearance: She is well-developed and normal weight.  HENT:     Head: Normocephalic and atraumatic.  Eyes:     Conjunctiva/sclera: Conjunctivae normal.  Cardiovascular:     Rate and Rhythm: Normal rate and regular rhythm.     Heart sounds: No murmur heard. Pulmonary:     Effort: Pulmonary effort is normal. No respiratory distress.     Breath sounds: Normal breath sounds.  Skin:    General: Skin is warm.     Findings: No erythema or rash.  Neurological:     General: No focal deficit present.     Mental Status: She is alert and oriented to person, place, and time.     Gait: Gait normal.  Psychiatric:        Mood and Affect: Mood is anxious. Affect is labile.        Thought Content: Thought content does not include suicidal ideation. Thought content does not include suicidal plan.   ASSESSMENT AND PLAN:  Christina Watts was seen today for 6 months follow-up.  Orders Placed This Encounter  Procedures   Flu Vaccine QUAD 22mo+IM (Fluarix, Fluzone & Alfiuria Quad PF)   Tobacco abuse disorder Congratulated, we discussed benefits of smoking  cessation Nicotine patches 21 mg/day x 28 days then 14 mg and 7 mg respectively.  -     Nicotine (NICODERM CQ - DOSED IN MG/24 HOURS) 21 mg/24hr patch; Place 1 patch (21 mg total) onto the skin daily.  Anxiety disorder, unspecified type Problem is stable. Continue Sertraline 100 mg daily and Alprazolam 0.5 mg daily prn. Continue CBT.  Need for influenza vaccination -     Flu Vaccine QUAD 52mo+IM (Fluarix, Fluzone & Alfiuria Quad PF)  I spent a total of 35 minutes in both face to face and non face to face activities for this visit on the date of this encounter. During this time history was obtained and documented, examination was performed, and assessment/plan discussed.  Return in about 22 weeks (around 02/16/2022) for cpe.   Dannilynn Gallina G. Martinique, MD  [facility] [place] office.

## 2021-09-16 MED ORDER — NICOTINE 21 MG/24HR TD PT24: 21.0000 mg | MEDICATED_PATCH | Freq: Every day | TRANSDERMAL | 0 refills | Status: DC

## 2021-09-21 ENCOUNTER — Telehealth (INDEPENDENT_AMBULATORY_CARE_PROVIDER_SITE_OTHER): Payer: BC Managed Care – PPO | Admitting: Family Medicine

## 2021-09-21 ENCOUNTER — Encounter: Payer: Self-pay | Admitting: Family Medicine

## 2021-09-21 VITALS — Ht 62.0 in

## 2021-09-21 DIAGNOSIS — J309 Allergic rhinitis, unspecified: Secondary | ICD-10-CM

## 2021-09-21 DIAGNOSIS — R42 Dizziness and giddiness: Secondary | ICD-10-CM | POA: Diagnosis not present

## 2021-09-21 MED ORDER — PREDNISONE 20 MG PO TABS: 40.0000 mg | ORAL_TABLET | Freq: Every day | ORAL | 0 refills | Status: AC

## 2021-09-21 MED ORDER — FLUTICASONE PROPIONATE 50 MCG/ACT NA SUSP: 1.0000 | Freq: Every day | NASAL | 1 refills | Status: DC

## 2021-09-21 NOTE — Progress Notes (Signed)
Virtual Visit via Video Note I connected with Christina Watts on 09/21/21 by a video enabled telemedicine application and verified that I am speaking with the correct person using two identifiers.  Location patient: home Location provider:Home office Persons participating in the virtual visit: patient, provider  I discussed the limitations of evaluation and management by telemedicine and the availability of in person appointments. The patient expressed understanding and agreed to proceed.  Chief Complaint  Patient presents with   head cold   Dizziness   HPI: Ms. Friley is a 60 yo female with hx of allergies,GERD,anxiety,and HTN c/o 3 days of nasal congestion,sinus pressure,and dysphonia. Lightheadedness exacerbated by movement when she is up. It does not happen when she is in bed. It lasts a few seconds. No associated symptoms. Right earache, mild,yesterday. No changes in hearing. Negative for recent travel.  Episode of nose bleed. Negative for gum bleeding, bruising,swollen glands,melena,blood in stool,or gross hematuria. + Fatigue.  Her son Dx'ed with COVID 19 infection a week ago but she has not been in contact with him for about 2-3 weeks.  Negative for fever,chills, body aches,sore throat,ageustia,anosmia,wheezing,SOB,CP, abdominal pain, nausea, vomiting, urinary symptoms, or skin rash. She tried OTC Tylenol Sinus, did not help.  Allergic rhinitis, she is not using Flonase nasal spray.  ROS: See pertinent positives and negatives per HPI.  Past Medical History:  Diagnosis Date   Abscess, periappendiceal    periappendiceal adhesions periappendiceal adhesions. This was associated with  Subsequent to that time she has had repeat laparoscopies for chronic, particularly right sided, pelvic pain on two occasions, one in 2000 and again in June of 2003.  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   HARDWARE REMOVAL Left 04/19/2017   Procedure: HARDWARE REMOVAL LEFT ANKLE;  Surgeon: Earlie Server, MD;  Location: [facility];  Service: Orthopedics;  Laterality: Left;   left ankle surgery     OVARIAN CYST REMOVAL  1995   TOTAL ABDOMINAL HYSTERECTOMY W/ BILATERAL SALPINGOOPHORECTOMY  1995   TOTAL THYROIDECTOMY  05/13/2004   Multiple thyroid nodules, dominant mass, left thyroid lobe -- by, Earnstine Regal, M.D   TUBAL LIGATION  1989    Family History  Problem Relation Age of Onset   Heart disease Father    Heart attack  Father    Asthma Mother    Diabetes Mother    Heart disease Other        fathers side has a strong history of heart disease   Hyperlipidemia Other        strong family history of    Social History   Socioeconomic History   Marital status: Married    Spouse name: Not on file   Number of children: Not on file   Years of education: Not on file   Highest education  level: Not on file  Occupational History   Not on file  Tobacco Use   Smoking status: Former    Packs/day: 0.50    Years: 19.00    Pack years: 9.50    Types: Cigarettes    Quit date: 07/26/2021    Years since quitting: 0.1   Smokeless tobacco: Never  Substance and Sexual Activity   Alcohol use: Yes    Comment: Drinks wine occas   Drug use: No   Sexual activity: Yes    Birth control/protection: Surgical  Other Topics Concern   Not on file  Social History Narrative   She is married with three children, and she has five sibling who are all still living.   Social Determinants of Health   Financial Resource Strain: Not on file  Food Insecurity: Not on file  Transportation Needs: Not on file  Physical Activity: Not on file  Stress: Not on file  Social Connections: Not on file  Intimate Partner Violence: Not on file   Current Outpatient Medications:    ALPRAZolam (XANAX) 0.5 MG tablet, TAKE 0.5-1 TABLETS (0.25-0.5 MG TOTAL) BY MOUTH DAILY AS NEEDED FOR ANXIETY, Disp: 30 tablet, Rfl: 0   amLODipine (NORVASC) 5 MG tablet, TAKE 1 TABLET BY MOUTH DAILY. PLEASE KEEP UPCOMING APPT IN MARCH 2022, Disp: 90 tablet, Rfl: 3   budesonide-formoterol (SYMBICORT) 160-4.5 MCG/ACT inhaler, Inhale 2 puffs into the lungs 2 (two) times daily., Disp: 1 each, Rfl: 3   Butalbital-APAP-Caffeine 50-300-40 MG CAPS, TAKE 1 OR 2 CAPS BY MOUTH EVERY 4 TO 6 HOURS AS NEEDED FOR HEADACHE, Disp: , Rfl:    calcium carbonate (OS-CAL) 600 MG TABS, Take 1,200 mg by mouth 2 (two) times daily., Disp: , Rfl:    COVID-19 mRNA vaccine, Pfizer, 30 MCG/0.3ML injection, AS DIRECTED, Disp: .3 mL, Rfl: 0   estradiol (ESTRACE) 2 MG tablet, Take 2 mg by mouth 2 (two) times daily. , Disp: , Rfl:    ezetimibe (ZETIA) 10 MG tablet, Take 1 tablet (10 mg total) by mouth daily., Disp: 90 tablet, Rfl: 3   nicotine (NICODERM CQ - DOSED IN MG/24 HOURS) 21 mg/24hr patch, Place 1 patch (21 mg total) onto the skin daily., Disp: 28 patch, Rfl:  0   nitroGLYCERIN (NITROSTAT) 0.4 MG SL tablet, Place 1 tablet (0.4 mg total) under the tongue every 5 (five) minutes as needed for chest pain., Disp: 75 tablet, Rfl: 1   omeprazole (PRILOSEC) 20 MG capsule, TAKE 1 CAPSULE BY MOUTH EVERY DAY, Disp: 90 capsule, Rfl: 1   predniSONE (DELTASONE) 20 MG tablet, Take 2 tablets (40 mg total) by mouth daily with breakfast for 3 days., Disp: 6 tablet, Rfl: 0   rosuvastatin (CRESTOR) 20 MG tablet, Take 1 tablet (20 mg total) by mouth daily., Disp: 90 tablet, Rfl: 3   sertraline (ZOLOFT) 100 MG tablet, Take 1 tablet (100 mg total) by mouth daily., Disp: 90 tablet, Rfl: 2   SYNTHROID 100 MCG tablet,  TAKE 1 TABLET BY MOUTH DAILY EXCEPT TAKE 1.5 TABLETS TUESDAY & THURSDAY BEFORE BREAKFAST., Disp: 90 tablet, Rfl: 3   XIIDRA 5 % SOLN, INSTILL 1 DROP INTO BOTH EYES TWICE A DAY, Disp: , Rfl:    fluticasone (FLONASE) 50 MCG/ACT nasal spray, Place 1-2 sprays into both nostrils daily., Disp: 16 g, Rfl: 1  EXAM:  VITALS per patient if applicable:Ht 5\' 2"  (1.575 m)   BMI 24.14 kg/m   GENERAL: alert, oriented, appears well and in no acute distress  HEENT: atraumatic, conjunctiva clear, no obvious abnormalities on inspection of external nose and ears Mild dysphonia.  NECK: normal movements of the head and neck  LUNGS: on inspection no signs of respiratory distress, breathing rate appears normal, no obvious gross SOB, gasping or wheezing. Non productive cough x 1.  CV: no obvious cyanosis  MS: moves all visible extremities without noticeable abnormality  PSYCH/NEURO: pleasant and cooperative, no obvious depression or anxiety, speech and thought processing grossly intact  ASSESSMENT AND PLAN:  Discussed the following assessment and plan:  Allergic rhinitis, unspecified seasonality, unspecified trigger - Plan: predniSONE (DELTASONE) 20 MG tablet, fluticasone (FLONASE) 50 MCG/ACT nasal spray Most of the symptoms she is reporting can be caused by this  problem.  We discussed differential Dx's, which include a viral illness.  I do not think abx is needed at this time. Short course of Prednisone may help with sinus pressure, she has tolerated medication ell in the past. Saline nasal irrigations as needed. Monitor for new symptoms. Recommend home COVID 19 test.  Lightheadedness We discussed possible etiologies. Description of symptom does not suggest vertigo.Allergies can be a contributing factor.  Hx does not suggest a serious process. Fall prevention.  I discussed the assessment and treatment plan with the patient. Ms. Myhre was provided an opportunity to ask questions and all were answered. She agreed with the plan and demonstrated an understanding of the instructions.  No follow-ups on file.  Baleigh Rennaker Martinique, MD

## 2021-10-01 ENCOUNTER — Other Ambulatory Visit: Payer: BC Managed Care – PPO | Admitting: *Deleted

## 2021-10-01 ENCOUNTER — Other Ambulatory Visit: Payer: Self-pay

## 2021-10-01 DIAGNOSIS — E782 Mixed hyperlipidemia: Secondary | ICD-10-CM | POA: Diagnosis not present

## 2021-10-01 LAB — LIPID PANEL
Chol/HDL Ratio: 2 ratio (ref 0.0–4.4)
Cholesterol, Total: 142 mg/dL (ref 100–199)
HDL: 71 mg/dL (ref >39)
LDL Chol Calc (NIH): 47 mg/dL (ref 0–99)
Triglycerides: 144 mg/dL (ref 0–149)
VLDL Cholesterol Cal: 24 mg/dL (ref 5–40)

## 2021-10-01 LAB — HEPATIC FUNCTION PANEL
ALT: 13 IU/L (ref 0–32)
AST: 19 IU/L (ref 0–40)
Albumin: 3.9 g/dL (ref 3.8–4.8)
Alkaline Phosphatase: 54 IU/L (ref 44–121)
Bilirubin Total: <0.2 mg/dL (ref 0.0–1.2)
Bilirubin, Direct: <0.1 mg/dL (ref 0.00–0.40)
Total Protein: 6 g/dL (ref 6.0–8.5)

## 2021-10-13 ENCOUNTER — Other Ambulatory Visit: Payer: Self-pay | Admitting: Family Medicine

## 2021-10-13 DIAGNOSIS — J309 Allergic rhinitis, unspecified: Secondary | ICD-10-CM

## 2021-10-14 DIAGNOSIS — H16223 Keratoconjunctivitis sicca, not specified as Sjogren's, bilateral: Secondary | ICD-10-CM | POA: Diagnosis not present

## 2021-10-27 ENCOUNTER — Other Ambulatory Visit: Payer: Self-pay | Admitting: Family Medicine

## 2021-10-28 ENCOUNTER — Other Ambulatory Visit: Payer: Self-pay | Admitting: Family Medicine

## 2021-10-28 DIAGNOSIS — F419 Anxiety disorder, unspecified: Secondary | ICD-10-CM

## 2021-11-05 ENCOUNTER — Other Ambulatory Visit: Payer: Self-pay | Admitting: Internal Medicine

## 2021-11-05 DIAGNOSIS — F419 Anxiety disorder, unspecified: Secondary | ICD-10-CM

## 2021-11-13 ENCOUNTER — Other Ambulatory Visit: Payer: Self-pay | Admitting: Family Medicine

## 2021-11-13 DIAGNOSIS — J309 Allergic rhinitis, unspecified: Secondary | ICD-10-CM

## 2021-11-29 ENCOUNTER — Other Ambulatory Visit: Payer: Self-pay

## 2021-11-29 DIAGNOSIS — J309 Allergic rhinitis, unspecified: Secondary | ICD-10-CM

## 2021-11-29 MED ORDER — FLUTICASONE PROPIONATE 50 MCG/ACT NA SUSP: 1.0000 | Freq: Every day | NASAL | 1 refills | Status: DC

## 2021-12-07 ENCOUNTER — Telehealth: Payer: Self-pay | Admitting: Family Medicine

## 2021-12-07 NOTE — Telephone Encounter (Signed)
Patient calling in with respiratory symptoms: Shortness of breath, chest pain, palpitations or other red words send to Triage  Does the patient have a fever over 100, cough, congestion, sore throat, runny nose, lost of taste/smell (please list symptoms that patient has)? Head congestion and ear pain x 2 days  What date did symptoms start?12-05-2021 (If over 5 days ago, pt may be scheduled for in person visit)  Have you tested for Covid in the last 5 days? No   If yes, was it positive []  OR negative [] ? If positive in the last 5 days, please schedule virtual visit now. If negative, schedule for an in person OV with the next available provider if PCP has no openings. Please also let patient know they will be tested again (follow the script below)  "you will have to arrive 53mins prior to your appt time to be Covid tested. Please park in back of office at the cone & call [phone] to let the staff know you have arrived. A staff member will meet you at your car to do a rapid covid test. Once the test has resulted you will be notified by phone of your results to determine if appt will remain an in person visit or be converted to a virtual/phone visit. If you arrive less than 56mins before your appt time, your visit will be automatically converted to virtual & any recommended testing will happen AFTER the visit."  Pt has virtual with dr Martinique on 12-10-2021 THINGS TO REMEMBER  If no availability for virtual visit in office,  please schedule another [facility] office  If no availability at another [facility] office, please instruct patient that they can schedule an evisit or virtual visit through their mychart account. Visits up to 8pm  patients can be seen in office 5 days after positive COVID test

## 2021-12-07 NOTE — Telephone Encounter (Signed)
Offered patient an opening on Dr Elease Hashimoto schedule for today at 2:45. Pt request appt to be VV due to being at work. Pt advised that it can be VV, however, if she was having ear pain, her ear could not be assessed during a VV; also advised that waiting until 12/16, her ear pain may become worse. Pt verb understanding & stated that she was going to try to go to [facility] when she got off work today. Pt declines offer for VV appt with Dr Elease Hashimoto & states she will keep [place] updated; does not want to cancel 12/16 appt with Martinique yet.

## 2021-12-08 ENCOUNTER — Encounter (HOSPITAL_BASED_OUTPATIENT_CLINIC_OR_DEPARTMENT_OTHER): Payer: Self-pay

## 2021-12-08 ENCOUNTER — Other Ambulatory Visit: Payer: Self-pay

## 2021-12-08 ENCOUNTER — Other Ambulatory Visit: Payer: Self-pay | Admitting: Internal Medicine

## 2021-12-08 ENCOUNTER — Emergency Department (HOSPITAL_BASED_OUTPATIENT_CLINIC_OR_DEPARTMENT_OTHER)
Admission: EM | Admit: 2021-12-08 | Discharge: 2021-12-08 | Disposition: A | Discharge status: Home / Self Care | Payer: BC Managed Care – PPO | Attending: Emergency Medicine | Admitting: Emergency Medicine

## 2021-12-08 DIAGNOSIS — I10 Essential (primary) hypertension: Secondary | ICD-10-CM | POA: Diagnosis not present

## 2021-12-08 DIAGNOSIS — Z79899 Other long term (current) drug therapy: Secondary | ICD-10-CM | POA: Insufficient documentation

## 2021-12-08 DIAGNOSIS — Z87891 Personal history of nicotine dependence: Secondary | ICD-10-CM | POA: Diagnosis not present

## 2021-12-08 DIAGNOSIS — Z85820 Personal history of malignant melanoma of skin: Secondary | ICD-10-CM | POA: Insufficient documentation

## 2021-12-08 DIAGNOSIS — G501 Atypical facial pain: Secondary | ICD-10-CM | POA: Diagnosis not present

## 2021-12-08 DIAGNOSIS — E039 Hypothyroidism, unspecified: Secondary | ICD-10-CM | POA: Diagnosis not present

## 2021-12-08 DIAGNOSIS — F419 Anxiety disorder, unspecified: Secondary | ICD-10-CM

## 2021-12-08 DIAGNOSIS — R519 Headache, unspecified: Secondary | ICD-10-CM | POA: Diagnosis not present

## 2021-12-08 MED ORDER — METHYLPREDNISOLONE 4 MG PO TBPK: ORAL_TABLET | ORAL | 0 refills | Status: DC

## 2021-12-08 NOTE — ED Provider Notes (Signed)
[facility] EMERGENCY DEPARTMENT Provider Note   CSN: [id number] Arrival date & time: 12/08/21  1203     History Chief Complaint  Patient presents with   Facial Swelling    Christina Watts is a 60 y.o. female who presents with facial pain. Patient was started on a new medication called Tyrvaya, which is an intranasal spray for chronic dry eyes.  Patient states that when she started it she began having peeling on her eyelids.  Yesterday she began having burning around her nasolabial folds, mouth, nose worse on the left side.  She feels like it is swollen.  She also complains of pain in her mouth along the gumline of the palate on the left side.  She feels that she may have some ear pain on the left.  She denies any other changes in lotions or soaps.  She has been using Aquaphor on her eyelids because of the peeling.  She stopped using the medication this past Monday due to the change in her eyes.  HPI     Past Medical History:  Diagnosis Date   Abscess, periappendiceal    periappendiceal adhesions periappendiceal adhesions. This was associated with  Subsequent to that time she has had repeat laparoscopies for chronic, particularly right sided, pelvic pain on two occasions, one in 2000 and again in June of 2003.  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   Ulcer disease    currently asymptomatic and not on medication    Patient Active Problem List   Diagnosis Date  Noted   Essential hypertension, benign 07/06/2020   Allergic rhinitis 07/06/2020   Anxiety disorder 05/24/2019   Reactive airway disease that is not asthma 06/12/2017   GERD (gastroesophageal reflux disease) 06/12/2017   Hypothyroidism, postsurgical 07/25/2016   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   Tobacco abuse disorder 07/25/2016   Hypocalcemia 07/25/2016   Chest tightness or pressure 06/06/2011   Hyperlipemia 06/06/2011    Past Surgical History:  Procedure Laterality Date   ABDOMINAL HYSTERECTOMY     APPENDECTOMY     for obliteration of the appendiceal tip   BREAST BIOPSY  02/1992   biopsy of benign breast mass   CARDIAC CATHETERIZATION N/A 05/14/2015   Procedure: Left Heart Cath and Coronary Angiography;  Surgeon: [name], MD;  Location: [facility];  Service: Cardiovascular;  Laterality: N/A;   ESOPHAGOGASTRODUODENOSCOPY  12/06/2005   Normal esophagogastroduodenoscopy --  Earle Gell, M.D.   HARDWARE REMOVAL Left 04/19/2017   Procedure: HARDWARE REMOVAL LEFT ANKLE;  Surgeon: Earlie Server, MD;  Location: [facility];  Service: Orthopedics;  Laterality: Left;   left ankle surgery     OVARIAN CYST REMOVAL  1995   TOTAL ABDOMINAL HYSTERECTOMY W/ BILATERAL SALPINGOOPHORECTOMY  1995   TOTAL THYROIDECTOMY  05/13/2004   Multiple thyroid nodules, dominant mass, left thyroid lobe -- by, Earnstine Regal,  M.D   TUBAL LIGATION  1989     OB History   No obstetric history on file.     Family History  Problem Relation Age of Onset   Heart disease Father    Heart attack Father    Asthma Mother    Diabetes Mother    Heart disease Other        fathers side has a strong history of heart disease   Hyperlipidemia Other        strong family history of    Social History   Tobacco Use   Smoking status: Former    Packs/day: 0.50    Years: 19.00    Pack years: 9.50    Types: Cigarettes    Quit date: 07/26/2021    Years since quitting: 0.3   Smokeless  tobacco: Never  Substance Use Topics   Alcohol use: Yes    Comment: Drinks wine occas   Drug use: No    Home Medications Prior to Admission medications   Medication Sig Start Date End Date Taking? Authorizing Provider  methylPREDNISolone (MEDROL DOSEPAK) 4 MG TBPK tablet Use as directed 12/08/21  Yes Bobbie Virden, PA-C  ALPRAZolam (XANAX) 0.5 MG tablet TAKE 0.5-1 TABLETS (0.25-0.5 MG TOTAL) BY MOUTH DAILY AS NEEDED FOR ANXIETY 12/08/21   Martinique, Betty G, MD  amLODipine (NORVASC) 5 MG tablet TAKE 1 TABLET BY MOUTH DAILY. PLEASE KEEP UPCOMING APPT IN MARCH 2022 04/06/21   [name], MD  budesonide-formoterol [facility]) 160-4.5 MCG/ACT inhaler Inhale 2 puffs into the lungs 2 (two) times daily. 08/02/21   Martinique, Betty G, MD  Butalbital-APAP-Caffeine 50-300-40 MG CAPS TAKE 1 OR 2 CAPS BY MOUTH EVERY 4 TO 6 HOURS AS NEEDED FOR HEADACHE 06/18/19   [provider]  calcium carbonate (OS-CAL) 600 MG TABS Take 1,200 mg by mouth 2 (two) times daily.    [provider]  estradiol (ESTRACE) 2 MG tablet Take 2 mg by mouth 2 (two) times daily.  05/12/11   [provider]  ezetimibe (ZETIA) 10 MG tablet Take 1 tablet (10 mg total) by mouth daily. 06/29/21   [name], MD  fluticasone [facility]) 50 MCG/ACT nasal spray Place 1-2 sprays into both nostrils daily. 11/29/21   Martinique, Betty G, MD  nicotine (NICODERM CQ - DOSED IN MG/24 HOURS) 21 mg/24hr patch Place 1 patch (21 mg total) onto the skin daily. 09/16/21   Martinique, Betty G, MD  nitroGLYCERIN (NITROSTAT) 0.4 MG SL tablet Place 1 tablet (0.4 mg total) under the tongue every 5 (five) minutes as needed for chest pain. 01/15/18   [name], MD  omeprazole (PRILOSEC) 20 MG capsule TAKE 1 CAPSULE BY MOUTH EVERY DAY 10/27/21   Martinique, Betty G, MD  rosuvastatin (CRESTOR) 20 MG tablet Take 1 tablet (20 mg total) by mouth daily. 03/18/21 03/13/22  [name], MD  sertraline (ZOLOFT) 100 MG tablet TAKE 1 TABLET BY MOUTH EVERY  DAY 10/29/21   Martinique, Betty G, MD  SYNTHROID 100 MCG tablet TAKE 1 TABLET BY MOUTH DAILY EXCEPT TAKE 1.5 TABLETS TUESDAY & THURSDAY BEFORE BREAKFAST. 03/08/21   Martinique, Betty G, MD  XIIDRA 5 % SOLN INSTILL 1 DROP INTO BOTH EYES TWICE A DAY 06/25/19   [provider]    Allergies    Promethazine hcl  Review of Systems   Review of Systems  Ten systems reviewed and are negative for acute change, except as noted in the HPI.   Physical Exam Updated Vital Signs BP Marland Kitchen)  167/77 (BP Location: Left Arm)    Pulse 80    Temp 98.2 F (36.8 C) (Oral)    Resp 20    Ht 5\' 2"  (1.575 m)    Wt 57.2 kg    SpO2 100%    BMI 23.05 kg/m   Physical Exam Vitals and nursing note reviewed.  Constitutional:      General: She is not in acute distress.    Appearance: She is well-developed. She is not diaphoretic.  HENT:     Head: Normocephalic and atraumatic.     Comments: Erythema over the bilateral eyelids and around the medial sides of the face.  No obvious swelling.  Erythema is very mild    Right Ear: Tympanic membrane and external ear normal.     Left Ear: Tympanic membrane and external ear normal.     Nose:     Comments: Left naris with obvious erythema and irritation of the nasal tissue as compared to the right, no bleeding    Mouth/Throat:     Mouth: Mucous membranes are moist.     Comments: Small ulceration above the left first upper molar on the hard palate  Eyes:     General: No scleral icterus.    Conjunctiva/sclera: Conjunctivae normal.  Cardiovascular:     Rate and Rhythm: Normal rate and regular rhythm.     Heart sounds: Normal heart sounds. No murmur heard.   No friction rub. No gallop.  Pulmonary:     Effort: Pulmonary effort is normal. No respiratory distress.     Breath sounds: Normal breath sounds.  Abdominal:     General: Bowel sounds are normal. There is no distension.     Palpations: Abdomen is soft. There is no mass.     Tenderness: There is no abdominal tenderness.  There is no guarding.  Musculoskeletal:     Cervical back: Normal range of motion.  Skin:    General: Skin is warm and dry.  Neurological:     Mental Status: She is alert and oriented to person, place, and time.    ED Results / Procedures / Treatments   Labs (all labs ordered are listed, but only abnormal results are displayed) Labs Reviewed - No data to display  EKG None  Radiology No results found.  Procedures Procedures   Medications Ordered in ED Medications - No data to display  ED Course  I have reviewed the triage vital signs and the nursing notes.  Pertinent labs & imaging results that were available during my care of the patient were reviewed by me and considered in my medical decision making (see chart for details).    MDM Rules/Calculators/A&P                         Patient here with complaints of facial pain.  There is no obvious swelling.  Does not appear to be sinus infection I do think this is a drug reaction and she is having some mild erythema and irritation.  Patient will be started on a Medrol Dosepak.  She has follow-up with her primary care physician this coming Thursday.  Discussed outpatient follow-up and return precautions.  This does not appear consistent with SJS or TENS. Final Clinical Impression(s) / ED Diagnoses Final diagnoses:  Facial pain    Rx / DC Orders ED Discharge Orders          Ordered    methylPREDNISolone (MEDROL DOSEPAK) 4 MG TBPK tablet  12/08/21 1332             Margarita Mail, PA-C 12/08/21 1343    Lucrezia Starch, MD 12/11/21 [phone]

## 2021-12-08 NOTE — ED Triage Notes (Signed)
Pt c/o facial swelling started last night-states only new meds is for dry eyes-NAD-steady gait

## 2021-12-08 NOTE — Discharge Instructions (Signed)
Contact a health care provider if: You have fever. Your rash is not going away. Your rash gets worse. Your rash comes back. You have high-pitched whistling sounds when you breathe, most often when you breathe out (wheezing) or coughing. Get help right away if: You start to have breathing problems. You start to have shortness of breath. Your face or throat starts to swell. You have severe weakness with dizziness or fainting. You have chest pain. Your skin starts to blister and peel.

## 2021-12-10 ENCOUNTER — Encounter: Payer: Self-pay | Admitting: Family Medicine

## 2021-12-10 ENCOUNTER — Telehealth (INDEPENDENT_AMBULATORY_CARE_PROVIDER_SITE_OTHER): Payer: BC Managed Care – PPO | Admitting: Family Medicine

## 2021-12-10 VITALS — Ht 62.0 in

## 2021-12-10 DIAGNOSIS — H9202 Otalgia, left ear: Secondary | ICD-10-CM | POA: Diagnosis not present

## 2021-12-10 DIAGNOSIS — R600 Localized edema: Secondary | ICD-10-CM | POA: Diagnosis not present

## 2021-12-10 DIAGNOSIS — Z122 Encounter for screening for malignant neoplasm of respiratory organs: Secondary | ICD-10-CM

## 2021-12-10 NOTE — Progress Notes (Signed)
Virtual Visit via Video Note I connected with Christina Watts on 12/10/21 by a video enabled telemedicine application and verified that I am speaking with the correct person using two identifiers.  Location patient: home Location provider:work office Persons participating in the virtual visit: patient, provider  I discussed the limitations of evaluation and management by telemedicine and the availability of in person appointments. The patient expressed understanding and agreed to proceed.  Chief Complaint  Patient presents with   Ear Pain   head congestion    X 2 days, seen at [facility] and given steroid Dosepak.   HPI: Christina Watts is a 60 yo female with hx of GERD,anxiety,allergies,HLD, and hypothyroidism following today on recent ED visit. She was evaluated in the ED on 12/08/2021 because of left-sided facial edema and left earache. A few days before problem presented she started new eyedrops for dry eye, Tyrvaya, it was discontinued.  No other new medications, sick contact, or outdoor exposure. She was started on methylprednisolone, facial, edema has resolved. She has tolerated oral steroids well.  Left earache has also improved. No changes in hearing or ear drainage. Allergic rhinitis with mild rhinorrhea and nasal congestion.  Negative for fever,abnormal wt loss, dysphagia,associated fever, dysphagia, CP, cough, wheezing, dyspnea.  Former smoker. Quit smoking in 07/2021. Smoker since age 50 1 pack lasted 2-3 days. Stopped in between for 1-2 years at the time.  ROS: See pertinent positives and negatives per HPI.  Past Medical History:  Diagnosis Date   Abscess, periappendiceal    periappendiceal adhesions periappendiceal adhesions. This was associated with  Subsequent to that time she has had repeat laparoscopies for chronic, particularly right sided, pelvic pain on two occasions, one in 2000 and again in June of 2003.  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   HARDWARE REMOVAL Left 04/19/2017   Procedure: HARDWARE REMOVAL LEFT ANKLE;  Surgeon: Earlie Server, MD;  Location: [facility];  Service: Orthopedics;  Laterality: Left;   left ankle surgery     OVARIAN CYST REMOVAL  1995   TOTAL ABDOMINAL HYSTERECTOMY W/ BILATERAL SALPINGOOPHORECTOMY  1995   TOTAL THYROIDECTOMY  05/13/2004   Multiple thyroid nodules, dominant mass, left thyroid lobe -- by, Earnstine Regal, M.D   TUBAL LIGATION  [phone]  Family History  Problem Relation Age of Onset   Heart disease Father    Heart attack Father    Asthma Mother    Diabetes Mother    Heart disease Other        fathers side has a strong history of heart disease   Hyperlipidemia Other        strong family history of    Social History   Socioeconomic History   Marital status: Married    Spouse name: Not on file   Number of  children: Not on file   Years of education: Not on file   Highest education level: Not on file  Occupational History   Not on file  Tobacco Use   Smoking status: Former    Packs/day: 0.50    Years: 19.00    Pack years: 9.50    Types: Cigarettes    Quit date: 07/26/2021    Years since quitting: 0.3   Smokeless tobacco: Never  Substance and Sexual Activity   Alcohol use: Yes    Comment: Drinks wine occas   Drug use: No   Sexual activity: Yes    Birth control/protection: Surgical  Other Topics Concern   Not on file  Social History Narrative   She is married with three children, and she has five sibling who are all still living.   Social Determinants of Health   Financial Resource Strain: Not on file  Food Insecurity: Not on file  Transportation Needs: Not on file  Physical Activity: Not on file  Stress: Not on file  Social Connections: Not on file  Intimate Partner Violence: Not on file      Current Outpatient Medications:    ALPRAZolam (XANAX) 0.5 MG tablet, TAKE 0.5-1 TABLETS (0.25-0.5 MG TOTAL) BY MOUTH DAILY AS NEEDED FOR ANXIETY, Disp: 30 tablet, Rfl: 3   amLODipine (NORVASC) 5 MG tablet, TAKE 1 TABLET BY MOUTH DAILY. PLEASE KEEP UPCOMING APPT IN MARCH 2022, Disp: 90 tablet, Rfl: 3   budesonide-formoterol (SYMBICORT) 160-4.5 MCG/ACT inhaler, Inhale 2 puffs into the lungs 2 (two) times daily., Disp: 1 each, Rfl: 3   Butalbital-APAP-Caffeine 50-300-40 MG CAPS, TAKE 1 OR 2 CAPS BY MOUTH EVERY 4 TO 6 HOURS AS NEEDED FOR HEADACHE, Disp: , Rfl:    calcium carbonate (OS-CAL) 600 MG TABS, Take 1,200 mg by mouth 2 (two) times daily., Disp: , Rfl:    estradiol (ESTRACE) 2 MG tablet, Take 2 mg by mouth 2 (two) times daily. , Disp: , Rfl:    ezetimibe (ZETIA) 10 MG tablet, Take 1 tablet (10 mg total) by mouth daily., Disp: 90 tablet, Rfl: 3   fluticasone (FLONASE) 50 MCG/ACT nasal spray, Place 1-2 sprays into both nostrils daily., Disp: 48 mL, Rfl: 1   methylPREDNISolone (MEDROL  DOSEPAK) 4 MG TBPK tablet, Use as directed, Disp: 21 tablet, Rfl: 0   nicotine (NICODERM CQ - DOSED IN MG/24 HOURS) 21 mg/24hr patch, Place 1 patch (21 mg total) onto the skin daily., Disp: 28 patch, Rfl: 0   nitroGLYCERIN (NITROSTAT) 0.4 MG SL tablet, Place 1 tablet (0.4 mg total) under the tongue every 5 (five) minutes as needed for chest pain., Disp: 75 tablet, Rfl: 1   omeprazole (PRILOSEC) 20 MG capsule, TAKE 1 CAPSULE BY MOUTH EVERY DAY, Disp: 90 capsule, Rfl: 3   rosuvastatin (CRESTOR) 20 MG tablet, Take 1 tablet (20 mg total) by mouth daily., Disp: 90 tablet, Rfl: 3   sertraline (ZOLOFT) 100 MG tablet, TAKE 1 TABLET BY  MOUTH EVERY DAY, Disp: 90 tablet, Rfl: 3   SYNTHROID 100 MCG tablet, TAKE 1 TABLET BY MOUTH DAILY EXCEPT TAKE 1.5 TABLETS TUESDAY & THURSDAY BEFORE BREAKFAST., Disp: 90 tablet, Rfl: 3   XIIDRA 5 % SOLN, INSTILL 1 DROP INTO BOTH EYES TWICE A DAY, Disp: , Rfl:   EXAM:  VITALS per patient if applicable:Ht 5\' 2"  (1.575 m)    BMI 23.05 kg/m   GENERAL: alert, oriented, appears well and in no acute distress  HEENT: atraumatic, conjunctiva clear, no obvious abnormalities on inspection of external nose and ears. No left earache with pulling external ear or pressing tragus.  NECK: normal movements of the head and neck  LUNGS: on inspection no signs of respiratory distress, breathing rate appears normal, no obvious gross SOB, gasping or wheezing  CV: no obvious cyanosis  MS: moves all visible extremities without noticeable abnormality  PSYCH/NEURO: pleasant and cooperative, no obvious depression or anxiety, speech and thought processing grossly intact  ASSESSMENT AND PLAN:  Discussed the following assessment and plan:  Facial edema - Plan: DG Chest 2 View Problem has resolved after starting oral steroids, she will complete treatment. We discussed possible etiologies, certainly new eyedrops seem to be the causative factor. Because of history of tobacco use, recommend  chest x-ray that, appointment arranged. Instructed about warning signs.  Earache on left Improved. ? Eustachian tube dysfunction. F/U as needed.  We discussed recommendations for lung cancer screening,not indicated at this time based on smoking hx.  I discussed the assessment and treatment plan with the patient. The patient was provided an opportunity to ask questions and all were answered. The patient agreed with the plan and demonstrated an understanding of the instructions.  Return if symptoms worsen or fail to improve. Blinda Turek G. Martinique, MD  [facility]. [place] office.

## 2021-12-13 ENCOUNTER — Ambulatory Visit (INDEPENDENT_AMBULATORY_CARE_PROVIDER_SITE_OTHER): Payer: BC Managed Care – PPO

## 2021-12-13 ENCOUNTER — Other Ambulatory Visit: Payer: Self-pay

## 2021-12-13 ENCOUNTER — Telehealth: Payer: Self-pay | Admitting: Family Medicine

## 2021-12-13 ENCOUNTER — Encounter: Payer: Self-pay | Admitting: Family Medicine

## 2021-12-13 ENCOUNTER — Other Ambulatory Visit: Payer: BC Managed Care – PPO

## 2021-12-13 DIAGNOSIS — R600 Localized edema: Secondary | ICD-10-CM | POA: Diagnosis not present

## 2021-12-13 NOTE — Telephone Encounter (Signed)
Letter sent to patient via mychart.  

## 2021-12-13 NOTE — Telephone Encounter (Signed)
Pt is calling and needs work note put in her mychart that said she was seen today

## 2021-12-14 ENCOUNTER — Encounter: Payer: Self-pay | Admitting: Family Medicine

## 2021-12-30 ENCOUNTER — Other Ambulatory Visit (HOSPITAL_COMMUNITY): Payer: Self-pay

## 2021-12-30 DIAGNOSIS — Z1231 Encounter for screening mammogram for malignant neoplasm of breast: Secondary | ICD-10-CM | POA: Diagnosis not present

## 2021-12-30 DIAGNOSIS — Z6824 Body mass index (BMI) 24.0-24.9, adult: Secondary | ICD-10-CM | POA: Diagnosis not present

## 2021-12-30 DIAGNOSIS — Z01419 Encounter for gynecological examination (general) (routine) without abnormal findings: Secondary | ICD-10-CM | POA: Diagnosis not present

## 2022-01-17 DIAGNOSIS — H16223 Keratoconjunctivitis sicca, not specified as Sjogren's, bilateral: Secondary | ICD-10-CM | POA: Diagnosis not present

## 2022-01-26 NOTE — Progress Notes (Signed)
HPI: [name] is a 61 y.o. female, who is here today for her routine physical.  Last CPE: 01/26/21  Regular exercise 3 or more time per week: She has not been consistent since she quit smoking. Following a healthy diet: More chicken, red meat, and pork. She is snacking on cookies and eats pizza Fridays. She lives with her husband.   Chronic medical problems: Anxiety,HTN,HLD,hypothyroidism,hypoparathyroidism, and allergies among some.  Immunization History  Administered Date(s) Administered   H1N1 11/28/2008   Influenza, Seasonal, Injecte, Preservative Fre 10/28/2015   Influenza,inj,Quad PF,6+ Mos 10/25/2012, 12/28/2016, 01/02/2019, 10/11/2019, 01/13/2021, 09/15/2021   Influenza,trivalent, recombinat, inj, PF 01/23/2015   PFIZER(Purple Top)SARS-COV-2 Vaccination 09/04/2020, 09/25/2020   Pneumococcal Polysaccharide-23 01/02/2019   Tdap 07/01/2006, 12/26/2013, 01/08/2016   Zoster Recombinat (Shingrix) 01/28/2022   Health Maintenance  Topic Date Due   Zoster Vaccines- Shingrix (1 of 2) Never done   COVID-19 Vaccine (3 - Booster for [place] series) 02/13/2022 (Originally 11/20/2020)   MAMMOGRAM  12/27/2023   COLONOSCOPY (Pts 45-80yrs Insurance coverage will need to be confirmed)  06/24/2025   TETANUS/TDAP  01/07/2026   INFLUENZA VACCINE  Completed   Hepatitis C Screening  Completed   HPV VACCINES  Aged Out   PAP SMEAR-Modifier  Discontinued   HIV Screening  Discontinued  She follows with gyn regularly.  Anxiety on Alprazolam 0.5 mg 1/2-1 tab daily prn and Sertraline 100 mg daily.  HTN on Amlodipine 5 mg daily.  CXR done on 12/13/21 showed epicardial fat pad adjacent to the right side of the heart, that has been documented on the previous CT scan and multiple previous chest x-rays..She is concerned about this findings, has not discussed with her cardiologist.  HLD on Rosuvastatin 20 mg daily and Zetia 10 mg daily.  Lab Results  Component Value Date   CHOL 142  10/01/2021   HDL 71 10/01/2021   LDLCALC 47 10/01/2021   TRIG 144 10/01/2021   CHOLHDL 2.0 10/01/2021   Hypothyroidism on Synthroid 100 mcg daily. 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 She has seen endocrinologist years ago.  Review of Systems  Constitutional:  Negative for appetite change and fever.  HENT:  Negative for hearing loss, mouth sores, sore throat and trouble swallowing.   Eyes:  Negative for redness and visual disturbance.  Respiratory:  Negative for cough, shortness of breath and wheezing.   Cardiovascular:  Negative for chest pain and leg swelling.  Gastrointestinal:  Negative for abdominal pain, nausea and vomiting.       No changes in bowel habits.  Endocrine: Negative for cold intolerance, heat intolerance, polydipsia, polyphagia and polyuria.  Genitourinary:  Negative for decreased urine volume, dysuria, hematuria, vaginal bleeding and vaginal discharge.  Musculoskeletal:  Negative for gait problem and myalgias.  Skin:  Negative for color change and rash.  Allergic/Immunologic: Positive for environmental allergies.  Neurological:  Negative for syncope, weakness and headaches.  Hematological:  Negative for adenopathy. Does not bruise/bleed easily.  Psychiatric/Behavioral:  Negative for confusion and hallucinations. The patient is nervous/anxious.   All other systems reviewed and are negative.  Current Outpatient Medications on File Prior to Visit  Medication Sig Dispense Refill   ALPRAZolam (  XANAX) 0.5 MG tablet TAKE 0.5-1 TABLETS (0.25-0.5 MG TOTAL) BY MOUTH DAILY AS NEEDED FOR ANXIETY 30 tablet 3   amLODipine (NORVASC) 5 MG tablet TAKE 1 TABLET BY MOUTH DAILY. PLEASE KEEP UPCOMING APPT IN MARCH  2022 90 tablet 3   budesonide-formoterol (SYMBICORT) 160-4.5 MCG/ACT inhaler Inhale 2 puffs into the lungs 2 (two) times daily. 1 each 3   calcium carbonate (OS-CAL) 600 MG TABS Take 1,200 mg by mouth 2 (two) times daily.     estradiol (ESTRACE) 2 MG tablet Take 2 mg by mouth 2 (two) times daily.      ezetimibe (ZETIA) 10 MG tablet Take 1 tablet (10 mg total) by mouth daily. 90 tablet 3   fluticasone (FLONASE) 50 MCG/ACT nasal spray Place 1-2 sprays into both nostrils daily. 48 mL 1   nitroGLYCERIN (NITROSTAT) 0.4 MG SL tablet Place 1 tablet (0.4 mg total) under the tongue every 5 (five) minutes as needed for chest pain. 75 tablet 1   omeprazole (PRILOSEC) 20 MG capsule TAKE 1 CAPSULE BY MOUTH EVERY DAY 90 capsule 3   rosuvastatin (CRESTOR) 20 MG tablet Take 1 tablet (20 mg total) by mouth daily. 90 tablet 3   sertraline (ZOLOFT) 100 MG tablet TAKE 1 TABLET BY MOUTH EVERY DAY 90 tablet 3   SYNTHROID 100 MCG tablet TAKE 1 TABLET BY MOUTH DAILY EXCEPT TAKE 1.5 TABLETS TUESDAY & THURSDAY BEFORE BREAKFAST. 90 tablet 3   XIIDRA 5 % SOLN INSTILL 1 DROP INTO BOTH EYES TWICE A DAY     No current facility-administered medications on file prior to visit.   Past Medical History:  Diagnosis Date   Abscess, periappendiceal    periappendiceal adhesions periappendiceal adhesions. This was associated with  Subsequent to that time she has had repeat laparoscopies for chronic, particularly right sided, pelvic pain on two occasions, one in 2000 and again in June of 2003.  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   HARDWARE REMOVAL Left 04/19/2017   Procedure: HARDWARE REMOVAL LEFT ANKLE;  Surgeon: Earlie Server, MD;  Location: [facility];  Service: Orthopedics;  Laterality: Left;   left ankle surgery     OVARIAN CYST REMOVAL  1995   TOTAL ABDOMINAL HYSTERECTOMY W/ BILATERAL SALPINGOOPHORECTOMY  1995   TOTAL THYROIDECTOMY  05/13/2004   Multiple thyroid nodules, dominant mass, left thyroid lobe -- by, Earnstine Regal, M.D   TUBAL LIGATION  1989    Allergies  Allergen Reactions   Promethazine Hcl Anaphylaxis, Hives and Other (See Comments)    Lungs swell and cant breathe    Family History  Problem Relation Age of Onset   Heart disease Father    Heart attack Father    Asthma Mother    Diabetes Mother    Heart disease Other        fathers side has a strong history of heart disease   Hyperlipidemia Other        strong family history of    Social History   Socioeconomic History   Marital status: Married    Spouse name: Not on file   Number of children: Not on file   Years of education: Not on file   Highest education level: Not on file  Occupational History   Not on file  Tobacco Use   Smoking status: Former    Packs/day: 0.50    Years: 19.00    Pack years: 9.50    Types: Cigarettes    Quit date:  07/26/2021    Years since quitting: 0.5   Smokeless tobacco: Never  Substance and Sexual Activity   Alcohol use: Yes    Comment: Drinks wine occas   Drug use: No   Sexual activity: Yes    Birth control/protection: Surgical  Other Topics Concern   Not on file  Social History Narrative   She is married with three children, and she has five sibling who are all still living.   Social Determinants of Health   Financial Resource Strain: Not on file  Food Insecurity: Not on file  Transportation Needs: Not on file  Physical Activity: Not on file  Stress: Not on file  Social Connections: Not on file   Vitals:   01/28/22 0659  BP: 120/70  Pulse: 83  Resp: 16  SpO2: 97%   Body mass index is 24.01 kg/m.  Wt Readings from Last 3 Encounters:  01/28/22 131 lb 4 oz (59.5 kg)  12/08/21 126 lb (57.2 kg)  09/15/21 132 lb (59.9 kg)   Physical Exam Vitals and nursing note reviewed.  Constitutional:      General: She is not in acute distress.    Appearance: She is well-developed and normal weight.  HENT:     Head: Normocephalic and atraumatic.     Right Ear: Hearing, tympanic membrane, ear canal and external ear normal.     Left Ear: Hearing, tympanic membrane, ear canal and external ear normal.     Mouth/Throat:     Mouth: Mucous membranes are moist.     Pharynx: Oropharynx is clear. Uvula midline.  Eyes:     Extraocular Movements: Extraocular movements intact.     Conjunctiva/sclera: Conjunctivae normal.     Pupils: Pupils are equal, round, and reactive to light.  Neck:     Thyroid: No thyroid mass.  Cardiovascular:     Rate and Rhythm: Normal rate and regular rhythm.     Heart sounds: No murmur heard.    Comments: DP and PT pulses present bilateral. Pulmonary:     Effort: Pulmonary effort is normal. No respiratory distress.     Breath sounds: Normal breath sounds.  Abdominal:     Palpations: Abdomen is soft. There is no hepatomegaly or mass.     Tenderness: There is no  abdominal tenderness.  Genitourinary:    Comments: Deferred to gyn. Musculoskeletal:     Comments: No major deformity or signs of synovitis appreciated.  Lymphadenopathy:     Cervical: No cervical adenopathy.     Upper Body:     Right upper body: No supraclavicular adenopathy.     Left upper body: No supraclavicular adenopathy.  Skin:    General: Skin is warm.     Findings: No erythema or rash.  Neurological:     General: No focal deficit present.     Mental Status: She is alert and oriented to person, place, and time.     Cranial Nerves: No cranial nerve deficit.     Coordination: Coordination normal.     Gait: Gait normal.     Deep Tendon Reflexes:     Reflex Scores:      Bicep reflexes are 2+ on the right side and 2+ on the left side.      Patellar reflexes are 2+ on the right side and 2+ on the left side. Psychiatric:        Speech: Speech normal.     Comments: Well groomed, good eye contact.   ASSESSMENT AND PLAN:  Christina Watts was here today annual physical examination.  Orders Placed This Encounter  Procedures   Varicella-zoster vaccine IM   Parathyroid hormone, intact (no Ca)   VITAMIN D 25 Hydroxy (Vit-D Deficiency, Fractures)   TSH   Basic metabolic panel   Hemoglobin A1c   Lab Results  Component Value Date   HGBA1C 6.2 01/28/2022   Lab Results  Component Value Date   PTH 18 01/26/2021   CALCIUM 8.7 01/28/2022   CAION 4.9 07/25/2016   PHOS 3.8 01/26/2021   Lab Results  Component Value Date   TSH 0.53 01/28/2022   Lab Results  Component Value Date   CREATININE 0.78 01/28/2022   BUN 23 01/28/2022   NA 138 01/28/2022   K 4.6 01/28/2022   CL 101 01/28/2022   CO2 34 (H) 01/28/2022   Routine general medical examination at a health care facility We discussed the importance of regular physical activity and healthy diet for prevention of chronic illness and/or complications. Preventive guidelines reviewed. Vaccination updated. Ca++ and vit D  supplementation to continue. Female preventive care with her gyn. Next CPE in a year.  Screening for endocrine, metabolic and immunity disorder -     Hemoglobin A1c -     Basic metabolic panel  Need for shingles vaccine -     Varicella-zoster vaccine IM  Hypothyroidism, postsurgical Problem has been stable. Continue current management.  Hypoparathyroidism ([place]) Continue ca++ supplementation. Further recommendations according to lab results.  Essential hypertension, benign BP adequately controlled. Continue Amlodipine same dose. Low salt diet.  Return in 6 months (on 07/28/2022) for anxiety.  Eloni Darius G. Martin[place], MD  [facility]. [place] office.

## 2022-01-28 ENCOUNTER — Encounter: Payer: Self-pay | Admitting: Family Medicine

## 2022-01-28 ENCOUNTER — Ambulatory Visit (INDEPENDENT_AMBULATORY_CARE_PROVIDER_SITE_OTHER): Payer: BC Managed Care – PPO | Admitting: Family Medicine

## 2022-01-28 VITALS — BP 120/70 | HR 83 | Resp 16 | Ht 62.0 in | Wt 131.2 lb

## 2022-01-28 DIAGNOSIS — Z0000 Encounter for general adult medical examination without abnormal findings: Secondary | ICD-10-CM | POA: Diagnosis not present

## 2022-01-28 DIAGNOSIS — E209 Hypoparathyroidism, unspecified: Secondary | ICD-10-CM

## 2022-01-28 DIAGNOSIS — Z23 Encounter for immunization: Secondary | ICD-10-CM

## 2022-01-28 DIAGNOSIS — E782 Mixed hyperlipidemia: Secondary | ICD-10-CM

## 2022-01-28 DIAGNOSIS — Z13228 Encounter for screening for other metabolic disorders: Secondary | ICD-10-CM | POA: Diagnosis not present

## 2022-01-28 DIAGNOSIS — Z130 Encounter for screening for diseases of the blood and blood-forming organs and certain disorders involving the immune mechanism: Secondary | ICD-10-CM | POA: Diagnosis not present

## 2022-01-28 DIAGNOSIS — E890 Postprocedural hypothyroidism: Secondary | ICD-10-CM | POA: Diagnosis not present

## 2022-01-28 DIAGNOSIS — Z1329 Encounter for screening for other suspected endocrine disorder: Secondary | ICD-10-CM | POA: Diagnosis not present

## 2022-01-28 DIAGNOSIS — I10 Essential (primary) hypertension: Secondary | ICD-10-CM | POA: Diagnosis not present

## 2022-01-28 LAB — BASIC METABOLIC PANEL
BUN: 23 mg/dL (ref 6–23)
CO2: 34 mEq/L — ABNORMAL HIGH (ref 19–32)
Calcium: 8.7 mg/dL (ref 8.4–10.5)
Chloride: 101 mEq/L (ref 96–112)
Creatinine, Ser: 0.78 mg/dL (ref 0.40–1.20)
GFR: 81.66 mL/min (ref >60.00)
Glucose, Bld: 81 mg/dL (ref 70–99)
Potassium: 4.6 mEq/L (ref 3.5–5.1)
Sodium: 138 mEq/L (ref 135–145)

## 2022-01-28 LAB — HEMOGLOBIN A1C: Hgb A1c MFr Bld: 6.2 % (ref 4.6–6.5)

## 2022-01-28 LAB — TSH: TSH: 0.53 u[IU]/mL (ref 0.35–5.50)

## 2022-01-28 LAB — VITAMIN D 25 HYDROXY (VIT D DEFICIENCY, FRACTURES): VITD: 57.65 ng/mL (ref 30.00–100.00)

## 2022-01-28 NOTE — Patient Instructions (Addendum)
A few things to remember from today's visit:  Routine general medical examination at a health care facility  Hypothyroidism, postsurgical - Plan: TSH  Essential hypertension, benign  Hypoparathyroidism, unspecified hypoparathyroidism type ([place]) - Plan: Parathyroid hormone, intact (no Ca), VITAMIN D 25 Hydroxy (Vit-D Deficiency, Fractures)  Screening for endocrine, metabolic and immunity disorder - Plan: Basic metabolic panel, Hemoglobin A1c  If you need refills please call your pharmacy. Do not use My Chart to request refills or for acute issues that need immediate attention.   Please be sure medication list is accurate. If a new problem present, please set up appointment sooner than planned today.  Health Maintenance, Female Adopting a healthy lifestyle and getting preventive care are important in promoting health and wellness. Ask your health care provider about: The right schedule for you to have regular tests and exams. Things you can do on your own to prevent diseases and keep yourself healthy. What should I know about diet, weight, and exercise? Eat a healthy diet  Eat a diet that includes plenty of vegetables, fruits, low-fat dairy products, and lean protein. Do not eat a lot of foods that are high in solid fats, added sugars, or sodium. Maintain a healthy weight Body mass index (BMI) is used to identify weight problems. It estimates body fat based on height and weight. Your health care provider can help determine your BMI and help you achieve or maintain a healthy weight. Get regular exercise Get regular exercise. This is one of the most important things you can do for your health. Most adults should: Exercise for at least 150 minutes each week. The exercise should increase your heart rate and make you sweat (moderate-intensity exercise). Do strengthening exercises at least twice a week. This is in addition to the moderate-intensity exercise. Spend less time sitting. Even  light physical activity can be beneficial. Watch cholesterol and blood lipids Have your blood tested for lipids and cholesterol at 62 years of age, then have this test every 5 years. Have your cholesterol levels checked more often if: Your lipid or cholesterol levels are high. You are older than 62 years of age. You are at high risk for heart disease. What should I know about cancer screening? Depending on your health history and family history, you may need to have cancer screening at various ages. This may include screening for: Breast cancer. Cervical cancer. Colorectal cancer. Skin cancer. Lung cancer. What should I know about heart disease, diabetes, and high blood pressure? Blood pressure and heart disease High blood pressure causes heart disease and increases the risk of stroke. This is more likely to develop in people who have high blood pressure readings or are overweight. Have your blood pressure checked: Every 3-5 years if you are 70-71 years of age. Every year if you are 32 years old or older. Diabetes Have regular diabetes screenings. This checks your fasting blood sugar level. Have the screening done: Once every three years after age 21 if you are at a normal weight and have a low risk for diabetes. More often and at a younger age if you are overweight or have a high risk for diabetes. What should I know about preventing infection? Hepatitis B If you have a higher risk for hepatitis B, you should be screened for this virus. Talk with your health care provider to find out if you are at risk for hepatitis B infection. Hepatitis C Testing is recommended for: Everyone born from [birth year] through 1966. Anyone with known risk  factors for hepatitis C. Sexually transmitted infections (STIs) Get screened for STIs, including gonorrhea and chlamydia, if: You are sexually active and are younger than 62 years of age. You are older than 62 years of age and your health care provider tells  you that you are at risk for this type of infection. Your sexual activity has changed since you were last screened, and you are at increased risk for chlamydia or gonorrhea. Ask your health care provider if you are at risk. Ask your health care provider about whether you are at high risk for HIV. Your health care provider may recommend a prescription medicine to help prevent HIV infection. If you choose to take medicine to prevent HIV, you should first get tested for HIV. You should then be tested every 3 months for as long as you are taking the medicine. Pregnancy If you are about to stop having your period (premenopausal) and you may become pregnant, seek counseling before you get pregnant. Take 400 to 800 micrograms (mcg) of folic acid every day if you become pregnant. Ask for birth control (contraception) if you want to prevent pregnancy. Osteoporosis and menopause Osteoporosis is a disease in which the bones lose minerals and strength with aging. This can result in bone fractures. If you are 56 years old or older, or if you are at risk for osteoporosis and fractures, ask your health care provider if you should: Be screened for bone loss. Take a calcium or vitamin D supplement to lower your risk of fractures. Be given hormone replacement therapy (HRT) to treat symptoms of menopause. Follow these instructions at home: Alcohol use Do not drink alcohol if: Your health care provider tells you not to drink. You are pregnant, may be pregnant, or are planning to become pregnant. If you drink alcohol: Limit how much you have to: 0-1 drink a day. Know how much alcohol is in your drink. In the [place] one drink equals one 12 oz bottle of beer (355 mL), one 5 oz glass of wine (148 mL), or one 1 oz glass of hard liquor (44 mL). Lifestyle Do not use any products that contain nicotine or tobacco. These products include cigarettes, chewing tobacco, and vaping devices, such as e-cigarettes. If you need help  quitting, ask your health care provider. Do not use street drugs. Do not share needles. Ask your health care provider for help if you need support or information about quitting drugs. General instructions Schedule regular health, dental, and eye exams. Stay current with your vaccines. Tell your health care provider if: You often feel depressed. You have ever been abused or do not feel safe at home. Summary Adopting a healthy lifestyle and getting preventive care are important in promoting health and wellness. Follow your health care provider's instructions about healthy diet, exercising, and getting tested or screened for diseases. Follow your health care provider's instructions on monitoring your cholesterol and blood pressure. This information is not intended to replace advice given to you by your health care provider. Make sure you discuss any questions you have with your health care provider. Document Revised: 05/03/2021 Document Reviewed: 05/03/2021 Elsevier Patient Education  [place].

## 2022-01-28 NOTE — Assessment & Plan Note (Signed)
Problem has been stable. Continue current management.

## 2022-01-28 NOTE — Assessment & Plan Note (Signed)
BP adequately controlled. Continue Amlodipine same dose. Low salt diet.

## 2022-01-28 NOTE — Assessment & Plan Note (Signed)
Continue ca++ supplementation. Further recommendations according to lab results.

## 2022-01-31 LAB — PARATHYROID HORMONE, INTACT (NO CA): PTH: 18 pg/mL (ref 16–77)

## 2022-02-07 ENCOUNTER — Encounter: Payer: Self-pay | Admitting: Family Medicine

## 2022-02-11 ENCOUNTER — Ambulatory Visit: Payer: BC Managed Care – PPO | Admitting: Family Medicine

## 2022-02-11 ENCOUNTER — Encounter: Payer: Self-pay | Admitting: Family Medicine

## 2022-02-11 VITALS — BP 130/80 | HR 78 | Resp 16 | Ht 62.0 in | Wt 131.2 lb

## 2022-02-11 DIAGNOSIS — E209 Hypoparathyroidism, unspecified: Secondary | ICD-10-CM | POA: Diagnosis not present

## 2022-02-11 DIAGNOSIS — R202 Paresthesia of skin: Secondary | ICD-10-CM | POA: Diagnosis not present

## 2022-02-11 LAB — CBC
HCT: 41.1 % (ref 36.0–46.0)
Hemoglobin: 13.7 g/dL (ref 12.0–15.0)
MCHC: 33.4 g/dL (ref 30.0–36.0)
MCV: 91.6 fl (ref 78.0–100.0)
Platelets: 363 10*3/uL (ref 150.0–400.0)
RBC: 4.49 Mil/uL (ref 3.87–5.11)
RDW: 12.3 % (ref 11.5–15.5)
WBC: 8.9 10*3/uL (ref 4.0–10.5)

## 2022-02-11 LAB — BASIC METABOLIC PANEL
BUN: 16 mg/dL (ref 6–23)
CO2: 32 mEq/L (ref 19–32)
Calcium: 8.1 mg/dL — ABNORMAL LOW (ref 8.4–10.5)
Chloride: 99 mEq/L (ref 96–112)
Creatinine, Ser: 0.71 mg/dL (ref 0.40–1.20)
GFR: 91.39 mL/min (ref >60.00)
Glucose, Bld: 81 mg/dL (ref 70–99)
Potassium: 4.5 mEq/L (ref 3.5–5.1)
Sodium: 136 mEq/L (ref 135–145)

## 2022-02-11 LAB — VITAMIN B12: Vitamin B-12: 447 pg/mL (ref 211–911)

## 2022-02-11 LAB — PHOSPHORUS: Phosphorus: 3.7 mg/dL (ref 2.3–4.6)

## 2022-02-11 NOTE — Progress Notes (Signed)
Chief Complaint  Patient presents with   Tingling    Upper and lower lip.    HPI: [name] is a 61 y.o. female with hx of hypothyroidism,hypoparathyroidism,anxiety, and HTN here today withy above compliant. Lip tingling that started a few days after last visit, 02/07/22. She is very concerned because this was the initial symptom when she was first Dx'ed with hypoparathyroidism, after thyroidectomy. Problem is constant, improved after she increased Ca++ supplementation by one extra tab.  She had nausea, vomiting,and diarrhea about 7-10 days ago that lasted a day. Negative for fever,chills,sore throat, dysphagia,CP,palpitations,abdominal pain, fasciculations, confusion,or mood changes. Lab Results  Component Value Date   PTH 18 01/28/2022   CALCIUM 8.7 01/28/2022   CAION 4.9 07/25/2016   PHOS 3.8 01/26/2021   Review of Systems  Constitutional:  Negative for activity change and appetite change.  Eyes:  Negative for redness and visual disturbance.  Respiratory:  Negative for cough, shortness of breath and wheezing.   Gastrointestinal:  Negative for nausea and vomiting.       Negative for changes in bowel habits.  Genitourinary:  Negative for decreased urine volume, difficulty urinating and hematuria.  Musculoskeletal:  Negative for gait problem and myalgias.  Skin:  Negative for rash.  Neurological:  Negative for syncope, weakness and headaches.  Rest see pertinent positives and negatives per HPI.  Current Outpatient Medications on File Prior to Visit  Medication Sig Dispense Refill   ALPRAZolam (XANAX) 0.5 MG tablet TAKE 0.5-1 TABLETS (0.25-0.5 MG TOTAL) BY MOUTH DAILY AS NEEDED FOR ANXIETY 30 tablet 3   amLODipine (NORVASC) 5 MG tablet TAKE 1 TABLET BY MOUTH DAILY. PLEASE KEEP UPCOMING APPT IN MARCH 2022 90 tablet 3   budesonide-formoterol (SYMBICORT) 160-4.5 MCG/ACT inhaler Inhale 2 puffs into the lungs 2 (two) times daily. 1 each 3   calcium carbonate (OS-CAL) 600 MG  TABS Take 1,200 mg by mouth 2 (two) times daily.     estradiol (ESTRACE) 2 MG tablet Take 2 mg by mouth 2 (two) times daily.      ezetimibe (ZETIA) 10 MG tablet Take 1 tablet (10 mg total) by mouth daily. 90 tablet 3   fluticasone (FLONASE) 50 MCG/ACT nasal spray Place 1-2 sprays into both nostrils daily. 48 mL 1   nitroGLYCERIN (NITROSTAT) 0.4 MG SL tablet Place 1 tablet (0.4 mg total) under the tongue every 5 (five) minutes as needed for chest pain. 75 tablet 1   omeprazole (PRILOSEC) 20 MG capsule TAKE 1 CAPSULE BY MOUTH EVERY DAY 90 capsule 3   rosuvastatin (CRESTOR) 20 MG tablet Take 1 tablet (20 mg total) by mouth daily. 90 tablet 3   sertraline (ZOLOFT) 100 MG tablet TAKE 1 TABLET BY MOUTH EVERY DAY 90 tablet 3   SYNTHROID 100 MCG tablet TAKE 1 TABLET BY MOUTH DAILY EXCEPT TAKE 1.5 TABLETS TUESDAY & THURSDAY BEFORE BREAKFAST. 90 tablet 3   XIIDRA 5 % SOLN INSTILL 1 DROP INTO BOTH EYES TWICE A DAY     No current facility-administered medications on file prior to visit.    Past Medical History:  Diagnosis Date   Abscess, periappendiceal    periappendiceal adhesions periappendiceal adhesions. This was associated with  Subsequent to that time she has had repeat laparoscopies for chronic, particularly right sided, pelvic pain on two occasions, one in 2000 and again in June of 2003.  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   Ulcer disease    currently asymptomatic and not on  medication   Allergies  Allergen Reactions   Promethazine Hcl Anaphylaxis, Hives and Other (See Comments)    Lungs swell and cant breathe    Social History   Socioeconomic History   Marital status: Married    Spouse name: Not on file   Number of children: Not on file   Years of education: Not on file   Highest education level: 12th grade  Occupational History   Not on file  Tobacco Use   Smoking status: Former    Packs/day: 0.50    Years: 19.00    Pack years: 9.50    Types: Cigarettes    Quit date: 07/26/2021    Years since quitting: 0.5   Smokeless tobacco: Never  Substance and Sexual Activity   Alcohol use: Yes    Comment: Drinks wine occas   Drug use: No   Sexual activity: Yes    Birth control/protection: Surgical  Other Topics Concern   Not on file  Social History Narrative   She is married with three children, and she has five sibling who are all still living.   Social Determinants of Health   Financial Resource Strain: Low Risk    Difficulty of Paying Living Expenses: Not hard at all  Food Insecurity: No Food Insecurity   Worried About Charity fundraiser in the Last Year: Never true   [place] in the Last Year: Never true  Transportation Needs: No Transportation Needs   Lack of Transportation (Medical): No   Lack of Transportation (Non-Medical): No  Physical Activity: Insufficiently Active   Days of Exercise per Week: 3 days   Minutes of Exercise per Session: 30 min  Stress: No Stress Concern Present   Feeling of Stress : Not at all  Social Connections: Unknown   Frequency of Communication with Friends and Family: More than three times a week   Frequency of Social Gatherings with Friends and Family: Once a week   Attends Religious Services: Patient refused   Marine scientist or Organizations: No   Attends Archivist Meetings: Not on file   Marital Status: Married   Vitals:   02/11/22 1159  BP: 130/80  Pulse: 78  Resp: 16   SpO2: 98%   Body mass index is 24.01 kg/m.  Physical Exam Vitals and nursing note reviewed.  Constitutional:      General: She is not in acute distress.    Appearance: She is well-developed.  HENT:     Head: Normocephalic and atraumatic.     Mouth/Throat:     Mouth: Mucous membranes are moist.  Eyes:     Conjunctiva/sclera: Conjunctivae normal.  Cardiovascular:     Rate and Rhythm: Normal rate and regular rhythm.     Heart sounds: No murmur heard. Pulmonary:     Effort: Pulmonary effort is normal. No respiratory distress.  Breath sounds: Normal breath sounds.  Abdominal:     Palpations: Abdomen is soft. There is no mass.     Tenderness: There is no abdominal tenderness.  Lymphadenopathy:     Cervical: No cervical adenopathy.  Skin:    General: Skin is warm.     Findings: No erythema or rash.  Neurological:     General: No focal deficit present.     Mental Status: She is alert and oriented to person, place, and time.     Cranial Nerves: No cranial nerve deficit.     Gait: Gait normal.     Deep Tendon Reflexes:     Reflex Scores:      Bicep reflexes are 2+ on the right side and 2+ on the left side.      Patellar reflexes are 2+ on the right side and 2+ on the left side.    Comments: Chvostek's and Trousseaus' signs negative.  Psychiatric:        Mood and Affect: Mood is anxious.     Comments: Well groomed, good eye contact.   ASSESSMENT AND PLAN:  Ms.Otila was seen today for tingling.  Diagnoses and all orders for this visit: Orders Placed This Encounter  Procedures   Basic metabolic panel   Calcium, ionized   Vitamin B12   Phosphorus   CBC   Lab Results  Component Value Date   WBC 8.9 02/11/2022   HGB 13.7 02/11/2022   HCT 41.1 02/11/2022   MCV 91.6 02/11/2022   PLT 363.0 02/11/2022   Lab Results  Component Value Date   CREATININE 0.71 02/11/2022   BUN 16 02/11/2022   NA 136 02/11/2022   K 4.5 02/11/2022   CL 99 02/11/2022   CO2 32 02/11/2022    Lab Results  Component Value Date   RXYVOPFY92 446 02/11/2022   Tingling of skin We discussed possible etiologies. Electrolyte imbalance and hypoCa++ after episode of diarrhea and vomiting could have been the cause. It has improved after she increase ca++ intake, taking 2 tabs at the same time, 1200 mg and 600 mg.Recommend taking 600 mg tid. Instructed about warning signs.  Hypoparathyroidism, unspecified hypoparathyroidism type ([place]) Blood work in normal range on 01/28/22. Continue calcium supplementation. Further recommendations according to lab results.  Return if symptoms worsen or fail to improve, for Keep next appt..  Kaelyn Nauta G. [place], MD  [facility]. [place] office.

## 2022-02-11 NOTE — Patient Instructions (Addendum)
A few things to remember from today's visit: Paresthesia Paresthesia is an abnormal burning or prickling sensation. It is usually felt in the hands, arms, legs, or feet. However, it may occur in any part of the body. Usually, paresthesia is not painful. It may feel like: Tingling or numbness. Buzzing. Itching. Paresthesia may occur without any clear cause, or it may be caused by: Breathing too quickly (hyperventilation). Pressure on a nerve. An underlying medical condition. Side effects of a medication. Nutritional deficiencies. Exposure to toxic chemicals. Most people experience temporary (transient) paresthesia at some time in their lives. For some people, it may be long-lasting (chronic) because of an underlying medical condition. If you have paresthesia that lasts a long time, you need to be evaluated by your health care provider. Follow these instructions at home: Alcohol use  Do not drink alcohol if: Your health care provider tells you not to drink. You are pregnant, may be pregnant, or are planning to become pregnant. If you drink alcohol: Limit how much you use to: 0-1 drink a day for women. 0-2 drinks a day for men. Be aware of how much alcohol is in your drink. In the [place] one drink equals one 12 oz bottle of beer (355 mL), one 5 oz glass of wine (148 mL), or one 1 oz glass of hard liquor (44 mL). Nutrition  Eat a healthy diet. This includes: Eating foods that are high in fiber, such as fresh fruits and vegetables, whole grains, and beans. Limiting foods that are high in fat and processed sugars, such as fried or sweet foods. General instructions Take over-the-counter and prescription medicines only as told by your health care provider. Do not use any products that contain nicotine or tobacco, such as cigarettes and e-cigarettes. These can keep blood from reaching damaged nerves. If you need help quitting, ask your health care provider. If you have diabetes, work closely  with your health care provider to keep your blood sugar under control. If you have numbness in your feet: Check every day for signs of injury or infection. Watch for redness, warmth, and swelling. Wear padded socks and comfortable shoes. These help protect your feet. Keep all follow-up visits as told by your health care provider. This is important. Contact a health care provider if you: Have paresthesia that gets worse or does not go away. Have numbness after an injury. Have a burning or prickling feeling that gets worse when you walk. Have pain, cramps, or dizziness, or you faint. Develop a rash. Get help right away if you: Feel muscle weakness. Develop new weakness in an arm or leg. Have trouble walking or moving. Have problems with speech, understanding, or vision. Feel confused. Cannot control your bladder or bowel movements. Summary Paresthesia is an abnormal burning or prickling sensation that is usually felt in the hands, arms, legs, or feet. It may also occur in other parts of the body. Paresthesia may occur without any clear cause, or it may be caused by breathing too quickly (hyperventilation), pressure on a nerve, an underlying medical condition, side effects of a medication, nutritional deficiencies, or exposure to toxic chemicals. If you have paresthesia that lasts a long time, you need to be evaluated by your health care provider. This information is not intended to replace advice given to you by your health care provider. Make sure you discuss any questions you have with your health care provider. Document Revised: 09/22/2020 Document Reviewed: 09/22/2020 Elsevier Patient Education  [place] of  skin - Plan: Basic metabolic panel, Calcium, ionized, Vitamin B12  Hypoparathyroidism, unspecified hypoparathyroidism type ([place]) - Plan: Calcium, ionized  If you need refills please call your pharmacy. Do not use My Chart to request refills or for acute issues  that need immediate attention.    Please be sure medication list is accurate. If a new problem present, please set up appointment sooner than planned today.

## 2022-02-13 LAB — CALCIUM, IONIZED: Calcium, Ion: 4.39 mg/dL — ABNORMAL LOW (ref 4.8–5.6)

## 2022-02-17 ENCOUNTER — Other Ambulatory Visit: Payer: Self-pay | Admitting: Cardiovascular Disease

## 2022-03-02 ENCOUNTER — Other Ambulatory Visit: Payer: Self-pay | Admitting: Family Medicine

## 2022-03-02 DIAGNOSIS — E890 Postprocedural hypothyroidism: Secondary | ICD-10-CM

## 2022-03-03 DIAGNOSIS — L814 Other melanin hyperpigmentation: Secondary | ICD-10-CM | POA: Diagnosis not present

## 2022-03-03 DIAGNOSIS — Z85820 Personal history of malignant melanoma of skin: Secondary | ICD-10-CM | POA: Diagnosis not present

## 2022-03-03 DIAGNOSIS — L820 Inflamed seborrheic keratosis: Secondary | ICD-10-CM | POA: Diagnosis not present

## 2022-03-03 DIAGNOSIS — L570 Actinic keratosis: Secondary | ICD-10-CM | POA: Diagnosis not present

## 2022-03-03 DIAGNOSIS — D225 Melanocytic nevi of trunk: Secondary | ICD-10-CM | POA: Diagnosis not present

## 2022-03-07 DIAGNOSIS — H16223 Keratoconjunctivitis sicca, not specified as Sjogren's, bilateral: Secondary | ICD-10-CM | POA: Diagnosis not present

## 2022-03-18 ENCOUNTER — Telehealth: Payer: Self-pay | Admitting: *Deleted

## 2022-03-18 NOTE — Telephone Encounter (Signed)
? ?  Pre-operative Risk Assessment  ?  ?Patient Name: Christina Watts  ?DOB: [birth year] ?MRN: [id number]  ? ?  ? ?Request for Surgical Clearance   ? ?Procedure:   MINI FACELIFT ? ?Date of Surgery:  Clearance 04/11/22                              ?   ?Surgeon:  DR. Shanon Brow BOWERS ?Surgeon's Group or Practice Name: [facility] ?Phone number:  [phone] ?Fax number:  [phone] ?  ?Type of Clearance Requested:   ?- Medical  ?  ?Type of Anesthesia:   0.2 MG OF EPINEPHRINE OVER  THE COURSE OF 4 HOURS FOR THIS PROCEDURE ?  ?Additional requests/questions:   ? ?Signed, ?Julaine Hua   ?03/18/2022, 12:30 PM  ? ?

## 2022-03-18 NOTE — Telephone Encounter (Signed)
Primary Cardiologist:Christina Burt Knack, MD ? ?Chart reviewed as part of pre-operative protocol coverage. Because of Christina Watts past medical history and time since last visit, he/she will require a follow-up visit in order to better assess preoperative cardiovascular risk. ? ?Pre-op covering staff: ?- Please schedule appointment and call patient to inform them. ?- Please contact requesting surgeon's office via preferred method (i.e, phone, fax) to inform them of need for appointment prior to surgery. ? ?If applicable, this message will also be routed to pharmacy pool and/or primary cardiologist for input on holding anticoagulant/antiplatelet agent as requested below so that this information is available at time of patient's appointment.  ? ?Christina Pelton, NP  ?03/18/2022, 12:52 PM  ? ?

## 2022-03-18 NOTE — Telephone Encounter (Signed)
Left message for the pt she is going to need a sooner appt for pre op clearance. Pt hs appt in may with Dr. Burt Knack though this after her procedure and she will need to be seen in the office before her procedure. Pt can see DR. Cooper or APP in the office ?

## 2022-03-22 NOTE — Telephone Encounter (Signed)
Pt has appt 03/23/22 with Richardson Dopp, PAC  ?

## 2022-03-23 ENCOUNTER — Encounter: Payer: Self-pay | Admitting: Physician Assistant

## 2022-03-23 ENCOUNTER — Ambulatory Visit: Payer: BC Managed Care – PPO | Admitting: Physician Assistant

## 2022-03-23 ENCOUNTER — Ambulatory Visit (INDEPENDENT_AMBULATORY_CARE_PROVIDER_SITE_OTHER): Payer: BC Managed Care – PPO

## 2022-03-23 VITALS — BP 142/70 | HR 68 | Ht 62.0 in | Wt 133.4 lb

## 2022-03-23 DIAGNOSIS — E7800 Pure hypercholesterolemia, unspecified: Secondary | ICD-10-CM

## 2022-03-23 DIAGNOSIS — Z720 Tobacco use: Secondary | ICD-10-CM

## 2022-03-23 DIAGNOSIS — Z01810 Encounter for preprocedural cardiovascular examination: Secondary | ICD-10-CM | POA: Diagnosis not present

## 2022-03-23 DIAGNOSIS — R002 Palpitations: Secondary | ICD-10-CM

## 2022-03-23 DIAGNOSIS — I10 Essential (primary) hypertension: Secondary | ICD-10-CM

## 2022-03-23 NOTE — Assessment & Plan Note (Signed)
BP is elevated today.  She does take a lot of BC Powders for HAs.  I have asked her to f/u with her PCP to see if there are alternative medications that can prevent HAs so that she can reduce NSAID use.  Continue Norvasc 5 mg once daily.  Monitor BP at home for 1-2 weeks and send readings via MyChart.  Consider increasing Norvasc to 10 mg once daily if needed.  ?

## 2022-03-23 NOTE — Assessment & Plan Note (Signed)
Recent LDL optimal.  She notes she changed from Simvastatin to Rosuvastatin since then.  Continue Rosuvastatin 20 mg once daily, Ezetimibe 10 mg once daily.  She wants to keep her appt with Dr. Burt Knack in May.  I have asked her to come fasting at that time to get fasting Lipids repeated.  ?

## 2022-03-23 NOTE — Progress Notes (Signed)
?Cardiology Office Note:   ? ?Date:  03/23/2022  ? ?ID:  Christina Watts, DOB 06-03-1961, MRN [id number] ? ?PCP:  Martinique, Betty G, MD  ?[facility] HeartCare Providers ?Cardiologist:  [name], MD    ?Referring MD: Martinique, Betty G, MD  ? ?Chief Complaint:  Surgical Clearance ?  ? ?Patient Profile: ?Hyperlipidemia ?Hypertension ?+Cigs ?Family history of premature CAD ?GERD ?Postsurgical hypothyroidism ?Right Bundle Branch Block  ?Cardiac cath May 2016: Normal coronary arteries ? ?Prior CV Studies: ?LEFT HEART CATH AND CORONARY ANGIOGRAPHY 05/14/2015 ?Widely patent coronary arteries ?  ?GATED SPECT MYO PERF W/EXERCISE STRESS 1D 04/29/2015 ?Low risk study.  There is a small sized, very mild, reversible defect in the basal inferoseptum that could represent a small area of ischemia but also could be due to shifting breast attenuation artifact.   Low normal LVF with EF 51%. ?   ? ?History of Present Illness:   ?Christina Watts is a 61 y.o. female with the above problem list.  She was last seen by Dr. Burt Knack in March 2022.  She returns for surgical clearance.  She plans to undergo a mini facelift with Dr. Harlow Mares at [facility] with planned local anesthesia.  She is here alone.  She is not sure if she wants to go through with surgery. She has not had chest pain, shortness of breath, syncope, leg edema.  She notes recent onset of palpitations.  She mainly notes them at night.  It feels like a skipping.  She also describes brief rapid beats.   ?   ?Past Medical History:  ?Diagnosis Date  ? Abscess, periappendiceal   ? periappendiceal adhesions periappendiceal adhesions. This was associated with  Subsequent to that time she has had repeat laparoscopies for chronic, particularly right sided, pelvic pain on two occasions, one in 2000 and again in June of 2003.  At each time, pelvic adhesions were noted but no other significant pelvic pathology  ? Adenomyosis   ? uterine adenomyosis, in 1995 total abdominal hysterectomy, right ovarian  cystectomy  ? Cancer [facility])   ? melanoma - right leg - good now  ? GERD (gastroesophageal reflux disease)   ? Headache   ? Migraines  ? Hypercholesterolemia   ? Hyperlipidemia   ? Hypothyroidism   ? Other and unspecified ovarian cysts   ? long history of recurring ovarian cysts  ? Right bundle branch block   ? Thyroid disease   ? Tobacco abuse   ? 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 Ulcer disease   ? currently asymptomatic and not on medication  ? ?Current Medications: ?Current Meds  ?Medication Sig  ? ALPRAZolam (XANAX) 0.5 MG tablet TAKE 0.5-1 TABLETS (0.25-0.5 MG TOTAL) BY MOUTH DAILY AS NEEDED FOR ANXIETY  ? amLODipine (NORVASC) 5 MG tablet TAKE 1 TABLET BY MOUTH DAILY. PLEASE KEEP UPCOMING APPT IN MARCH 2022  ? budesonide-formoterol (SYMBICORT) 160-4.5 MCG/ACT inhaler Inhale 2 puffs into the lungs 2 (two) times daily.  ? calcium carbonate (OS-CAL) 600 MG TABS Take 1,200 mg by mouth 2 (two) times daily.  ? estradiol (ESTRACE) 2 MG tablet Take 2 mg by mouth 2 (two) times daily.   ? ezetimibe (ZETIA) 10 MG tablet Take 1 tablet (10 mg total) by mouth daily.  ? fluticasone (FLONASE) 50 MCG/ACT nasal spray Place 1-2 sprays into both nostrils daily.  ? nitroGLYCERIN (NITROSTAT) 0.4 MG SL tablet Place 1 tablet (0.4 mg total) under the tongue every 5 (five) minutes as needed for  chest pain.  ? omeprazole (PRILOSEC) 20 MG capsule TAKE 1 CAPSULE BY MOUTH EVERY DAY  ? rosuvastatin (CRESTOR) 20 MG tablet TAKE 1 TABLET BY MOUTH EVERY DAY  ? sertraline (ZOLOFT) 100 MG tablet TAKE 1 TABLET BY MOUTH EVERY DAY  ? SYNTHROID 100 MCG tablet TAKE 1 TABLET BY MOUTH DAILY EXCEPT TAKE 1.5 TABLETS TUESDAY & THURSDAY BEFORE BREAKFAST.  ? XIIDRA 5 % SOLN INSTILL 1 DROP INTO BOTH EYES TWICE A DAY  ?  ?Allergies:   Promethazine hcl  ? ?Social History  ? ?Tobacco Use  ? Smoking status: Former  ?  Packs/day: 0.50  ?  Years: 19.00  ?  Pack years: 9.50  ?  Types: Cigarettes  ?  Quit date: 07/26/2021  ?  Years since quitting: 0.6  ?  Smokeless tobacco: Never  ?Substance Use Topics  ? Alcohol use: Yes  ?  Comment: Drinks wine occas  ? Drug use: No  ?  ?Family Hx: ?The patient's family history includes Asthma in her mother; Diabetes in her mother; Heart attack in her father; Heart disease in her father and another family member; Hyperlipidemia in an other family member. ? ?Review of Systems  ?Gastrointestinal:  Negative for hematochezia.  ?Genitourinary:  Negative for hematuria.   ? ?EKGs/Labs/Other Test Reviewed:   ? ?EKG:  EKG is   ordered today.  The ekg ordered today demonstrates NSR, HR 68, normal axis, inc Right Bundle Branch Block, QTc 435 ms  ? ?Recent Labs: ?10/01/2021: ALT 13 ?01/28/2022: TSH 0.53 ?02/11/2022: BUN 16; Creatinine, Ser 0.71; Hemoglobin 13.7; Platelets 363.0; Potassium 4.5; Sodium 136  ? ?Recent Lipid Panel ?Recent Labs  ?  10/01/21 ?0750  ?CHOL 142  ?TRIG 144  ?HDL 71  ?Moffat 47  ?  ? ?Risk Assessment/Calculations:   ?  ?    ?Physical Exam:   ? ?VS:  BP (!) 142/70 (BP Location: Right Arm, Patient Position: Sitting, Cuff Size: Normal)   Pulse 68   Ht '5\' 2"'$  (1.575 m)   Wt 133 lb 6.4 oz (60.5 kg)   SpO2 96%   BMI 24.40 kg/m?    ? ?Wt Readings from Last 3 Encounters:  ?03/23/22 133 lb 6.4 oz (60.5 kg)  ?02/11/22 131 lb 4 oz (59.5 kg)  ?01/28/22 131 lb 4 oz (59.5 kg)  ?  ?Constitutional:   ?   Appearance: Healthy appearance. Not in distress.  ?Neck:  ?   Vascular: No JVR. JVD normal.  ?Pulmonary:  ?   Effort: Pulmonary effort is normal.  ?   Breath sounds: No wheezing. No rales.  ?Cardiovascular:  ?   Normal rate. Regular rhythm. Normal S1. Normal S2.   ?   Murmurs: There is no murmur.  ?Edema: ?   Peripheral edema absent.  ?Abdominal:  ?   Palpations: Abdomen is soft.  ?Skin: ?   General: Skin is warm and dry.  ?Neurological:  ?   Mental Status: Alert and oriented to person, place and time.  ?   Cranial Nerves: Cranial nerves are intact.  ?  ?    ?ASSESSMENT & PLAN:   ?Preoperative cardiovascular examination ?Ms. Allport's  perioperative risk of a major cardiac event is 0.4% according to the Revised Cardiac Risk Index (RCRI).  Therefore, she is at low risk for perioperative complications.   Her functional capacity is good at 4.31 METs according to the Duke Activity Status Index (DASI). ?Recommendations: ?According to ACC/AHA guidelines, no further cardiovascular testing needed.  The patient may  proceed to surgery at acceptable risk.   ?Antiplatelet and/or Anticoagulation Recommendations: ?The pt does not take any antiplatelet medications. ? ?Essential hypertension, benign ?BP is elevated today.  She does take a lot of BC Powders for HAs.  I have asked her to f/u with her PCP to see if there are alternative medications that can prevent HAs so that she can reduce NSAID use.  Continue Norvasc 5 mg once daily.  Monitor BP at home for 1-2 weeks and send readings via MyChart.  Consider increasing Norvasc to 10 mg once daily if needed.  ? ?Pure hypercholesterolemia ?Recent LDL optimal.  She notes she changed from Simvastatin to Rosuvastatin since then.  Continue Rosuvastatin 20 mg once daily, Ezetimibe 10 mg once daily.  She wants to keep her appt with Dr. Burt Knack in May.  I have asked her to come fasting at that time to get fasting Lipids repeated.  ? ?Palpitations ?She notes palpitations at night.  She has a hx of hypoparathyroidism since her thyroid surgery. Her calcium is somewhat low.  She remains on supplementation.  Recent TSH, K+ normal.  No arrhythmias on EKG today.   ?Arrange Zio XT 14 day monitor  ? ?Tobacco abuse disorder ?She has quit smoking. ?  ?     ?   ?Dispo:  Return in about 7 weeks (around 05/09/2022) for Scheduled Follow Up, w/ Dr. Burt Knack.  ? ?Medication Adjustments/Labs and Tests Ordered: ?Current medicines are reviewed at length with the patient today.  Concerns regarding medicines are outlined above.  ?Tests Ordered: ?Orders Placed This Encounter  ?Procedures  ? LONG TERM MONITOR (3-14 DAYS)  ? EKG 12-Lead  ? ?Medication  Changes: ?No orders of the defined types were placed in this encounter. ? ?Signed, ?Richardson Dopp, PA-C  ?03/23/2022 2:47 PM    ?[facility] ?[address], [place], [place]  [phone] ?Phone

## 2022-03-23 NOTE — Patient Instructions (Addendum)
Medication Instructions:  ?Your physician recommends that you continue on your current medications as directed. Please refer to the Current Medication list given to you today. ? ?*If you need a refill on your cardiac medications before your next appointment, please call your pharmacy* ? ? ?Lab Work: ?None ordered ? ?If you have labs (blood work) drawn today and your tests are completely normal, you will receive your results only by: ?MyChart Message (if you have MyChart) OR ?A paper copy in the mail ?If you have any lab test that is abnormal or we need to change your treatment, we will call you to review the results. ? ? ?Testing/Procedures: ?ZIO XT- Long Term Monitor Instructions ? ?Your physician has requested you wear a ZIO patch monitor for 14 days.  ?This is a single patch monitor. [employer] supplies one patch monitor per enrollment. Additional ?stickers are not available. Please do not apply patch if you will be having a Nuclear Stress Test,  ?Echocardiogram, Cardiac CT, MRI, or Chest Xray during the period you would be wearing the  ?monitor. The patch cannot be worn during these tests. You cannot remove and re-apply the  ?ZIO XT patch monitor.  ?Your ZIO patch monitor will be mailed 3 day USPS to your address on file. It may take 3-5 days  ?to receive your monitor after you have been enrolled.  ?Once you have received your monitor, please review the enclosed instructions. Your monitor  ?has already been registered assigning a specific monitor serial # to you. ? ?Billing and Patient Assistance Program Information ? ?We have supplied [employer] with any of your insurance information on file for billing purposes. ?[employer] offers a sliding scale Patient Assistance Program for patients that do not have  ?insurance, or whose insurance does not completely cover the cost of the ZIO monitor.  ?You must apply for the Patient Assistance Program to qualify for this discounted rate.  ?To apply, please call [employer] at  [phone], select option 4, select option 2, ask to apply for  ?Patient Assistance Program. Theodore Demark will ask your household income, and how many people  ?are in your household. They will quote your out-of-pocket cost based on that information.  ?[employer] will also be able to set up a 38-month interest-free payment plan if needed. ? ?Applying the monitor ?  ?Shave hair from upper left chest.  ?Hold abrader disc by orange tab. Rub abrader in 40 strokes over the upper left chest as  ?indicated in your monitor instructions.  ?Clean area with 4 enclosed alcohol pads. Let dry.  ?Apply patch as indicated in monitor instructions. Patch will be placed under collarbone on left  ?side of chest with arrow pointing upward.  ?Rub patch adhesive wings for 2 minutes. Remove white label marked "1". Remove the white  ?label marked "2". Rub patch adhesive wings for 2 additional minutes.  ?While looking in a mirror, press and release button in center of patch. A small green light will  ?flash 3-4 times. This will be your only indicator that the monitor has been turned on.  ?Do not shower for the first 24 hours. You may shower after the first 24 hours.  ?Press the button if you feel a symptom. You will hear a small click. Record Date, Time and  ?Symptom in the Patient Logbook.  ?When you are ready to remove the patch, follow instructions on the last 2 pages of Patient  ?Logbook. Stick patch monitor onto the last page of Patient Logbook.  ?Place Patient  Logbook in the blue and white box. Use locking tab on box and tape box closed  ?securely. The blue and white box has prepaid postage on it. Please place it in the mailbox as  ?soon as possible. Your physician should have your test results approximately 7 days after the  ?monitor has been mailed back to [facility].  ?Call [facility] at [phone] if you have questions regarding  ?your ZIO XT patch monitor. Call them immediately if you see an orange light  blinking on your  ?monitor.  ?If your monitor falls off in less than 4 days, contact our Monitor department at [phone].  ?If your monitor becomes loose or falls off after 4 days call [employer] at [phone] for  ?suggestions on securing your monitor ? ? ?Follow-Up: ?At [facility], you and your health needs are our priority.  As part of our continuing mission to provide you with exceptional heart care, we have created designated Provider Care Teams.  These Care Teams include your primary Cardiologist (physician) and Advanced Practice Providers (APPs -  Physician Assistants and Nurse Practitioners) who all work together to provide you with the care you need, when you need it. ? ?We recommend signing up for the patient portal called "MyChart".  Sign up information is provided on this After Visit Summary.  MyChart is used to connect with patients for Virtual Visits (Telemedicine).  Patients are able to view lab/test results, encounter notes, upcoming appointments, etc.  Non-urgent messages can be sent to your provider as well.   ?To learn more about what you can do with MyChart, go to [email or web address].   ? ?Your next appointment:   ?As scheduled  ? ?The format for your next appointment:   ?In Person ? ?Provider:   ?[name], MD   ? ?Other Instructions ? ?Monitor your blood pressure for 1-2 weeks and send in the readings via mychart or calling the office. ?

## 2022-03-23 NOTE — Progress Notes (Unsigned)
Applied a 14 day Zio XT monitor to patient in the office ? ?Dr Burt Knack to read ?

## 2022-03-23 NOTE — Assessment & Plan Note (Addendum)
Christina Watts perioperative risk of a major cardiac event is 0.4% according to the Revised Cardiac Risk Index (RCRI).  Therefore, she is at low risk for perioperative complications.   Her functional capacity is good at 4.31 METs according to the Duke Activity Status Index (DASI). ?Recommendations: ?According to ACC/AHA guidelines, no further cardiovascular testing needed.  The patient may proceed to surgery at acceptable risk.   ?Antiplatelet and/or Anticoagulation Recommendations: ?The pt does not take any antiplatelet medications. ?

## 2022-03-23 NOTE — Assessment & Plan Note (Signed)
She notes palpitations at night.  She has a hx of hypoparathyroidism since her thyroid surgery. Her calcium is somewhat low.  She remains on supplementation.  Recent TSH, K+ normal.  No arrhythmias on EKG today.   ?? Arrange Zio XT 14 day monitor  ?

## 2022-03-23 NOTE — Assessment & Plan Note (Signed)
She has quit smoking 

## 2022-03-24 NOTE — Telephone Encounter (Signed)
Notes faxed to surgeon ?Richardson Dopp, PA-C    ?03/24/2022 12:22 PM   ?

## 2022-03-25 ENCOUNTER — Ambulatory Visit: Payer: BC Managed Care – PPO | Admitting: Family Medicine

## 2022-04-06 ENCOUNTER — Other Ambulatory Visit: Payer: Self-pay | Admitting: Cardiovascular Disease

## 2022-04-07 MED ORDER — AMLODIPINE BESYLATE 10 MG PO TABS: 10.0000 mg | ORAL_TABLET | Freq: Every day | ORAL | 3 refills | Status: DC

## 2022-04-07 NOTE — Telephone Encounter (Signed)
BP above goal. ? ?PLAN: ?-Increase Amlodipine to 10 mg once daily. ?Richardson Dopp, PA-C    ?04/07/2022 3:03 PM   ?

## 2022-05-09 ENCOUNTER — Encounter: Payer: Self-pay | Admitting: Cardiovascular Disease

## 2022-05-09 ENCOUNTER — Ambulatory Visit: Payer: BC Managed Care – PPO | Admitting: Cardiovascular Disease

## 2022-05-09 VITALS — BP 140/90 | HR 66 | Ht 62.0 in | Wt 134.2 lb

## 2022-05-09 DIAGNOSIS — Z720 Tobacco use: Secondary | ICD-10-CM | POA: Diagnosis not present

## 2022-05-09 DIAGNOSIS — E782 Mixed hyperlipidemia: Secondary | ICD-10-CM

## 2022-05-09 DIAGNOSIS — Z79899 Other long term (current) drug therapy: Secondary | ICD-10-CM

## 2022-05-09 DIAGNOSIS — I10 Essential (primary) hypertension: Secondary | ICD-10-CM | POA: Diagnosis not present

## 2022-05-09 DIAGNOSIS — R002 Palpitations: Secondary | ICD-10-CM

## 2022-05-09 MED ORDER — LOSARTAN POTASSIUM 50 MG PO TABS: 50.0000 mg | ORAL_TABLET | Freq: Every day | ORAL | 3 refills | Status: DC

## 2022-05-09 NOTE — Patient Instructions (Signed)
Medication Instructions:  ?START Losartan '50mg'$  Daily ?*If you need a refill on your cardiac medications before your next appointment, please call your pharmacy* ? ? ?Lab Work: ?BMET in 2-3 weeks ?If you have labs (blood work) drawn today and your tests are completely normal, you will receive your results only by: ?MyChart Message (if you have MyChart) OR ?A paper copy in the mail ?If you have any lab test that is abnormal or we need to change your treatment, we will call you to review the results. ? ? ?Testing/Procedures: ?NONE ? ? ?Follow-Up: ?At [facility], you and your health needs are our priority.  As part of our continuing mission to provide you with exceptional heart care, we have created designated Provider Care Teams.  These Care Teams include your primary Cardiologist (physician) and Advanced Practice Providers (APPs -  Physician Assistants and Nurse Practitioners) who all work together to provide you with the care you need, when you need it. ? ? ?Your next appointment:   ?1 year(s) ? ?The format for your next appointment:   ?In Person ? ?Provider:   ?[name], MD   ? ?  ? ?Important Information About Sugar ? ? ? ? ?  ?

## 2022-05-09 NOTE — Progress Notes (Signed)
?Cardiology Office Note:   ? ?Date:  05/09/2022  ? ?ID:  Christina Watts, DOB 03-06-1961, MRN [id number] ? ?PCP:  Martinique, Betty G, MD ?  ?[facility] ?Cardiologist:  [name], MD    ? ?Referring MD: Martinique, Betty G, MD  ? ?Chief Complaint  ?Patient presents with  ? Palpitations  ? ? ?History of Present Illness:   ? ?Christina Watts is a 61 y.o. female with a hx of: ?Hyperlipidemia ?Hypertension ?+Cigs ?Family history of premature CAD ?GERD ?Postsurgical hypothyroidism ?Right Bundle Branch Block  ?Cardiac cath May 2016: Normal coronary arteries ? ?She's here alone today. She quit smoking in August 2022.  She is doing well with no chest pain or shortness of breath.  She denies calf claudication symptoms.  She denies orthopnea, PND, or leg swelling.  She continues to have some heart palpitations at times but these have not changed in frequency or intensity. ? ?Past Medical History:  ?Diagnosis Date  ? Abscess, periappendiceal   ? periappendiceal adhesions periappendiceal adhesions. This was associated with  Subsequent to that time she has had repeat laparoscopies for chronic, particularly right sided, pelvic pain on two occasions, one in 2000 and again in June of 2003.  At each time, pelvic adhesions were noted but no other significant pelvic pathology  ? Adenomyosis   ? uterine adenomyosis, in 1995 total abdominal hysterectomy, right ovarian cystectomy  ? Cancer [facility])   ? melanoma - right leg - good now  ? GERD (gastroesophageal reflux disease)   ? Headache   ? Migraines  ? Hypercholesterolemia   ? Hyperlipidemia   ? Hypothyroidism   ? Other and unspecified ovarian cysts   ? long history of recurring ovarian cysts  ? Right bundle branch block   ? Thyroid disease   ? Tobacco abuse   ? 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 Ulcer disease   ? currently asymptomatic and not on medication  ? ? ?Past Surgical History:  ?Procedure Laterality Date  ? ABDOMINAL HYSTERECTOMY    ? APPENDECTOMY    ? for  obliteration of the appendiceal tip  ? BREAST BIOPSY  02/1992  ? biopsy of benign breast mass  ? CARDIAC CATHETERIZATION N/A 05/14/2015  ? Procedure: Left Heart Cath and Coronary Angiography;  Surgeon: [name], MD;  Location: [place] [facility];  Service: Cardiovascular;  Laterality: N/A;  ? ESOPHAGOGASTRODUODENOSCOPY  12/06/2005  ? Normal esophagogastroduodenoscopy --  Earle Gell, M.D.  ? HARDWARE REMOVAL Left 04/19/2017  ? Procedure: HARDWARE REMOVAL LEFT ANKLE;  Surgeon: Earlie Server, MD;  Location: [place];  Service: Orthopedics;  Laterality: Left;  ? left ankle surgery    ? OVARIAN CYST REMOVAL  1995  ? TOTAL ABDOMINAL HYSTERECTOMY W/ BILATERAL SALPINGOOPHORECTOMY  1995  ? TOTAL THYROIDECTOMY  05/13/2004  ? Multiple thyroid nodules, dominant mass, left thyroid lobe -- by, Earnstine Regal, M.D  ? TUBAL LIGATION  1989  ? ? ?Current Medications: ?Current Meds  ?Medication Sig  ? ALPRAZolam (XANAX) 0.5 MG tablet TAKE 0.5-1 TABLETS (0.25-0.5 MG TOTAL) BY MOUTH DAILY AS NEEDED FOR ANXIETY  ? amLODipine (NORVASC) 10 MG tablet Take 1 tablet (10 mg total) by mouth daily.  ? budesonide-formoterol (SYMBICORT) 160-4.5 MCG/ACT inhaler Inhale 2 puffs into the lungs 2 (two) times daily.  ? calcium carbonate (OS-CAL) 600 MG TABS Take 1,200 mg by mouth 2 (two) times daily.  ? estradiol (ESTRACE) 2 MG tablet Take 2 mg by mouth  2 (two) times daily.   ? ezetimibe (ZETIA) 10 MG tablet Take 1 tablet (10 mg total) by mouth daily.  ? fluticasone (FLONASE) 50 MCG/ACT nasal spray Place 1-2 sprays into both nostrils daily.  ? losartan (COZAAR) 50 MG tablet Take 1 tablet (50 mg total) by mouth daily.  ? nitroGLYCERIN (NITROSTAT) 0.4 MG SL tablet Place 1 tablet (0.4 mg total) under the tongue every 5 (five) minutes as needed for chest pain.  ? omeprazole (PRILOSEC) 20 MG capsule TAKE 1 CAPSULE BY MOUTH EVERY DAY  ? rosuvastatin (CRESTOR) 20 MG tablet TAKE 1 TABLET BY MOUTH EVERY DAY  ? sertraline (ZOLOFT) 100 MG  tablet TAKE 1 TABLET BY MOUTH EVERY DAY  ? SYNTHROID 100 MCG tablet TAKE 1 TABLET BY MOUTH DAILY EXCEPT TAKE 1.5 TABLETS TUESDAY & THURSDAY BEFORE BREAKFAST.  ? XIIDRA 5 % SOLN INSTILL 1 DROP INTO BOTH EYES TWICE A DAY  ?  ? ?Allergies:   Promethazine hcl  ? ?Social History  ? ?Socioeconomic History  ? Marital status: Married  ?  Spouse name: Not on file  ? Number of children: Not on file  ? Years of education: Not on file  ? Highest education level: 12th grade  ?Occupational History  ? Not on file  ?Tobacco Use  ? Smoking status: Former  ?  Packs/day: 0.50  ?  Years: 19.00  ?  Pack years: 9.50  ?  Types: Cigarettes  ?  Quit date: 07/26/2021  ?  Years since quitting: 0.7  ? Smokeless tobacco: Never  ?Substance and Sexual Activity  ? Alcohol use: Yes  ?  Comment: Drinks wine occas  ? Drug use: No  ? Sexual activity: Yes  ?  Birth control/protection: Surgical  ?Other Topics Concern  ? Not on file  ?Social History Narrative  ? She is married with three children, and she has five sibling who are all still living.  ? ?Social Determinants of Health  ? ?Financial Resource Strain: Low Risk   ? Difficulty of Paying Living Expenses: Not hard at all  ?Food Insecurity: No Food Insecurity  ? Worried About Charity fundraiser in the Last Year: Never true  ? Ran Out of Food in the Last Year: Never true  ?Transportation Needs: No Transportation Needs  ? Lack of Transportation (Medical): No  ? Lack of Transportation (Non-Medical): No  ?Physical Activity: Insufficiently Active  ? Days of Exercise per Week: 3 days  ? Minutes of Exercise per Session: 30 min  ?Stress: No Stress Concern Present  ? Feeling of Stress : Not at all  ?Social Connections: Unknown  ? Frequency of Communication with Friends and Family: More than three times a week  ? Frequency of Social Gatherings with Friends and Family: Once a week  ? Attends Religious Services: Patient refused  ? Active Member of Clubs or Organizations: No  ? Attends Archivist  Meetings: Not on file  ? Marital Status: Married  ?  ? ?Family History: ?The patient's family history includes Asthma in her mother; Diabetes in her mother; Heart attack in her father; Heart disease in her father and another family member; Hyperlipidemia in an other family member. ? ?ROS:   ?Please see the history of present illness.    ?All other systems reviewed and are negative. ? ?EKGs/Labs/Other Studies Reviewed:   ? ?The following studies were reviewed today: ?Event monitor 04/06/2022: ?Patient had a min HR of 52 bpm, max HR of 169 bpm, and avg HR  of 74 bpm. Predominant underlying rhythm was Sinus Rhythm. 5 Supraventricular Tachycardia runs occurred, the run with the fastest interval lasting 7 beats with a max rate of 169 bpm, the  ?longest lasting 6 beats with an avg rate of 111 bpm. Isolated SVEs were rare (<1.0%), SVE Couplets were rare (<1.0%), and SVE Triplets were rare (<1.0%). Isolated VEs were rare (<1.0%, 15), VE Triplets were rare (<1.0%, 1), and no VE Couplets were  ?present.  ?  ?SUMMARY: ?The basic rhythm is normal sinus with an average HR of 74 bpm. There are rare supraventricular runs limited to short runs of no more than 7 beats. No atrial fibrillation, bradycardic events, or other clinically significant arrhythmias.  ? ?EKG:  EKG is ordered today.  The ekg ordered today demonstrates normal sinus rhythm 66 bpm, within normal limits. ? ?Recent Labs: ?10/01/2021: ALT 13 ?01/28/2022: TSH 0.53 ?02/11/2022: BUN 16; Creatinine, Ser 0.71; Hemoglobin 13.7; Platelets 363.0; Potassium 4.5; Sodium 136  ?Recent Lipid Panel ?   ?Component Value Date/Time  ? CHOL 142 10/01/2021 0750  ? TRIG 144 10/01/2021 0750  ? HDL 71 10/01/2021 0750  ? CHOLHDL 2.0 10/01/2021 0750  ? CHOLHDL 2 01/26/2021 0900  ? VLDL 21.4 01/26/2021 0900  ? [place] 47 10/01/2021 0750  ? ? ? ?Risk Assessment/Calculations:   ?  ? ?    ? ?Physical Exam:   ? ?VS:  BP 140/90   Pulse 66   Ht '5\' 2"'$  (1.575 m)   Wt 134 lb 3.2 oz (60.9 kg)   SpO2 99%    BMI 24.55 kg/m?    ? ?Wt Readings from Last 3 Encounters:  ?05/09/22 134 lb 3.2 oz (60.9 kg)  ?03/23/22 133 lb 6.4 oz (60.5 kg)  ?02/11/22 131 lb 4 oz (59.5 kg)  ?  ? ?GEN:  Well nourished, well developed in no acu

## 2022-06-01 ENCOUNTER — Other Ambulatory Visit: Payer: Self-pay | Admitting: Family Medicine

## 2022-06-03 ENCOUNTER — Other Ambulatory Visit: Payer: Self-pay

## 2022-06-06 ENCOUNTER — Other Ambulatory Visit: Payer: BC Managed Care – PPO

## 2022-06-07 ENCOUNTER — Other Ambulatory Visit: Payer: BC Managed Care – PPO

## 2022-06-07 DIAGNOSIS — Z79899 Other long term (current) drug therapy: Secondary | ICD-10-CM

## 2022-06-07 LAB — BASIC METABOLIC PANEL
BUN/Creatinine Ratio: 28 (ref 12–28)
BUN: 17 mg/dL (ref 8–27)
CO2: 27 mmol/L (ref 20–29)
Calcium: 9 mg/dL (ref 8.7–10.3)
Chloride: 98 mmol/L (ref 96–106)
Creatinine, Ser: 0.6 mg/dL (ref 0.57–1.00)
Glucose: 90 mg/dL (ref 70–99)
Potassium: 4.7 mmol/L (ref 3.5–5.2)
Sodium: 138 mmol/L (ref 134–144)
eGFR: 101 mL/min/{1.73_m2} (ref >59)

## 2022-06-16 ENCOUNTER — Other Ambulatory Visit: Payer: Self-pay | Admitting: Family Medicine

## 2022-06-16 DIAGNOSIS — F419 Anxiety disorder, unspecified: Secondary | ICD-10-CM

## 2022-06-17 ENCOUNTER — Telehealth: Payer: Self-pay

## 2022-06-17 NOTE — Telephone Encounter (Signed)
-   last filled 05/11/22

## 2022-06-28 ENCOUNTER — Ambulatory Visit: Admit: 2022-06-28 | Payer: BC Managed Care – PPO | Admitting: Orthopedic Surgery

## 2022-06-28 SURGERY — IRRIGATION AND DEBRIDEMENT SHOULDER
Anesthesia: Choice | Site: Shoulder | Laterality: Left

## 2022-07-05 ENCOUNTER — Other Ambulatory Visit: Payer: Self-pay | Admitting: Cardiovascular Disease

## 2022-08-16 ENCOUNTER — Encounter: Payer: Self-pay | Admitting: Family Medicine

## 2022-08-30 ENCOUNTER — Other Ambulatory Visit: Payer: Self-pay

## 2022-09-01 DIAGNOSIS — Z85820 Personal history of malignant melanoma of skin: Secondary | ICD-10-CM | POA: Diagnosis not present

## 2022-09-01 DIAGNOSIS — D225 Melanocytic nevi of trunk: Secondary | ICD-10-CM | POA: Diagnosis not present

## 2022-09-01 DIAGNOSIS — D2239 Melanocytic nevi of other parts of face: Secondary | ICD-10-CM | POA: Diagnosis not present

## 2022-09-01 DIAGNOSIS — L570 Actinic keratosis: Secondary | ICD-10-CM | POA: Diagnosis not present

## 2022-09-01 DIAGNOSIS — L814 Other melanin hyperpigmentation: Secondary | ICD-10-CM | POA: Diagnosis not present

## 2022-09-12 NOTE — Progress Notes (Unsigned)
ACUTE VISIT No chief complaint on file.  HPI: [name] is a 61 y.o. female, who is here today complaining of *** HPI  Review of Systems Rest see pertinent positives and negatives per HPI.  Current Outpatient Medications on File Prior to Visit  Medication Sig Dispense Refill  . ALPRAZolam (XANAX) 0.5 MG tablet TAKE 0.5-1 TABLETS (0.25-0.5 MG TOTAL) BY MOUTH DAILY AS NEEDED FOR ANXIETY 30 tablet 2  . amLODipine (NORVASC) 10 MG tablet Take 1 tablet (10 mg total) by mouth daily. 90 tablet 3  . budesonide-formoterol (SYMBICORT) 160-4.5 MCG/ACT inhaler Inhale 2 puffs into the lungs 2 (two) times daily. 1 each 3  . calcium carbonate (OS-CAL) 600 MG TABS Take 1,200 mg by mouth 2 (two) times daily.    Marland Kitchen estradiol (ESTRACE) 2 MG tablet Take 2 mg by mouth 2 (two) times daily.     Marland Kitchen ezetimibe (ZETIA) 10 MG tablet TAKE 1 TABLET BY MOUTH EVERY DAY 90 tablet 2  . fluticasone (FLONASE) 50 MCG/ACT nasal spray Place 1-2 sprays into both nostrils daily. 48 mL 1  . losartan (COZAAR) 50 MG tablet Take 1 tablet (50 mg total) by mouth daily. 90 tablet 3  . nitroGLYCERIN (NITROSTAT) 0.4 MG SL tablet Place 1 tablet (0.4 mg total) under the tongue every 5 (five) minutes as needed for chest pain. 75 tablet 1  . omeprazole (PRILOSEC) 20 MG capsule TAKE 1 CAPSULE BY MOUTH EVERY DAY 90 capsule 3  . rosuvastatin (CRESTOR) 20 MG tablet TAKE 1 TABLET BY MOUTH EVERY DAY 90 tablet 2  . sertraline (ZOLOFT) 100 MG tablet TAKE 1 TABLET BY MOUTH EVERY DAY 90 tablet 3  . SYNTHROID 100 MCG tablet TAKE 1 TABLET BY MOUTH DAILY EXCEPT TAKE 1.5 TABLETS TUESDAY & THURSDAY BEFORE BREAKFAST. 90 tablet 3  . XIIDRA 5 % SOLN INSTILL 1 DROP INTO BOTH EYES TWICE A DAY     No current facility-administered medications on file prior to visit.     Past Medical History:  Diagnosis Date  . 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 This was associated with  Subsequent to that time she has  had repeat laparoscopies for chronic, particularly right sided, pelvic pain on two occasions, one in 2000 and again in June of 2003.  At each time, pelvic adhesions were noted but no other significant pelvic pathology  . Adenomyosis    uterine adenomyosis, in 1995 total abdominal hysterectomy, right ovarian cystectomy  . Cancer ([place])    melanoma - right leg - good now  . GERD (gastroesophageal reflux disease)   . Headache    Migraines  . Hypercholesterolemia   . Hyperlipidemia   . Hypothyroidism   . Other and unspecified ovarian cysts    long history of recurring ovarian cysts  . Right bundle branch block   . Thyroid disease   . Tobacco abuse   . 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 Ulcer disease    currently asymptomatic and not on medication   Allergies  Allergen Reactions  . Promethazine Hcl Anaphylaxis, Hives and Other (See Comments)    Lungs swell and cant breathe    Social History   Socioeconomic History  . Marital status: Married    Spouse name: Not on file  . Number of children: Not on file  . Years of education: Not on file  . Highest education level: 12th grade  Occupational History  . Not on file  Tobacco  Use  . Smoking status: Former    Packs/day: 0.50    Years: 19.00    Total pack years: 9.50    Types: Cigarettes    Quit date: 07/26/2021    Years since quitting: 1.1  . Smokeless tobacco: Never  Substance and Sexual Activity  . Alcohol use: Yes    Comment: Drinks wine occas  . Drug use: No  . Sexual activity: Yes    Birth control/protection: Surgical  Other Topics Concern  . Not on file  Social History Narrative   She is married with three children, and she has five sibling who are all still living.   Social Determinants of Health   Financial Resource Strain: Low Risk  (02/08/2022)   Overall Financial Resource Strain (CARDIA)   . Difficulty of Paying Living Expenses: Not hard at all  Food Insecurity: No Food Insecurity (02/08/2022)    Hunger Vital Sign   . Worried About Charity fundraiser in the Last Year: Never true   . Ran Out of Food in the Last Year: Never true  Transportation Needs: No Transportation Needs (02/08/2022)   PRAPARE - Transportation   . Lack of Transportation (Medical): No   . Lack of Transportation (Non-Medical): No  Physical Activity: Insufficiently Active (02/08/2022)   Exercise Vital Sign   . Days of Exercise per Week: 3 days   . Minutes of Exercise per Session: 30 min  Stress: No Stress Concern Present (02/08/2022)   [place]   . Feeling of Stress : Not at all  Social Connections: Unknown (02/08/2022)   Social Connection and Isolation Panel [NHANES]   . Frequency of Communication with Friends and Family: More than three times a week   . Frequency of Social Gatherings with Friends and Family: Once a week   . Attends Religious Services: Patient refused   . Active Member of Clubs or Organizations: No   . Attends Archivist Meetings: Not on file   . Marital Status: Married    There were no vitals filed for this visit. There is no height or weight on file to calculate BMI.  Physical Exam  ASSESSMENT AND PLAN:  There are no diagnoses linked to this encounter.   No follow-ups on file.   Anyae Griffith G. Martinique, MD  [facility]. [place] office.  Discharge Instructions   None

## 2022-09-13 ENCOUNTER — Encounter: Payer: Self-pay | Admitting: Family Medicine

## 2022-09-13 ENCOUNTER — Telehealth (INDEPENDENT_AMBULATORY_CARE_PROVIDER_SITE_OTHER): Payer: BC Managed Care – PPO | Admitting: Family Medicine

## 2022-09-13 VITALS — Temp 98.0°F

## 2022-09-13 DIAGNOSIS — U071 COVID-19: Secondary | ICD-10-CM

## 2022-09-13 DIAGNOSIS — R051 Acute cough: Secondary | ICD-10-CM | POA: Diagnosis not present

## 2022-09-13 DIAGNOSIS — J029 Acute pharyngitis, unspecified: Secondary | ICD-10-CM

## 2022-09-13 LAB — POCT INFLUENZA A/B
Influenza A, POC: NEGATIVE
Influenza B, POC: NEGATIVE

## 2022-09-13 LAB — POC COVID19 BINAXNOW: SARS Coronavirus 2 Ag: POSITIVE — AB

## 2022-09-13 LAB — POCT RAPID STREP A (OFFICE): Rapid Strep A Screen: NEGATIVE

## 2022-09-13 MED ORDER — HYDROCODONE BIT-HOMATROP MBR 5-1.5 MG/5ML PO SOLN: 5.0000 mL | Freq: Two times a day (BID) | ORAL | 0 refills | Status: AC | PRN

## 2022-09-13 MED ORDER — MOLNUPIRAVIR EUA 200MG CAPSULE: 4.0000 | ORAL_CAPSULE | Freq: Two times a day (BID) | ORAL | 0 refills | Status: AC

## 2022-10-07 ENCOUNTER — Encounter: Payer: Self-pay | Admitting: Family Medicine

## 2022-10-07 ENCOUNTER — Ambulatory Visit (INDEPENDENT_AMBULATORY_CARE_PROVIDER_SITE_OTHER): Payer: BC Managed Care – PPO | Admitting: Family Medicine

## 2022-10-07 VITALS — BP 118/70 | HR 74 | Temp 98.7°F | Resp 12 | Ht 62.0 in | Wt 134.2 lb

## 2022-10-07 DIAGNOSIS — G43009 Migraine without aura, not intractable, without status migrainosus: Secondary | ICD-10-CM | POA: Insufficient documentation

## 2022-10-07 DIAGNOSIS — L299 Pruritus, unspecified: Secondary | ICD-10-CM | POA: Diagnosis not present

## 2022-10-07 DIAGNOSIS — F419 Anxiety disorder, unspecified: Secondary | ICD-10-CM | POA: Diagnosis not present

## 2022-10-07 DIAGNOSIS — Z23 Encounter for immunization: Secondary | ICD-10-CM | POA: Diagnosis not present

## 2022-10-07 MED ORDER — FLUOCINOLONE ACETONIDE 0.01 % OT OIL: 1.0000 [drp] | TOPICAL_OIL | Freq: Two times a day (BID) | OTIC | 0 refills | Status: DC

## 2022-10-07 NOTE — Patient Instructions (Addendum)
A few things to remember from today's visit:   Ear itching - Plan: Fluocinolone Acetonide 0.01 % OIL  Anxiety disorder, unspecified type  1 drop daily for 7 days then as needed. Monitor for new symptoms.  If you need refills for medications you take chronically, please call your pharmacy. Do not use My Chart to request refills or for acute issues that need immediate attention. If you send a my chart message, it may take a few days to be addressed, specially if I am not in the office.  Please be sure medication list is accurate. If a new problem present, please set up appointment sooner than planned today.

## 2022-10-07 NOTE — Progress Notes (Unsigned)
ACUTE VISIT No chief complaint on file.  HPI: [name] is a 61 y.o. female with medical history significant for hypothyroidism, hyperparathyroidism, GERD, hypertension, hyperlipidemia, palpitations, and anxiety here today complaining of *** HPI The patient, Christina Watts., presents with a chief complaint of discomfort and tickling sensation in the ear for the past three weeks. The patient reports that the sensation is not painful but is very annoying, occurring approximately 10 times a day. The patient denies any drainage, changes in hearing, or ringing in the ear.  Christina Watts. also reports increased anxiety and has been attending counseling sessions once a week. The patient has been taking Xanax to manage anxiety symptoms. The patient has a history of family-related stress and has been experiencing emotional distress due to recent family conflicts.  The patient has a history of migraine headaches and has been prescribed Butalbital by their previous GYN, Dr. Frankey Poot, who has since retired. The patient describes the headaches as bi-temporal and parietal, sometimes accompanied by nausea, vomiting, and sensitivity to light.  Christina Watts. has a history of hysterectomy and is currently on estradiol due to the absence of ovaries. The patient requests to have their GYN care transferred to the current provider since their previous GYN has retired. Review of Systems Rest see pertinent positives and negatives per HPI.  Current Outpatient Medications on File Prior to Visit  Medication Sig Dispense Refill   ALPRAZolam (XANAX) 0.5 MG tablet TAKE 0.5-1 TABLETS (0.25-0.5 MG TOTAL) BY MOUTH DAILY AS NEEDED FOR ANXIETY 30 tablet 2   amLODipine (NORVASC) 10 MG tablet Take 1 tablet (10 mg total) by mouth daily. 90 tablet 3   budesonide-formoterol (SYMBICORT) 160-4.5 MCG/ACT inhaler Inhale 2 puffs into the lungs 2 (two) times daily. (Patient not taking: Reported on 09/13/2022) 1 each 3   calcium carbonate (OS-CAL)  600 MG TABS Take 1,200 mg by mouth 2 (two) times daily.     estradiol (ESTRACE) 2 MG tablet Take 2 mg by mouth 2 (two) times daily.      ezetimibe (ZETIA) 10 MG tablet TAKE 1 TABLET BY MOUTH EVERY DAY 90 tablet 2   fluticasone (FLONASE) 50 MCG/ACT nasal spray Place 1-2 sprays into both nostrils daily. (Patient not taking: Reported on 09/13/2022) 48 mL 1   losartan (COZAAR) 50 MG tablet Take 1 tablet (50 mg total) by mouth daily. 90 tablet 3   LOTEMAX SM 0.38 % GEL Apply 1 drop to eye daily.     nitroGLYCERIN (NITROSTAT) 0.4 MG SL tablet Place 1 tablet (0.4 mg total) under the tongue every 5 (five) minutes as needed for chest pain. (Patient not taking: Reported on 09/13/2022) 75 tablet 1   omeprazole (PRILOSEC) 20 MG capsule TAKE 1 CAPSULE BY MOUTH EVERY DAY 90 capsule 3   rosuvastatin (CRESTOR) 20 MG tablet TAKE 1 TABLET BY MOUTH EVERY DAY (Patient not taking: Reported on 09/13/2022) 90 tablet 2   sertraline (ZOLOFT) 100 MG tablet TAKE 1 TABLET BY MOUTH EVERY DAY 90 tablet 3   SYNTHROID 100 MCG tablet TAKE 1 TABLET BY MOUTH DAILY EXCEPT TAKE 1.5 TABLETS TUESDAY & THURSDAY BEFORE BREAKFAST. 90 tablet 3   valACYclovir (VALTREX) 1000 MG tablet SMARTSIG:1 Tablet(s) By Mouth Every 12 Hours     XIIDRA 5 % SOLN INSTILL 1 DROP INTO BOTH EYES TWICE A DAY     No current facility-administered medications on file prior to visit.     Past Medical History:  Diagnosis Date   Abscess, periappendiceal  periappendiceal adhesions periappendiceal adhesions. This was associated with  Subsequent to that time she has had repeat laparoscopies for chronic, particularly right sided, pelvic pain on two occasions, one in 2000 and again in June of 2003.  At each time, pelvic adhesions were noted but no other significant pelvic pathology   Adenomyosis    uterine adenomyosis, in 1995 total abdominal hysterectomy, right ovarian cystectomy   Cancer ([facility])    melanoma - right leg - good now   GERD (gastroesophageal reflux  disease)    Headache    Migraines   Hypercholesterolemia    Hyperlipidemia    Hypothyroidism    Other and unspecified ovarian cysts    long history of recurring ovarian cysts   Right bundle branch block    Thyroid disease    Tobacco abuse    Toxic goiter    in 2005 had a thyroidectomy   Ulcer disease    currently asymptomatic and not on medication   Allergies  Allergen Reactions   Promethazine Hcl Anaphylaxis, Hives and Other (See Comments)    Lungs swell and cant breathe    Social History   Socioeconomic History   Marital status: Married    Spouse name: Not on file   Number of children: Not on file   Years of education: Not on file   Highest education level: 12th grade  Occupational History   Not on file  Tobacco Use   Smoking status: Former    Packs/day: 0.50    Years: 19.00    Total pack years: 9.50    Types: Cigarettes    Quit date: 07/26/2021    Years since quitting: 1.2   Smokeless tobacco: Never  Substance and Sexual Activity   Alcohol use: Yes    Comment: Drinks wine occas   Drug use: No   Sexual activity: Yes    Birth control/protection: Surgical  Other Topics Concern   Not on file  Social History Narrative   She is married with three children, and she has five sibling who are all still living.   Social Determinants of Health   Financial Resource Strain: Low Risk  (02/08/2022)   Overall Financial Resource Strain (CARDIA)    Difficulty of Paying Living Expenses: Not hard at all  Food Insecurity: No Food Insecurity (02/08/2022)   Hunger Vital Sign    Worried About Running Out of Food in the Last Year: Never true    Ran Out of Food in the Last Year: Never true  Transportation Needs: No Transportation Needs (02/08/2022)   PRAPARE - Hydrologist (Medical): No    Lack of Transportation (Non-Medical): No  Physical Activity: Insufficiently Active (02/08/2022)   Exercise Vital Sign    Days of Exercise per Week: 3 days     Minutes of Exercise per Session: 30 min  Stress: No Stress Concern Present (02/08/2022)   [place]    Feeling of Stress : Not at all  Social Connections: Unknown (02/08/2022)   Social Connection and Isolation Panel [NHANES]    Frequency of Communication with Friends and Family: More than three times a week    Frequency of Social Gatherings with Friends and Family: Once a week    Attends Religious Services: Patient refused    Active Member of Clubs or Organizations: No    Attends Music therapist: Not on file    Marital Status: Married    There were  no vitals filed for this visit. There is no height or weight on file to calculate BMI.  Physical Exam Vitals and nursing note reviewed.  Constitutional:      General: She is not in acute distress.    Appearance: She is well-developed.  HENT:     Head: Normocephalic and atraumatic.     Right Ear: Tympanic membrane and external ear normal.     Left Ear: Tympanic membrane, ear canal and external ear normal.     Mouth/Throat:     Mouth: Mucous membranes are moist.     Pharynx: Oropharynx is clear.  Eyes:     Conjunctiva/sclera: Conjunctivae normal.  Cardiovascular:     Rate and Rhythm: Normal rate and regular rhythm.     Pulses:          Dorsalis pedis pulses are 2+ on the right side and 2+ on the left side.     Heart sounds: No murmur heard. Pulmonary:     Effort: Pulmonary effort is normal. No respiratory distress.     Breath sounds: Normal breath sounds.  Abdominal:     Palpations: Abdomen is soft. There is no hepatomegaly or mass.     Tenderness: There is no abdominal tenderness.  Lymphadenopathy:     Cervical: No cervical adenopathy.  Skin:    General: Skin is warm.     Findings: No erythema or rash.  Neurological:     General: No focal deficit present.     Mental Status: She is alert and oriented to person, place, and time.     Cranial Nerves:  No cranial nerve deficit.     Gait: Gait normal.  Psychiatric:     Comments: Well groomed, good eye contact.     ASSESSMENT AND PLAN:  There are no diagnoses linked to this encounter.   No follow-ups on file.   Christina Watts G. Martinique, MD  [facility]. [place] office.  Discharge Instructions   None

## 2022-10-08 ENCOUNTER — Encounter: Payer: Self-pay | Admitting: Family Medicine

## 2022-10-08 MED ORDER — SERTRALINE HCL 100 MG PO TABS: 100.0000 mg | ORAL_TABLET | Freq: Every day | ORAL | 2 refills | Status: DC

## 2022-10-13 ENCOUNTER — Other Ambulatory Visit: Payer: Self-pay | Admitting: Family Medicine

## 2022-10-13 DIAGNOSIS — G43009 Migraine without aura, not intractable, without status migrainosus: Secondary | ICD-10-CM

## 2022-10-20 ENCOUNTER — Other Ambulatory Visit: Payer: Self-pay | Admitting: Family Medicine

## 2022-12-07 DIAGNOSIS — H16223 Keratoconjunctivitis sicca, not specified as Sjogren's, bilateral: Secondary | ICD-10-CM | POA: Diagnosis not present

## 2023-01-16 ENCOUNTER — Ambulatory Visit: Payer: BC Managed Care – PPO | Admitting: Family Medicine

## 2023-01-20 ENCOUNTER — Other Ambulatory Visit: Payer: Self-pay

## 2023-01-20 DIAGNOSIS — E890 Postprocedural hypothyroidism: Secondary | ICD-10-CM

## 2023-01-20 MED ORDER — SYNTHROID 100 MCG PO TABS: ORAL_TABLET | ORAL | 3 refills | Status: DC

## 2023-01-27 ENCOUNTER — Other Ambulatory Visit: Payer: Self-pay

## 2023-01-27 DIAGNOSIS — Z1272 Encounter for screening for malignant neoplasm of vagina: Secondary | ICD-10-CM | POA: Diagnosis not present

## 2023-01-27 DIAGNOSIS — Z1231 Encounter for screening mammogram for malignant neoplasm of breast: Secondary | ICD-10-CM | POA: Diagnosis not present

## 2023-01-27 DIAGNOSIS — Z1151 Encounter for screening for human papillomavirus (HPV): Secondary | ICD-10-CM | POA: Diagnosis not present

## 2023-01-27 DIAGNOSIS — Z01419 Encounter for gynecological examination (general) (routine) without abnormal findings: Secondary | ICD-10-CM | POA: Diagnosis not present

## 2023-01-27 DIAGNOSIS — Z6824 Body mass index (BMI) 24.0-24.9, adult: Secondary | ICD-10-CM | POA: Diagnosis not present

## 2023-02-08 NOTE — Progress Notes (Unsigned)
HPI: [name] is a 62 y.o. female with PMHx significant for anxiety,HTN,HLD,hypothyroidism,hypoparathyroidism, and allergieshere today for her routine physical.  Last CPE: 01/26/21 She does not have an exercise routine, states that she has given up bowling. At home she is active by going up and down the stairs at home several times a day. She ***reports a change in her work situation, having semi-retired due to worsening anxiety, and now works from [employer], which she finds therapeutic.  Regarding her diet, the patient states that she cooks at home regularly, incorporating grilled meats, vegetables, and salads. She admits to occasionally consuming sweets and expresses a particular fondness for ice cream fruit bars, which she believes are low in sugar. Christina Watts. mentions occasional alcohol consumption, primarily as a sleep aid, and describes her sleep patterns, including difficulties falling asleep until the early morning hours unless she takes Xanax.  Immunization History  Administered Date(s) Administered   H1N1 11/28/2008   Influenza, Seasonal, Injecte, Preservative Fre 10/28/2015   Influenza,inj,Quad PF,6+ Mos 10/25/2012, 12/28/2016, 01/02/2019, 10/11/2019, 01/13/2021, 09/15/2021, 10/07/2022   Influenza,trivalent, recombinat, inj, PF 01/23/2015   PFIZER(Purple Top)SARS-COV-2 Vaccination 09/04/2020, 09/25/2020   Pneumococcal Polysaccharide-23 01/02/2019   Tdap 07/01/2006, 12/26/2013, 01/08/2016   Zoster Recombinat (Shingrix) 01/28/2022   Health Maintenance  Topic Date Due   Zoster Vaccines- Shingrix (1 of 2) Never done   COVID-19 Vaccine (3 - Booster for [place] series) 02/13/2022 (Originally 11/20/2020)   MAMMOGRAM  12/27/2023   COLONOSCOPY (Pts 45-44yr Insurance coverage will need to be confirmed)  06/24/2025   TETANUS/TDAP  01/07/2026   INFLUENZA VACCINE  Completed   Hepatitis C Screening  Completed   HPV VACCINES  Aged Out   PAP  SMEAR-Modifier  Discontinued   HIV Screening  Discontinued  She follows with gyn regularly.  Anxiety on Alprazolam 0.5 mg 1/2-1 tab daily prn and Sertraline 100 mg daily.  HTN on Amlodipine 10 mg daily and Losartan 50 mg daily. Follows with cardiologist. Lab Results  Component Value Date   CREATININE 0.60 06/07/2022   BUN 17 06/07/2022   NA 138 06/07/2022   K 4.7 06/07/2022   CL 98 06/07/2022   CO2 27 06/07/2022   HLD on Rosuvastatin 20 mg daily and Zetia 10 mg daily. Lab Results  Component Value Date   CHOL 142 10/01/2021   HDL 71 10/01/2021   LDLCALC 47 10/01/2021   TRIG 144 10/01/2021   CHOLHDL 2.0 10/01/2021   Hypothyroidism on Synthroid 100 mcg daily.  Lab Results  Component Value Date   TSH 0.53 01/28/2022   Hypoparathyroidism: She is on Ca++ 600 mg bid. Developed after thyroidectomy. She has seen endocrinologist years ago. Lab Results  Component Value Date   PTH 18 01/28/2022   CALCIUM 9.0 06/07/2022   CAION 4.39 (L) 02/11/2022   PHOS 3.7 02/11/2022   emotional well-being, discussing the impact of her son Christina Watts's passing and the associated grief, especially during holidays and birthdays. She notes that she is managing her emotions with the help of counseling sessions, which she attends once a month.  ***She reports that she recently underwent a gynecological examination and mammography, with results indicating no abnormalities. She mentions that her previous gynecologist has retired, and she has seen a new doctor who is considering weaning her off estradiol, pending liver function test results from the current visit. The patient states that her estradiol dosage has been reduced from two to one per day due to the onset of nocturnal hot  flashes, which the new gynecologist suspects may be due to long-term medication use. Review of Systems  Constitutional:  Negative for appetite change and fever.  HENT:  Negative for hearing loss, mouth sores, sore throat and trouble  swallowing.   Eyes:  Negative for redness and visual disturbance.  Respiratory:  Negative for cough, shortness of breath and wheezing.   Cardiovascular:  Negative for chest pain and leg swelling.  Gastrointestinal:  Negative for abdominal pain, nausea and vomiting.       No changes in bowel habits.  Endocrine: Negative for cold intolerance, heat intolerance, polydipsia, polyphagia and polyuria.  Genitourinary:  Negative for decreased urine volume, dysuria, hematuria, vaginal bleeding and vaginal discharge.  Musculoskeletal:  Negative for gait problem and myalgias.  Skin:  Negative for color change and rash.  Allergic/Immunologic: Positive for environmental allergies.  Neurological:  Negative for syncope, weakness and headaches.  Hematological:  Negative for adenopathy. Does not bruise/bleed easily.  Psychiatric/Behavioral:  Negative for confusion and hallucinations. The patient is nervous/anxious.   All other systems reviewed and are negative.   Current Outpatient Medications on File Prior to Visit  Medication Sig Dispense Refill   ALPRAZolam (XANAX) 0.5 MG tablet TAKE 0.5-1 TABLETS (0.25-0.5 MG TOTAL) BY MOUTH DAILY AS NEEDED FOR ANXIETY 30 tablet 2   budesonide-formoterol (SYMBICORT) 160-4.5 MCG/ACT inhaler Inhale 2 puffs into the lungs 2 (two) times daily. 1 each 3   Butalbital-APAP-Caffeine 50-300-40 MG CAPS Take 1 capsule by mouth daily as needed. 20 capsule 1   calcium carbonate (OS-CAL) 600 MG TABS Take 1,200 mg by mouth 2 (two) times daily.     estradiol (ESTRACE) 2 MG tablet Take 2 mg by mouth 2 (two) times daily.      ezetimibe (ZETIA) 10 MG tablet TAKE 1 TABLET BY MOUTH EVERY DAY 90 tablet 2   Fluocinolone Acetonide 0.01 % OIL Place 1-2 drops in ear(s) in the morning and at bedtime. 20 mL 0   fluticasone (FLONASE) 50 MCG/ACT nasal spray Place 1-2 sprays into both nostrils daily. 48 mL 1   losartan (COZAAR) 50 MG tablet Take 1 tablet (50 mg total) by mouth daily. 90 tablet 3    LOTEMAX SM 0.38 % GEL Apply 1 drop to eye daily.     nitroGLYCERIN (NITROSTAT) 0.4 MG SL tablet Place 1 tablet (0.4 mg total) under the tongue every 5 (five) minutes as needed for chest pain. 75 tablet 1   omeprazole (PRILOSEC) 20 MG capsule TAKE 1 CAPSULE BY MOUTH EVERY DAY 90 capsule 3   rosuvastatin (CRESTOR) 20 MG tablet TAKE 1 TABLET BY MOUTH EVERY DAY 90 tablet 2   sertraline (ZOLOFT) 100 MG tablet Take 1 tablet (100 mg total) by mouth daily. 90 tablet 2   SYNTHROID 100 MCG tablet TAKE 1 TABLET BY MOUTH DAILY EXCEPT TAKE 1.5 TABLETS TUESDAY & THURSDAY BEFORE BREAKFAST. 90 tablet 3   valACYclovir (VALTREX) 1000 MG tablet SMARTSIG:1 Tablet(s) By Mouth Every 12 Hours     XIIDRA 5 % SOLN INSTILL 1 DROP INTO BOTH EYES TWICE A DAY     amLODipine (NORVASC) 10 MG tablet Take 1 tablet (10 mg total) by mouth daily. 90 tablet 3   No current facility-administered medications on file prior to visit.   Past Medical History:  Diagnosis Date   Abscess, periappendiceal    periappendiceal adhesions periappendiceal adhesions. This was associated with  Subsequent to that time she has had repeat laparoscopies for chronic, particularly right sided, pelvic pain on two occasions,  one in 2000 and again in June of 2003.  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   HARDWARE REMOVAL Left 04/19/2017   Procedure: HARDWARE REMOVAL LEFT ANKLE;  Surgeon: Earlie Server, MD;  Location: [facility];  Service: Orthopedics;  Laterality: Left;   left ankle surgery     OVARIAN CYST REMOVAL  1995   TOTAL ABDOMINAL HYSTERECTOMY W/ BILATERAL SALPINGOOPHORECTOMY  1995   TOTAL THYROIDECTOMY  05/13/2004   Multiple thyroid nodules, dominant mass, left thyroid lobe -- by, Earnstine Regal, M.D   TUBAL LIGATION  1989    Allergies  Allergen Reactions   Promethazine Hcl Anaphylaxis, Hives and Other (See Comments)    Lungs swell and cant breathe    Family History  Problem Relation Age of Onset   Heart disease Father    Heart attack Father    Asthma Mother    Diabetes Mother    Heart disease Other        fathers side has a strong history of heart disease   Hyperlipidemia Other        strong family history of    Social History   Socioeconomic History   Marital status: Married    Spouse name: Not on file   Number of children: Not on file   Years of education: Not on file   Highest education level: 12th grade  Occupational History   Not on file  Tobacco Use   Smoking status: Former    Packs/day: 0.50    Years: 19.00    Total pack years: 9.50    Types: Cigarettes    Quit date: 07/26/2021    Years since quitting: 1.5   Smokeless tobacco: Never  Substance and Sexual Activity   Alcohol use: Yes    Comment: Drinks wine occas   Drug use: No   Sexual activity: Yes    Birth control/protection: Surgical  Other Topics  Concern   Not on file  Social History Narrative   She is married with three children, and she has five sibling who are all still living.   Social Determinants of Health   Financial Resource Strain: Low Risk  (02/08/2022)   Overall Financial Resource Strain (CARDIA)    Difficulty of Paying Living Expenses: Not hard  at all  Food Insecurity: No Food Insecurity (02/08/2022)   Hunger Vital Sign    Worried About Running Out of Food in the Last Year: Never true    Ran Out of Food in the Last Year: Never true  Transportation Needs: No Transportation Needs (02/08/2022)   PRAPARE - Hydrologist (Medical): No    Lack of Transportation (Non-Medical): No  Physical Activity: Insufficiently Active (02/08/2022)   Exercise Vital Sign    Days of Exercise per Week: 3 days    Minutes of Exercise per Session: 30 min  Stress: No Stress Concern Present (02/08/2022)   [place]    Feeling of Stress : Not at all  Social Connections: Unknown (02/08/2022)   Social Connection and Isolation Panel [NHANES]    Frequency of Communication with Friends and Family: More than three times a week    Frequency of Social Gatherings with Friends and Family: Once a week    Attends Religious Services: Patient refused    Active Member of Clubs or Organizations: No    Attends Archivist Meetings: Not on file    Marital Status: Married   Vitals:   02/10/23 0949  BP: 122/80  Pulse: 94  Resp: 16  Temp: 98.6 F (37 C)  SpO2: 97%   Body mass index is 24.58 kg/m.  Wt Readings from Last 3 Encounters:  02/10/23 134 lb 6 oz (61 kg)  10/07/22 134 lb 4 oz (60.9 kg)  05/09/22 134 lb 3.2 oz (60.9 kg)   Physical Exam Vitals and nursing note reviewed.  Constitutional:      General: She is not in acute distress.    Appearance: She is well-developed and normal weight.  HENT:     Head: Normocephalic and atraumatic.      Right Ear: Hearing, tympanic membrane, ear canal and external ear normal.     Left Ear: Hearing, tympanic membrane, ear canal and external ear normal.     Mouth/Throat:     Mouth: Mucous membranes are moist.     Pharynx: Oropharynx is clear. Uvula midline.  Eyes:     Extraocular Movements: Extraocular movements intact.     Conjunctiva/sclera: Conjunctivae normal.     Pupils: Pupils are equal, round, and reactive to light.  Neck:     Thyroid: No thyroid mass.  Cardiovascular:     Rate and Rhythm: Normal rate and regular rhythm.     Heart sounds: No murmur heard.    Comments: DP and PT pulses present bilateral. Pulmonary:     Effort: Pulmonary effort is normal. No respiratory distress.     Breath sounds: Normal breath sounds.  Abdominal:     Palpations: Abdomen is soft. There is no hepatomegaly or mass.     Tenderness: There is no abdominal tenderness.  Genitourinary:    Comments: Deferred to gyn. Musculoskeletal:     Comments: No major deformity or signs of synovitis appreciated.  Lymphadenopathy:     Cervical: No cervical adenopathy.     Upper Body:     Right upper body: No supraclavicular adenopathy.     Left upper body: No supraclavicular adenopathy.  Skin:    General: Skin is warm.     Findings: No erythema or rash.  Neurological:     General: No focal deficit present.     Mental Status: She is alert and oriented to person, place, and time.     Cranial  Nerves: No cranial nerve deficit.     Coordination: Coordination normal.     Gait: Gait normal.     Deep Tendon Reflexes:     Reflex Scores:      Bicep reflexes are 2+ on the right side and 2+ on the left side.      Patellar reflexes are 2+ on the right side and 2+ on the left side. Psychiatric:        Speech: Speech normal.     Comments: Well groomed, good eye contact.   ASSESSMENT AND PLAN:  [name] was here today annual physical examination.  Orders Placed This Encounter  Procedures   Parathyroid  hormone, intact (no Ca)   Phosphorus   VITAMIN D 25 Hydroxy (Vit-D Deficiency, Fractures)   Calcium, ionized   Comprehensive metabolic panel   TSH   T4, free   Lipid panel   Jamisa was seen today for annual exam.  Diagnoses and all orders for this visit:  Routine general medical examination at a health care facility  Essential hypertension, benign -     Comprehensive metabolic panel; Future  Hypoparathyroidism, unspecified hypoparathyroidism type ([place]) -     Parathyroid hormone, intact (no Ca); Future -     Phosphorus -     VITAMIN D 25 Hydroxy (Vit-D Deficiency, Fractures); Future -     Calcium, ionized  Hypothyroidism, postsurgical -     TSH; Future -     T4, free; Future  Pure hypercholesterolemia -     Comprehensive metabolic panel; Future -     Lipid panel; Future    Orders Placed This Encounter  Procedures   Parathyroid hormone, intact (no Ca)   Phosphorus   VITAMIN D 25 Hydroxy (Vit-D Deficiency, Fractures)   Calcium, ionized   Comprehensive metabolic panel   TSH   T4, free   Lipid panel    No problem-specific Assessment & Plan notes found for this encounter.  No problem-specific Assessment & Plan notes found for this encounter.  Return in 6 months (on 08/11/2023) for chronic problems.  Gayle Collard G. [place], MD  [facility]. [place] office.

## 2023-02-09 DIAGNOSIS — Z0000 Encounter for general adult medical examination without abnormal findings: Secondary | ICD-10-CM | POA: Insufficient documentation

## 2023-02-10 ENCOUNTER — Encounter: Payer: Self-pay | Admitting: Family Medicine

## 2023-02-10 ENCOUNTER — Ambulatory Visit (INDEPENDENT_AMBULATORY_CARE_PROVIDER_SITE_OTHER): Payer: BC Managed Care – PPO | Admitting: Family Medicine

## 2023-02-10 VITALS — BP 122/80 | HR 94 | Temp 98.6°F | Resp 16 | Ht 62.0 in | Wt 134.4 lb

## 2023-02-10 DIAGNOSIS — E209 Hypoparathyroidism, unspecified: Secondary | ICD-10-CM

## 2023-02-10 DIAGNOSIS — I10 Essential (primary) hypertension: Secondary | ICD-10-CM | POA: Diagnosis not present

## 2023-02-10 DIAGNOSIS — E7800 Pure hypercholesterolemia, unspecified: Secondary | ICD-10-CM

## 2023-02-10 DIAGNOSIS — Z0000 Encounter for general adult medical examination without abnormal findings: Secondary | ICD-10-CM | POA: Diagnosis not present

## 2023-02-10 DIAGNOSIS — E890 Postprocedural hypothyroidism: Secondary | ICD-10-CM

## 2023-02-10 LAB — COMPREHENSIVE METABOLIC PANEL
ALT: 31 U/L (ref 0–35)
AST: 31 U/L (ref 0–37)
Albumin: 3.9 g/dL (ref 3.5–5.2)
Alkaline Phosphatase: 57 U/L (ref 39–117)
BUN: 25 mg/dL — ABNORMAL HIGH (ref 6–23)
CO2: 30 mEq/L (ref 19–32)
Calcium: 8.3 mg/dL — ABNORMAL LOW (ref 8.4–10.5)
Chloride: 102 mEq/L (ref 96–112)
Creatinine, Ser: 0.71 mg/dL (ref 0.40–1.20)
GFR: 90.75 mL/min (ref >60.00)
Glucose, Bld: 91 mg/dL (ref 70–99)
Potassium: 4.3 mEq/L (ref 3.5–5.1)
Sodium: 138 mEq/L (ref 135–145)
Total Bilirubin: 0.2 mg/dL (ref 0.2–1.2)
Total Protein: 6.9 g/dL (ref 6.0–8.3)

## 2023-02-10 LAB — LIPID PANEL
Cholesterol: 135 mg/dL (ref 0–200)
HDL: 69.8 mg/dL (ref >39.00)
LDL Cholesterol: 49 mg/dL (ref 0–99)
NonHDL: 65.22
Total CHOL/HDL Ratio: 2
Triglycerides: 82 mg/dL (ref 0.0–149.0)
VLDL: 16.4 mg/dL (ref 0.0–40.0)

## 2023-02-10 LAB — T4, FREE: Free T4: 0.81 ng/dL (ref 0.60–1.60)

## 2023-02-10 LAB — TSH: TSH: 0.29 u[IU]/mL — ABNORMAL LOW (ref 0.35–5.50)

## 2023-02-10 LAB — PHOSPHORUS: Phosphorus: 3.6 mg/dL (ref 2.3–4.6)

## 2023-02-10 LAB — VITAMIN D 25 HYDROXY (VIT D DEFICIENCY, FRACTURES): VITD: 59.71 ng/mL (ref 30.00–100.00)

## 2023-02-10 NOTE — Patient Instructions (Addendum)
A few things to remember from today's visit:  Routine general medical examination at a health care facility  Essential hypertension, benign - Plan: Comprehensive metabolic panel  Hypoparathyroidism, unspecified hypoparathyroidism type ([place]) - Plan: Parathyroid hormone, intact (no Ca), Phosphorus, VITAMIN D 25 Hydroxy (Vit-D Deficiency, Fractures), Calcium, ionized  Hypothyroidism, postsurgical - Plan: TSH, T4, free  Pure hypercholesterolemia - Plan: Comprehensive metabolic panel, Lipid panel  If you need refills for medications you take chronically, please call your pharmacy. Do not use My Chart to request refills or for acute issues that need immediate attention. If you send a my chart message, it may take a few days to be addressed, specially if I am not in the office.  Please be sure medication list is accurate. If a new problem present, please set up appointment sooner than planned today.  Health Maintenance, Female Adopting a healthy lifestyle and getting preventive care are important in promoting health and wellness. Ask your health care provider about: The right schedule for you to have regular tests and exams. Things you can do on your own to prevent diseases and keep yourself healthy. What should I know about diet, weight, and exercise? Eat a healthy diet  Eat a diet that includes plenty of vegetables, fruits, low-fat dairy products, and lean protein. Do not eat a lot of foods that are high in solid fats, added sugars, or sodium. Maintain a healthy weight Body mass index (BMI) is used to identify weight problems. It estimates body fat based on height and weight. Your health care provider can help determine your BMI and help you achieve or maintain a healthy weight. Get regular exercise Get regular exercise. This is one of the most important things you can do for your health. Most adults should: Exercise for at least 150 minutes each week. The exercise should increase your  heart rate and make you sweat (moderate-intensity exercise). Do strengthening exercises at least twice a week. This is in addition to the moderate-intensity exercise. Spend less time sitting. Even light physical activity can be beneficial. Watch cholesterol and blood lipids Have your blood tested for lipids and cholesterol at 63 years of age, then have this test every 5 years. Have your cholesterol levels checked more often if: Your lipid or cholesterol levels are high. You are older than 63 years of age. You are at high risk for heart disease. What should I know about cancer screening? Depending on your health history and family history, you may need to have cancer screening at various ages. This may include screening for: Breast cancer. Cervical cancer. Colorectal cancer. Skin cancer. Lung cancer. What should I know about heart disease, diabetes, and high blood pressure? Blood pressure and heart disease High blood pressure causes heart disease and increases the risk of stroke. This is more likely to develop in people who have high blood pressure readings or are overweight. Have your blood pressure checked: Every 3-5 years if you are 64-36 years of age. Every year if you are 39 years old or older. Diabetes Have regular diabetes screenings. This checks your fasting blood sugar level. Have the screening done: Once every three years after age 41 if you are at a normal weight and have a low risk for diabetes. More often and at a younger age if you are overweight or have a high risk for diabetes. What should I know about preventing infection? Hepatitis B If you have a higher risk for hepatitis B, you should be screened for this virus. Talk  with your health care provider to find out if you are at risk for hepatitis B infection. Hepatitis C Testing is recommended for: Everyone born from [birth year] through 1966. Anyone with known risk factors for hepatitis C. Sexually transmitted infections  (STIs) Get screened for STIs, including gonorrhea and chlamydia, if: You are sexually active and are younger than 63 years of age. You are older than 63 years of age and your health care provider tells you that you are at risk for this type of infection. Your sexual activity has changed since you were last screened, and you are at increased risk for chlamydia or gonorrhea. Ask your health care provider if you are at risk. Ask your health care provider about whether you are at high risk for HIV. Your health care provider may recommend a prescription medicine to help prevent HIV infection. If you choose to take medicine to prevent HIV, you should first get tested for HIV. You should then be tested every 3 months for as long as you are taking the medicine. Pregnancy If you are about to stop having your period (premenopausal) and you may become pregnant, seek counseling before you get pregnant. Take 400 to 800 micrograms (mcg) of folic acid every day if you become pregnant. Ask for birth control (contraception) if you want to prevent pregnancy. Osteoporosis and menopause Osteoporosis is a disease in which the bones lose minerals and strength with aging. This can result in bone fractures. If you are 48 years old or older, or if you are at risk for osteoporosis and fractures, ask your health care provider if you should: Be screened for bone loss. Take a calcium or vitamin D supplement to lower your risk of fractures. Be given hormone replacement therapy (HRT) to treat symptoms of menopause. Follow these instructions at home: Alcohol use Do not drink alcohol if: Your health care provider tells you not to drink. You are pregnant, may be pregnant, or are planning to become pregnant. If you drink alcohol: Limit how much you have to: 0-1 drink a day. Know how much alcohol is in your drink. In the [place] one drink equals one 12 oz bottle of beer (355 mL), one 5 oz glass of wine (148 mL), or one 1 oz glass  of hard liquor (44 mL). Lifestyle Do not use any products that contain nicotine or tobacco. These products include cigarettes, chewing tobacco, and vaping devices, such as e-cigarettes. If you need help quitting, ask your health care provider. Do not use street drugs. Do not share needles. Ask your health care provider for help if you need support or information about quitting drugs. General instructions Schedule regular health, dental, and eye exams. Stay current with your vaccines. Tell your health care provider if: You often feel depressed. You have ever been abused or do not feel safe at home. Summary Adopting a healthy lifestyle and getting preventive care are important in promoting health and wellness. Follow your health care provider's instructions about healthy diet, exercising, and getting tested or screened for diseases. Follow your health care provider's instructions on monitoring your cholesterol and blood pressure. This information is not intended to replace advice given to you by your health care provider. Make sure you discuss any questions you have with your health care provider. Document Revised: 05/03/2021 Document Reviewed: 05/03/2021 Elsevier Patient Education  [place].

## 2023-02-11 MED ORDER — SYNTHROID 100 MCG PO TABS: 100.0000 ug | ORAL_TABLET | Freq: Every day | ORAL | 3 refills | Status: DC

## 2023-02-11 NOTE — Assessment & Plan Note (Signed)
Continue Rosuvastatin 20 mg daily and low fat diet.

## 2023-02-11 NOTE — Assessment & Plan Note (Signed)
BP adequately controlled. Continue Amlodipine and Losartan as well as low salt diet.

## 2023-02-11 NOTE — Assessment & Plan Note (Signed)
We discussed the importance of regular physical activity and healthy diet for prevention of chronic illness and/or complications. Preventive guidelines reviewed. Vaccination up to date. Ca++ and vit D supplementation to continue. Next CPE in a year.

## 2023-02-11 NOTE — Assessment & Plan Note (Signed)
Problem has been stable, last TSH 0.5 in 01/2022. Continue Synthroid 100 mcg daily.

## 2023-02-12 ENCOUNTER — Encounter: Payer: Self-pay | Admitting: Family Medicine

## 2023-02-13 ENCOUNTER — Encounter: Payer: Self-pay | Admitting: Family Medicine

## 2023-02-13 LAB — EXTRA SPECIMEN

## 2023-02-13 LAB — PARATHYROID HORMONE, INTACT (NO CA): PTH: 27 pg/mL (ref 16–77)

## 2023-02-13 LAB — CALCIUM, IONIZED: Calcium, Ion: 4.6 mg/dL — ABNORMAL LOW (ref 4.7–5.5)

## 2023-02-13 NOTE — Telephone Encounter (Signed)
fyi

## 2023-02-15 ENCOUNTER — Encounter: Payer: Self-pay | Admitting: Family Medicine

## 2023-02-22 ENCOUNTER — Encounter: Payer: Self-pay | Admitting: Family Medicine

## 2023-02-28 ENCOUNTER — Other Ambulatory Visit: Payer: Self-pay | Admitting: Family Medicine

## 2023-02-28 DIAGNOSIS — F419 Anxiety disorder, unspecified: Secondary | ICD-10-CM

## 2023-03-06 DIAGNOSIS — L570 Actinic keratosis: Secondary | ICD-10-CM | POA: Diagnosis not present

## 2023-03-06 DIAGNOSIS — L578 Other skin changes due to chronic exposure to nonionizing radiation: Secondary | ICD-10-CM | POA: Diagnosis not present

## 2023-03-06 DIAGNOSIS — L814 Other melanin hyperpigmentation: Secondary | ICD-10-CM | POA: Diagnosis not present

## 2023-03-06 DIAGNOSIS — D225 Melanocytic nevi of trunk: Secondary | ICD-10-CM | POA: Diagnosis not present

## 2023-03-06 DIAGNOSIS — Z85820 Personal history of malignant melanoma of skin: Secondary | ICD-10-CM | POA: Diagnosis not present

## 2023-03-06 DIAGNOSIS — L820 Inflamed seborrheic keratosis: Secondary | ICD-10-CM | POA: Diagnosis not present

## 2023-03-25 ENCOUNTER — Other Ambulatory Visit: Payer: Self-pay | Admitting: Family Medicine

## 2023-03-27 ENCOUNTER — Other Ambulatory Visit: Payer: Self-pay | Admitting: Physician Assistant

## 2023-04-04 DIAGNOSIS — Z13820 Encounter for screening for osteoporosis: Secondary | ICD-10-CM | POA: Diagnosis not present

## 2023-04-11 ENCOUNTER — Other Ambulatory Visit (INDEPENDENT_AMBULATORY_CARE_PROVIDER_SITE_OTHER): Payer: BC Managed Care – PPO

## 2023-04-11 ENCOUNTER — Other Ambulatory Visit: Payer: Self-pay

## 2023-04-11 ENCOUNTER — Other Ambulatory Visit: Payer: BC Managed Care – PPO

## 2023-04-11 DIAGNOSIS — E209 Hypoparathyroidism, unspecified: Secondary | ICD-10-CM | POA: Diagnosis not present

## 2023-04-11 DIAGNOSIS — E890 Postprocedural hypothyroidism: Secondary | ICD-10-CM | POA: Diagnosis not present

## 2023-04-11 LAB — TSH: TSH: 0.5 u[IU]/mL (ref 0.35–5.50)

## 2023-04-11 LAB — CALCIUM: Calcium: 8.1 mg/dL — ABNORMAL LOW (ref 8.4–10.5)

## 2023-04-11 LAB — MAGNESIUM: Magnesium: 1.8 mg/dL (ref 1.5–2.5)

## 2023-04-18 ENCOUNTER — Encounter: Payer: Self-pay | Admitting: Family Medicine

## 2023-04-29 ENCOUNTER — Other Ambulatory Visit: Payer: Self-pay | Admitting: Cardiovascular Disease

## 2023-05-01 ENCOUNTER — Other Ambulatory Visit: Payer: Self-pay | Admitting: Cardiovascular Disease

## 2023-06-26 ENCOUNTER — Other Ambulatory Visit: Payer: Self-pay | Admitting: Physician Assistant

## 2023-07-06 ENCOUNTER — Ambulatory Visit (INDEPENDENT_AMBULATORY_CARE_PROVIDER_SITE_OTHER): Payer: BC Managed Care – PPO

## 2023-07-06 ENCOUNTER — Encounter: Payer: Self-pay | Admitting: Cardiovascular Disease

## 2023-07-06 ENCOUNTER — Ambulatory Visit: Payer: BC Managed Care – PPO | Attending: Cardiovascular Disease | Admitting: Cardiovascular Disease

## 2023-07-06 VITALS — BP 104/80 | HR 75 | Ht 62.0 in | Wt 134.8 lb

## 2023-07-06 DIAGNOSIS — R002 Palpitations: Secondary | ICD-10-CM | POA: Diagnosis not present

## 2023-07-06 DIAGNOSIS — I10 Essential (primary) hypertension: Secondary | ICD-10-CM

## 2023-07-06 LAB — BASIC METABOLIC PANEL
BUN/Creatinine Ratio: 25 (ref 12–28)
BUN: 18 mg/dL (ref 8–27)
CO2: 27 mmol/L (ref 20–29)
Calcium: 9.3 mg/dL (ref 8.7–10.3)
Chloride: 102 mmol/L (ref 96–106)
Creatinine, Ser: 0.73 mg/dL (ref 0.57–1.00)
Glucose: 85 mg/dL (ref 70–99)
Potassium: 4.9 mmol/L (ref 3.5–5.2)
Sodium: 142 mmol/L (ref 134–144)
eGFR: 92 mL/min/{1.73_m2} (ref >59)

## 2023-07-06 MED ORDER — NITROGLYCERIN 0.4 MG SL SUBL
SUBLINGUAL_TABLET | SUBLINGUAL | 6 refills | Status: AC
Start: 2023-07-06 — End: ?

## 2023-07-06 NOTE — Patient Instructions (Signed)
Medication Instructions:  REFILLED Nitroglycerin *If you need a refill on your cardiac medications before your next appointment, please call your pharmacy*   Lab Work: BMET today If you have labs (blood work) drawn today and your tests are completely normal, you will receive your results only by: MyChart Message (if you have MyChart) OR A paper copy in the mail If you have any lab test that is abnormal or we need to change your treatment, we will call you to review the results.   Testing/Procedures: Zio heart monitor x2 weeks Your physician has recommended that you wear an event monitor. Event monitors are medical devices that record the heart's electrical activity. Doctors most often [place] these monitors to diagnose arrhythmias. Arrhythmias are problems with the speed or rhythm of the heartbeat. The monitor is a small, portable device. You can wear one while you do your normal daily activities. This is usually used to diagnose what is causing palpitations/syncope (passing out).  Follow-Up: At [facility], you and your health needs are our priority.  As part of our continuing mission to provide you with exceptional heart care, we have created designated Provider Care Teams.  These Care Teams include your primary Cardiologist (physician) and Advanced Practice Providers (APPs -  Physician Assistants and Nurse Practitioners) who all work together to provide you with the care you need, when you need it.  Your next appointment:   1 year(s)  Provider:   [name], MD     Other Instructions Christina Watts- Long Term Monitor Instructions  Your physician has requested you wear a ZIO patch monitor for 14 days.  This is a single patch monitor. [employer] supplies one patch monitor per enrollment. Additional stickers are not available. Please do not apply patch if you will be having a Nuclear Stress Test,  Echocardiogram, Cardiac CT, MRI, or Chest Xray during the period you would be wearing the   monitor. The patch cannot be worn during these tests. You cannot remove and re-apply the  ZIO XT patch monitor.  Your ZIO patch monitor will be mailed 3 day USPS to your address on file. It may take 3-5 days  to receive your monitor after you have been enrolled.  Once you have received your monitor, please review the enclosed instructions. Your monitor  has already been registered assigning a specific monitor serial # to you.  Billing and Patient Assistance Program Information  We have supplied [employer] with any of your insurance information on file for billing purposes. [employer] offers a sliding scale Patient Assistance Program for patients that do not have  insurance, or whose insurance does not completely cover the cost of the ZIO monitor.  You must apply for the Patient Assistance Program to qualify for this discounted rate.  To apply, please call [employer] at [phone], select option 4, select option 2, ask to apply for  Patient Assistance Program. Christina Watts will ask your household income, and how many people  are in your household. They will quote your out-of-pocket cost based on that information.  [employer] will also be able to set up a 42-month, interest-free payment plan if needed.  Applying the monitor   Shave hair from upper left chest.  Hold abrader disc by orange tab. Rub abrader in 40 strokes over the upper left chest as  indicated in your monitor instructions.  Clean area with 4 enclosed alcohol pads. Let dry.  Apply patch as indicated in monitor instructions. Patch will be placed under collarbone on left  side of chest with arrow pointing upward.  Rub patch adhesive wings for 2 minutes. Remove white label marked "1". Remove the white  label marked "2". Rub patch adhesive wings for 2 additional minutes.  While looking in a mirror, press and release button in center of patch. A small green light will  flash 3-4 times. This will be your only indicator that the monitor has been  turned on.  Do not shower for the first 24 hours. You may shower after the first 24 hours.  Press the button if you feel a symptom. You will hear a small click. Record Date, Time and  Symptom in the Patient Logbook.  When you are ready to remove the patch, follow instructions on the last 2 pages of Patient  Logbook. Stick patch monitor onto the last page of Patient Logbook.  Place Patient Logbook in the blue and white box. Use locking tab on box and tape box closed  securely. The blue and white box has prepaid postage on it. Please place it in the mailbox as  soon as possible. Your physician should have your test results approximately 7 days after the  monitor has been mailed back to [facility].  Call [facility] Customer Care at [phone] if you have questions regarding  your ZIO XT patch monitor. Call them immediately if you see an orange light blinking on your  monitor.  If your monitor falls off in less than 4 days, contact our Monitor department at [phone].  If your monitor becomes loose or falls off after 4 days call [employer] at [phone] for  suggestions on securing your monitor

## 2023-07-06 NOTE — Progress Notes (Unsigned)
Enrolled patient for a 14 day Zio XT  monitor to be mailed to patients home  °

## 2023-07-06 NOTE — Progress Notes (Signed)
Cardiology Office Note:    Date:  07/06/2023   ID:  Christina Watts, DOB Jun 25, 1961, MRN [id number]  PCP:  [place], Betty G, MD   [facility] Providers Cardiologist:  [name], MD     Referring MD: [place], Betty G, MD   Chief Complaint  Patient presents with   Hypertension    History of Present Illness:    Christina Watts is a 62 y.o. female with a hx of:  Hyperlipidemia Hypertension Former smoker Family history of premature CAD GERD Postsurgical hypothyroidism Right Bundle Branch Block  Cardiac cath May 2016: Normal coronary arteries  The patient is here alone today.  She has been doing well and is approaching the 2-year anniversary without smoking.  She denies chest pain, chest pressure, or shortness of breath.  She walks up and down the steps 20 times per day.  She is having some heart palpitations at rest.  These do not occur with physical activity.  She denies lightheadedness or syncope.  She is bothered by her heart palpitations and would like to have this evaluated further.  Past Medical History:  Diagnosis Date   Abscess, periappendiceal    periappendiceal adhesions periappendiceal adhesions. This was associated with  Subsequent to that time she has had repeat laparoscopies for chronic, particularly right sided, pelvic pain on two occasions, one in 2000 and again in June of 2003.  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   HARDWARE REMOVAL Left 04/19/2017   Procedure: HARDWARE REMOVAL LEFT ANKLE;  Surgeon: Frederico Hamman, MD;  Location: [facility];  Service: Orthopedics;  Laterality: Left;   left ankle surgery     OVARIAN CYST REMOVAL  1995   TOTAL ABDOMINAL HYSTERECTOMY W/ BILATERAL SALPINGOOPHORECTOMY  1995   TOTAL THYROIDECTOMY  05/13/2004   Multiple thyroid nodules, dominant mass, left thyroid lobe -- by, Velora Heckler, M.D   TUBAL LIGATION  1989    Current Medications: Current Meds  Medication Sig   ALPRAZolam (XANAX) 0.5 MG tablet TAKE 0.5-1 TABLETS (0.25-0.5 MG TOTAL) BY MOUTH DAILY AS NEEDED FOR ANXIETY   amLODipine (NORVASC) 10 MG tablet TAKE 1 TABLET BY MOUTH EVERY DAY   Butalbital-APAP-Caffeine 50-300-40 MG CAPS Take 1 capsule by mouth daily as needed.   calcium carbonate (OS-CAL)  600 MG TABS Take 1,200 mg by mouth 2 (two) times daily.   ezetimibe (ZETIA) 10 MG tablet Take 1 tablet (10 mg total) by mouth daily. Please keeps scheduled appointment for future refills. Thank you.   losartan (COZAAR) 50 MG tablet TAKE 1 TABLET BY MOUTH EVERY DAY   omeprazole (PRILOSEC) 20 MG capsule TAKE 1 CAPSULE BY MOUTH EVERY DAY   rosuvastatin (CRESTOR) 20 MG tablet TAKE 1 TABLET BY MOUTH EVERY DAY   sertraline (ZOLOFT) 100 MG tablet Take 1 tablet (100 mg total) by mouth daily.   SYNTHROID 100 MCG tablet Take 1 tablet (100 mcg total) by mouth daily before  breakfast. TAKE 1 TABLET BY MOUTH DAILY EXCEPT TAKE 1.5 TABLETS TUESDAY & THURSDAY BEFORE BREAKFAST.   valACYclovir (VALTREX) 1000 MG tablet SMARTSIG:1 Tablet(s) By Mouth Every 12 Hours   XIIDRA 5 % SOLN INSTILL 1 DROP INTO BOTH EYES TWICE A DAY   [DISCONTINUED] nitroGLYCERIN (NITROSTAT) 0.4 MG SL tablet Place 1 tablet (0.4 mg total) under the tongue every 5 (five) minutes as needed for chest pain.     Allergies:   Promethazine hcl   Social History   Socioeconomic History   Marital status: Married    Spouse name: Not on file   Number of children: Not on file   Years of education: Not on file   Highest education level: 12th grade  Occupational History   Not on file  Tobacco Use   Smoking status: Former    Current packs/day: 0.00    Average packs/day: 0.5 packs/day for 19.0 years (9.5 ttl pk-yrs)    Types: Cigarettes    Start date: 07/26/2002    Quit date: 07/26/2021    Years since quitting: 1.9   Smokeless tobacco: Never  Substance and Sexual Activity   Alcohol use: Yes    Comment: Drinks wine occas   Drug use: No   Sexual activity: Yes    Birth control/protection: Surgical  Other Topics Concern   Not on file  Social History Narrative   She is married with three children, and she has five sibling who are all still living.   Social Determinants of Health   Financial Resource Strain: Low Risk  (02/08/2022)   Overall Financial Resource Strain (CARDIA)    Difficulty of Paying Living Expenses: Not hard at all  Food Insecurity: No Food Insecurity (02/08/2022)   Hunger Vital Sign    Worried About Running Out of Food in the Last Year: Never true    Ran Out of Food in the Last Year: Never true  Transportation Needs: No Transportation Needs (02/08/2022)   PRAPARE - Administrator, Civil Service (Medical): No    Lack of Transportation (Non-Medical): No  Physical Activity: Insufficiently Active (02/08/2022)   Exercise Vital Sign    Days of Exercise per Week: 3 days     Minutes of Exercise per Session: 30 min  Stress: No Stress Concern Present (02/08/2022)   [employer] of Occupational Health - Occupational Stress Questionnaire    Feeling of Stress : Not at all  Social Connections: Unknown (02/08/2022)   Social Connection and Isolation Panel [NHANES]    Frequency of Communication with Friends and Family: More than three times a week    Frequency of Social Gatherings with Friends and Family: Once a week    Attends Religious Services: Patient declined    Database administrator or Organizations: No    Attends Banker Meetings: Not on file  Marital Status: Married     Family History: The patient's family history includes Asthma in her mother; Diabetes in her mother; Heart attack in her father; Heart disease in her father and another family member; Hyperlipidemia in an other family member.  ROS:   Please see the history of present illness.    All other systems reviewed and are negative.  EKGs/Labs/Other Studies Reviewed:    The following studies were reviewed today: Cardiac Studies & Procedures   CARDIAC CATHETERIZATION  CARDIAC CATHETERIZATION 05/14/2015  Narrative Widely patent coronary arteries  Suspect noncardiac chest pain  Findings Coronary Findings Diagnostic  Dominance: Right  Left Anterior Descending The vessel is angiographically normal.  Left Circumflex The vessel is angiographically normal.  Right Coronary Artery The vessel is angiographically normal.  Intervention  No interventions have been documented.   STRESS TESTS  MYOCARDIAL PERFUSION IMAGING 04/29/2015  Narrative Low risk study.  There is a small sized, very mild, reversible defect in the basal inferoseptum that could represent a small area of ischemia but also could be due to shifting breast attenuation artifact.   Low normal LVF with EF 51%.     MONITORS  LONG TERM MONITOR (3-14 DAYS) 04/06/2022  Narrative Patch Wear Time:  4 days and 17  hours (2023-03-29T14:44:09-399 to 2023-04-03T08:28:59-0400)  Patient had a min HR of 52 bpm, max HR of 169 bpm, and avg HR of 74 bpm. Predominant underlying rhythm was Sinus Rhythm. 5 Supraventricular Tachycardia runs occurred, the run with the fastest interval lasting 7 beats with a max rate of 169 bpm, the longest lasting 6 beats with an avg rate of 111 bpm. Isolated SVEs were rare (<1.0%), SVE Couplets were rare (<1.0%), and SVE Triplets were rare (<1.0%). Isolated VEs were rare (<1.0%, 15), VE Triplets were rare (<1.0%, 1), and no VE Couplets were present.  SUMMARY: The basic rhythm is normal sinus with an average HR of 74 bpm. There are rare supraventricular runs limited to short runs of no more than 7 beats. No atrial fibrillation, bradycardic events, or other clinically significant arrhythmias.           EKG Interpretation Date/Time:  Thursday July 06 2023 09:28:35 EDT Ventricular Rate:  75 PR Interval:  156 QRS Duration:  82 QT Interval:  404 QTC Calculation: 451 R Axis:   67  Text Interpretation: Normal sinus rhythm Normal ECG Confirmed by [name] [phone]) on 07/06/2023 12:53:12 PM    Recent Labs: 02/10/2023: ALT 31; BUN 25; Creatinine, Ser 0.71; Potassium 4.3; Sodium 138 04/11/2023: Magnesium 1.8; TSH 0.50  Recent Lipid Panel    Component Value Date/Time   CHOL 135 02/10/2023 1051   CHOL 142 10/01/2021 0750   TRIG 82.0 02/10/2023 1051   HDL 69.80 02/10/2023 1051   HDL 71 10/01/2021 0750   CHOLHDL 2 02/10/2023 1051   VLDL 16.4 02/10/2023 1051   LDLCALC 49 02/10/2023 1051   LDLCALC 47 10/01/2021 0750     Risk Assessment/Calculations:                Physical Exam:    VS:  BP 104/80   Pulse 75   Ht 5\' 2"  (1.575 m)   Wt 134 lb 12.8 oz (61.1 kg)   SpO2 97%   BMI 24.66 kg/m     Wt Readings from Last 3 Encounters:  07/06/23 134 lb 12.8 oz (61.1 kg)  02/10/23 134 lb 6 oz (61 kg)  10/07/22 134 lb 4 oz (60.9 kg)     GEN:  Well nourished, well  developed in no acute distress HEENT: Normal NECK: No JVD; No carotid bruits LYMPHATICS: No lymphadenopathy CARDIAC: RRR, no murmurs, rubs, gallops RESPIRATORY:  Clear to auscultation without rales, wheezing or rhonchi  ABDOMEN: Soft, non-tender, non-distended MUSCULOSKELETAL:  No edema; No deformity  SKIN: Warm and dry NEUROLOGIC:  Alert and oriented x 3 PSYCHIATRIC:  Normal affect   ASSESSMENT:    1. Essential hypertension, benign   2. Hypocalcemia   3. Palpitations    PLAN:    In order of problems listed above:  Her blood pressure is well-controlled.  Losartan was added last year and the combination of losartan and amlodipine seem to have her blood pressure under very good control.  Will check a metabolic panel today. She requests follow-up labs for hypocalcemia.  Will do as part of her metabolic panel today.  Will forward to her PCP when they result. Check labs today.  Check a 2-week ZIO monitor.  Her last monitor showed some short supraventricular runs with no sustained arrhythmia.           Medication Adjustments/Labs and Tests Ordered: Current medicines are reviewed at length with the patient today.  Concerns regarding medicines are outlined above.  Orders Placed This Encounter  Procedures   Basic metabolic panel   LONG TERM MONITOR (3-14 DAYS)   EKG 12-Lead   Meds ordered this encounter  Medications   nitroGLYCERIN (NITROSTAT) 0.4 MG SL tablet    Sig: Dissolve 1 tablet under the tongue every 5 minutes as needed for chest pain. Max of 3 doses, then 911.    Dispense:  25 tablet    Refill:  6    Patient Instructions  Medication Instructions:  REFILLED Nitroglycerin *If you need a refill on your cardiac medications before your next appointment, please call your pharmacy*   Lab Work: BMET today If you have labs (blood work) drawn today and your tests are completely normal, you will receive your results only by: MyChart Message (if you have MyChart) OR A  paper copy in the mail If you have any lab test that is abnormal or we need to change your treatment, we will call you to review the results.   Testing/Procedures: Zio heart monitor x2 weeks Your physician has recommended that you wear an event monitor. Event monitors are medical devices that record the heart's electrical activity. Doctors most often [place] these monitors to diagnose arrhythmias. Arrhythmias are problems with the speed or rhythm of the heartbeat. The monitor is a small, portable device. You can wear one while you do your normal daily activities. This is usually used to diagnose what is causing palpitations/syncope (passing out).  Follow-Up: At [facility], you and your health needs are our priority.  As part of our continuing mission to provide you with exceptional heart care, we have created designated Provider Care Teams.  These Care Teams include your primary Cardiologist (physician) and Advanced Practice Providers (APPs -  Physician Assistants and Nurse Practitioners) who all work together to provide you with the care you need, when you need it.  Your next appointment:   1 year(s)  Provider:   [name], MD     Other Instructions Christena Deem- Long Term Monitor Instructions  Your physician has requested you wear a ZIO patch monitor for 14 days.  This is a single patch monitor. [employer] supplies one patch monitor per enrollment. Additional stickers are not available. Please do not apply patch if you will be having a  Nuclear Stress Test,  Echocardiogram, Cardiac CT, MRI, or Chest Xray during the period you would be wearing the  monitor. The patch cannot be worn during these tests. You cannot remove and re-apply the  ZIO XT patch monitor.  Your ZIO patch monitor will be mailed 3 day USPS to your address on file. It may take 3-5 days  to receive your monitor after you have been enrolled.  Once you have received your monitor, please review the enclosed instructions.  Your monitor  has already been registered assigning a specific monitor serial # to you.  Billing and Patient Assistance Program Information  We have supplied [employer] with any of your insurance information on file for billing purposes. [employer] offers a sliding scale Patient Assistance Program for patients that do not have  insurance, or whose insurance does not completely cover the cost of the ZIO monitor.  You must apply for the Patient Assistance Program to qualify for this discounted rate.  To apply, please call [employer] at [phone], select option 4, select option 2, ask to apply for  Patient Assistance Program. Meredeth Ide will ask your household income, and how many people  are in your household. They will quote your out-of-pocket cost based on that information.  [employer] will also be able to set up a 74-month, interest-free payment plan if needed.  Applying the monitor   Shave hair from upper left chest.  Hold abrader disc by orange tab. Rub abrader in 40 strokes over the upper left chest as  indicated in your monitor instructions.  Clean area with 4 enclosed alcohol pads. Let dry.  Apply patch as indicated in monitor instructions. Patch will be placed under collarbone on left  side of chest with arrow pointing upward.  Rub patch adhesive wings for 2 minutes. Remove white label marked "1". Remove the white  label marked "2". Rub patch adhesive wings for 2 additional minutes.  While looking in a mirror, press and release button in center of patch. A small green light will  flash 3-4 times. This will be your only indicator that the monitor has been turned on.  Do not shower for the first 24 hours. You may shower after the first 24 hours.  Press the button if you feel a symptom. You will hear a small click. Record Date, Time and  Symptom in the Patient Logbook.  When you are ready to remove the patch, follow instructions on the last 2 pages of Patient  Logbook. Stick patch monitor onto  the last page of Patient Logbook.  Place Patient Logbook in the blue and white box. Use locking tab on box and tape box closed  securely. The blue and white box has prepaid postage on it. Please place it in the mailbox as  soon as possible. Your physician should have your test results approximately 7 days after the  monitor has been mailed back to [facility].  Call [facility] Customer Care at [phone] if you have questions regarding  your ZIO XT patch monitor. Call them immediately if you see an orange light blinking on your  monitor.  If your monitor falls off in less than 4 days, contact our Monitor department at [phone].  If your monitor becomes loose or falls off after 4 days call [employer] at [phone] for  suggestions on securing your monitor    Signed, [name], MD  07/06/2023 12:53 PM    [facility]

## 2023-07-24 ENCOUNTER — Other Ambulatory Visit: Payer: Self-pay | Admitting: Family Medicine

## 2023-07-24 ENCOUNTER — Other Ambulatory Visit: Payer: Self-pay | Admitting: Cardiovascular Disease

## 2023-07-24 DIAGNOSIS — F419 Anxiety disorder, unspecified: Secondary | ICD-10-CM

## 2023-07-25 DIAGNOSIS — R002 Palpitations: Secondary | ICD-10-CM

## 2023-09-01 ENCOUNTER — Other Ambulatory Visit: Payer: Self-pay | Admitting: Family Medicine

## 2023-09-01 DIAGNOSIS — F419 Anxiety disorder, unspecified: Secondary | ICD-10-CM

## 2023-09-14 DIAGNOSIS — Z85820 Personal history of malignant melanoma of skin: Secondary | ICD-10-CM | POA: Diagnosis not present

## 2023-09-14 DIAGNOSIS — L814 Other melanin hyperpigmentation: Secondary | ICD-10-CM | POA: Diagnosis not present

## 2023-09-14 DIAGNOSIS — L820 Inflamed seborrheic keratosis: Secondary | ICD-10-CM | POA: Diagnosis not present

## 2023-09-14 DIAGNOSIS — D225 Melanocytic nevi of trunk: Secondary | ICD-10-CM | POA: Diagnosis not present

## 2023-09-14 DIAGNOSIS — L578 Other skin changes due to chronic exposure to nonionizing radiation: Secondary | ICD-10-CM | POA: Diagnosis not present

## 2023-10-26 ENCOUNTER — Other Ambulatory Visit: Payer: Self-pay | Admitting: Family Medicine

## 2023-12-12 ENCOUNTER — Other Ambulatory Visit: Payer: Self-pay | Admitting: Cardiovascular Disease

## 2023-12-12 DIAGNOSIS — H2513 Age-related nuclear cataract, bilateral: Secondary | ICD-10-CM | POA: Diagnosis not present

## 2023-12-12 DIAGNOSIS — H16223 Keratoconjunctivitis sicca, not specified as Sjogren's, bilateral: Secondary | ICD-10-CM | POA: Diagnosis not present

## 2023-12-12 DIAGNOSIS — Z961 Presence of intraocular lens: Secondary | ICD-10-CM | POA: Diagnosis not present

## 2023-12-12 DIAGNOSIS — H11432 Conjunctival hyperemia, left eye: Secondary | ICD-10-CM | POA: Diagnosis not present

## 2024-01-04 ENCOUNTER — Other Ambulatory Visit: Payer: Self-pay | Admitting: Family Medicine

## 2024-01-04 DIAGNOSIS — E890 Postprocedural hypothyroidism: Secondary | ICD-10-CM

## 2024-02-02 ENCOUNTER — Encounter: Payer: BC Managed Care – PPO | Admitting: Family Medicine

## 2024-02-12 ENCOUNTER — Encounter: Payer: BC Managed Care – PPO | Admitting: Family Medicine

## 2024-02-13 ENCOUNTER — Telehealth: Payer: Self-pay | Admitting: Family Medicine

## 2024-02-14 ENCOUNTER — Encounter: Payer: BC Managed Care – PPO | Admitting: Family Medicine

## 2024-02-20 ENCOUNTER — Ambulatory Visit (INDEPENDENT_AMBULATORY_CARE_PROVIDER_SITE_OTHER): Payer: BC Managed Care – PPO | Admitting: Family Medicine

## 2024-02-20 ENCOUNTER — Encounter: Payer: Self-pay | Admitting: Family Medicine

## 2024-02-20 VITALS — BP 120/80 | HR 97 | Resp 16 | Ht 62.0 in | Wt 139.0 lb

## 2024-02-20 DIAGNOSIS — Z0000 Encounter for general adult medical examination without abnormal findings: Secondary | ICD-10-CM

## 2024-02-20 DIAGNOSIS — E7800 Pure hypercholesterolemia, unspecified: Secondary | ICD-10-CM

## 2024-02-20 DIAGNOSIS — E890 Postprocedural hypothyroidism: Secondary | ICD-10-CM

## 2024-02-20 DIAGNOSIS — F419 Anxiety disorder, unspecified: Secondary | ICD-10-CM

## 2024-02-20 DIAGNOSIS — K219 Gastro-esophageal reflux disease without esophagitis: Secondary | ICD-10-CM

## 2024-02-20 DIAGNOSIS — Z23 Encounter for immunization: Secondary | ICD-10-CM | POA: Diagnosis not present

## 2024-02-20 DIAGNOSIS — E209 Hypoparathyroidism, unspecified: Secondary | ICD-10-CM

## 2024-02-20 DIAGNOSIS — R7303 Prediabetes: Secondary | ICD-10-CM

## 2024-02-20 LAB — BASIC METABOLIC PANEL
BUN: 21 mg/dL (ref 6–23)
CO2: 27 meq/L (ref 19–32)
Calcium: 8.7 mg/dL (ref 8.4–10.5)
Chloride: 100 meq/L (ref 96–112)
Creatinine, Ser: 0.7 mg/dL (ref 0.40–1.20)
GFR: 91.65 mL/min (ref >60.00)
Glucose, Bld: 86 mg/dL (ref 70–99)
Potassium: 4 meq/L (ref 3.5–5.1)
Sodium: 136 meq/L (ref 135–145)

## 2024-02-20 LAB — VITAMIN D 25 HYDROXY (VIT D DEFICIENCY, FRACTURES): VITD: 49.37 ng/mL (ref 30.00–100.00)

## 2024-02-20 LAB — LIPID PANEL
Cholesterol: 139 mg/dL (ref 0–200)
HDL: 86.2 mg/dL (ref >39.00)
LDL Cholesterol: 39 mg/dL (ref 0–99)
NonHDL: 53.08
Total CHOL/HDL Ratio: 2
Triglycerides: 71 mg/dL (ref 0.0–149.0)
VLDL: 14.2 mg/dL (ref 0.0–40.0)

## 2024-02-20 LAB — T4, FREE: Free T4: 0.94 ng/dL (ref 0.60–1.60)

## 2024-02-20 LAB — HEPATIC FUNCTION PANEL
ALT: 16 U/L (ref 0–35)
AST: 26 U/L (ref 0–37)
Albumin: 3.8 g/dL (ref 3.5–5.2)
Alkaline Phosphatase: 52 U/L (ref 39–117)
Bilirubin, Direct: 0 mg/dL (ref 0.0–0.3)
Total Bilirubin: 0.2 mg/dL (ref 0.2–1.2)
Total Protein: 7.1 g/dL (ref 6.0–8.3)

## 2024-02-20 LAB — PHOSPHORUS: Phosphorus: 3.9 mg/dL (ref 2.3–4.6)

## 2024-02-20 LAB — HEMOGLOBIN A1C: Hgb A1c MFr Bld: 6.1 % (ref 4.6–6.5)

## 2024-02-20 LAB — TSH: TSH: 0.26 u[IU]/mL — ABNORMAL LOW (ref 0.35–5.50)

## 2024-02-20 MED ORDER — ALPRAZOLAM 0.5 MG PO TABS: 0.2500 mg | ORAL_TABLET | Freq: Every day | ORAL | 3 refills | Status: DC | PRN

## 2024-02-20 NOTE — Progress Notes (Signed)
 HPI: Christina Watts is a 63 y.o. female  with PMHx significant for anxiety, HTN, HLD, hypothyroidism, hypoparathyroidism, and allergies, who is here today for her routine physical.  Last CPE: 02/10/2023  Exercise: Walking for about 4 hours total per day.  Diet: Primarily eating at home, including vegetables.  Sleep: 6 hours a night on average. Occasionally has difficulty falling asleep.   Smoking: Quit smoking in 2022; has not resumed.  Alcohol use: Drinking 1 shot of vodka to help her sleep  Dental: UTD, has her next routine cleaning in 1-2 months.  Vision: UTD with routine vision care.    Chronic medical problems:  Hypertension:  Medications: Amlodipine 10 mg once daily and Losartan 50 mg once daily.  Side effects: None.  Follows up with Dr. Excell Seltzer of cardiology; last seen 07/25/2023.  Negative for unusual or severe headache, visual changes, exertional chest pain, dyspnea,  focal weakness, or edema.  Lab Results  Component Value Date   CREATININE 0.73 07/06/2023   BUN 18 07/06/2023   NA 142 07/06/2023   K 4.9 07/06/2023   CL 102 07/06/2023   CO2 27 07/06/2023   Hyperlipidemia: Currently on Crestor 20 mg once daily and Zetia 10 mg once daily.  Following an overall healthy diet. Side effects from medication: None. Lab Results  Component Value Date   CHOL 135 02/10/2023   HDL 69.80 02/10/2023   LDLCALC 49 02/10/2023   TRIG 82.0 02/10/2023   CHOLHDL 2 02/10/2023   Hypoparathyroidism: Currently is on Synthroid 150 mcg twice weekly (Tuesdays/Thursdays) and OTC Calcium 600 mg BID. Tolerating medication well, no side effects reported. She has not noted dysphagia, palpitations, abdominal pain, changes in bowel habits, tremor, cold/heat intolerance, or abnormal weight loss.  Lab Results  Component Value Date   TSH 0.50 04/11/2023   Anxiety: Pt is on Sertraline 100 mg once daily and Xanax 0.5 mg as needed.  She notes that holidays and birthdays are difficult for her  since the death of her son.  Pt tends to take her Xanax more when she has increased stress/anxiety such as holidays/birthdays.  She still follows up with her therapist.   Immunization History  Administered Date(s) Administered   H1N1 11/28/2008   Influenza, Seasonal, Injecte, Preservative Fre 10/28/2015   Influenza,inj,Quad PF,6+ Mos 10/25/2012, 12/28/2016, 01/02/2019, 10/11/2019, 01/13/2021, 09/15/2021, 10/07/2022   Influenza,trivalent, recombinat, inj, PF 01/23/2015   PFIZER(Purple Top)SARS-COV-2 Vaccination 09/04/2020, 09/25/2020   Pneumococcal Polysaccharide-23 01/02/2019   Tdap 07/01/2006, 12/26/2013, 01/08/2016   Zoster Recombinant(Shingrix) 01/28/2022   Health Maintenance  Topic Date Due   COVID-19 Vaccine (3 - Pfizer risk series) 10/23/2020   Zoster Vaccines- Shingrix (2 of 2) 03/25/2022   INFLUENZA VACCINE  07/27/2023   MAMMOGRAM  12/27/2023   Colonoscopy  06/24/2025   DTaP/Tdap/Td (4 - Td or Tdap) 01/07/2026   Hepatitis C Screening  Completed   Pneumococcal Vaccine 71-45 Years old  Aged Out   HPV VACCINES  Aged Out   HIV Screening  Discontinued    She has the following concerns today:  Skin cancer: She reports new spots on her chest; near where cancerous cells were previously removed.  Pt has an upcoming appointment scheduled with dermatology to discuss this further.  She tends to wear sunscreen regularly, even when staying indoors.   Review of Systems  Current Outpatient Medications on File Prior to Visit  Medication Sig Dispense Refill   ALPRAZolam (XANAX) 0.5 MG tablet TAKE 0.5-1 TABLETS (0.25-0.5 MG TOTAL) BY MOUTH DAILY AS NEEDED FOR  ANXIETY 30 tablet 1   amLODipine (NORVASC) 10 MG tablet TAKE 1 TABLET BY MOUTH EVERY DAY 90 tablet 1   Butalbital-APAP-Caffeine 50-300-40 MG CAPS Take 1 capsule by mouth daily as needed. 20 capsule 1   calcium carbonate (OS-CAL) 600 MG TABS Take 1,200 mg by mouth 2 (two) times daily.     ezetimibe (ZETIA) 10 MG tablet Take 1  tablet (10 mg total) by mouth daily. 90 tablet 3   losartan (COZAAR) 50 MG tablet TAKE 1 TABLET BY MOUTH EVERY DAY 90 tablet 3   nitroGLYCERIN (NITROSTAT) 0.4 MG SL tablet Dissolve 1 tablet under the tongue every 5 minutes as needed for chest pain. Max of 3 doses, then 911. 25 tablet 6   omeprazole (PRILOSEC) 20 MG capsule TAKE 1 CAPSULE BY MOUTH EVERY DAY 90 capsule 3   rosuvastatin (CRESTOR) 20 MG tablet TAKE 1 TABLET BY MOUTH EVERY DAY 90 tablet 3   sertraline (ZOLOFT) 100 MG tablet TAKE 1 TABLET BY MOUTH EVERY DAY 90 tablet 2   SYNTHROID 100 MCG tablet TAKE 1 TABLET BY MOUTH DAILY EXCEPT TAKE 1.5 TABLETS TUESDAY & THURSDAY BEFORE BREAKFAST. 90 tablet 3   valACYclovir (VALTREX) 1000 MG tablet SMARTSIG:1 Tablet(s) By Mouth Every 12 Hours     XIIDRA 5 % SOLN INSTILL 1 DROP INTO BOTH EYES TWICE A DAY     No current facility-administered medications on file prior to visit.    Past Medical History:  Diagnosis Date   Abscess, periappendiceal    periappendiceal adhesions periappendiceal adhesions. This was associated with  Subsequent to that time she has had repeat laparoscopies for chronic, particularly right sided, pelvic pain on two occasions, one in 2000 and again in June of 2003.  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   Ulcer disease    currently asymptomatic and not on medication    Past Surgical History:  Procedure Laterality Date   ABDOMINAL HYSTERECTOMY      APPENDECTOMY     for obliteration of the appendiceal tip   BREAST BIOPSY  02/1992   biopsy of benign breast mass   CARDIAC CATHETERIZATION N/A 05/14/2015   Procedure: Left Heart Cath and Coronary Angiography;  Surgeon: [name], MD;  Location: [facility] INVASIVE CV LAB;  Service: Cardiovascular;  Laterality: N/A;   ESOPHAGOGASTRODUODENOSCOPY  12/06/2005   Normal esophagogastroduodenoscopy --  Danise Edge, M.D.   HARDWARE REMOVAL Left 04/19/2017   Procedure: HARDWARE REMOVAL LEFT ANKLE;  Surgeon: Frederico Hamman, MD;  Location: [facility];  Service: Orthopedics;  Laterality: Left;   left ankle surgery     OVARIAN CYST REMOVAL  1995   TOTAL ABDOMINAL HYSTERECTOMY W/ BILATERAL SALPINGOOPHORECTOMY  1995   TOTAL THYROIDECTOMY  05/13/2004   Multiple thyroid nodules, dominant mass, left thyroid lobe -- by, Velora Heckler, M.D   TUBAL LIGATION  1989    Allergies  Allergen Reactions   Promethazine Hcl Anaphylaxis, Hives and Other (See Comments)    Lungs swell and cant breathe    Family History  Problem Relation Age of Onset   Heart disease Father    Heart attack Father    Asthma Mother    Diabetes Mother    Heart disease Other        fathers side has a strong history of heart disease   Hyperlipidemia Other        strong family history of    Social History   Socioeconomic History   Marital status: Married    Spouse name: Not on file   Number of children: Not on file   Years of education: Not on file   Highest education level: 12th grade  Occupational History   Not on file  Tobacco Use   Smoking status: Former    Current packs/day: 0.00    Average packs/day: 0.5 packs/day for 19.0 years (9.5 ttl pk-yrs)    Types: Cigarettes    Start date: 07/26/2002    Quit date: 07/26/2021    Years since quitting: 2.5   Smokeless tobacco: Never  Substance and Sexual Activity   Alcohol use: Yes    Comment: Drinks wine occas   Drug use: No   Sexual activity: Yes    Birth  control/protection: Surgical  Other Topics Concern   Not on file  Social History Narrative   She is married with three children, and she has five sibling who are all still living.   Social Drivers of Health   Financial Resource Strain: Patient Declined (02/19/2024)   Overall Financial Resource Strain (CARDIA)    Difficulty of Paying Living Expenses: Patient declined  Food Insecurity: Patient Declined (02/19/2024)   Hunger Vital Sign    Worried About Running Out of Food in the Last Year: Patient declined    Ran Out of Food in the Last Year: Patient declined  Transportation Needs: Patient Declined (02/19/2024)   PRAPARE - Administrator, Civil Service (Medical): Patient declined    Lack of Transportation (Non-Medical): Patient declined  Physical Activity: Unknown (02/19/2024)   Exercise Vital Sign    Days of Exercise per Week: Patient declined    Minutes of Exercise per Session: Not on file  Stress: No Stress Concern Present (02/08/2022)   [employer] of Occupational Health - Occupational Stress Questionnaire    Feeling of Stress : Not at all  Social Connections: Unknown (02/19/2024)   Social Connection and Isolation Panel [NHANES]    Frequency of Communication with Friends and Family: Patient declined    Frequency of Social Gatherings with Friends and Family: Patient declined    Attends Religious Services: Patient declined    Database administrator or Organizations: Patient declined    Attends Banker Meetings: Not on file    Marital Status: Married    There were no vitals filed for this visit. There is no height or weight on file to calculate BMI.  Wt Readings from Last 3 Encounters:  07/06/23 134 lb 12.8 oz (61.1 kg)  02/10/23 134 lb 6 oz (61 kg)  10/07/22 134 lb 4 oz (60.9 kg)    Physical Exam  ASSESSMENT AND PLAN: [name] was here today annual physical examination.  No orders of the defined types were placed in this  encounter.   There are no diagnoses linked to this encounter.  No follow-ups on file.   I, Isabelle Course, acting  as a scribe for Christina Yeager [place], MD., have documented all relevant documentation on the behalf of Christina Rhett [place], MD, as directed by  Christina Monical [place], MD while in the presence of Christina Axtell [place], MD.  I, Isabelle Course, have reviewed all documentation for this visit. The documentation on 02/20/24 for the exam, diagnosis, procedures, and orders are all accurate and complete.  Joal Eakle G. [place], MD  [facility]. [place] [facility].

## 2024-02-20 NOTE — Patient Instructions (Addendum)
 A few things to remember from today's visit:  Routine general medical examination at a health care facility  Hypothyroidism, postsurgical - Plan: TSH, T4, free  Hypoparathyroidism, unspecified hypoparathyroidism type (HCC) - Plan: Basic metabolic panel, VITAMIN D 25 Hydroxy (Vit-D Deficiency, Fractures), Parathyroid hormone, intact (no Ca), Phosphorus  Pure hypercholesterolemia - Plan: Hepatic function panel, Lipid panel  Prediabetes - Plan: Hemoglobin A1c  Anxiety disorder, unspecified type - Plan: ALPRAZolam (XANAX) 0.5 MG tablet  Gastroesophageal reflux disease, unspecified whether esophagitis present  No changes today. If refills for Xanax are needed I need to see you in 6 months.  If you need refills for medications you take chronically, please call your pharmacy. Do not use My Chart to request refills or for acute issues that need immediate attention. If you send a my chart message, it may take a few days to be addressed, specially if I am not in the office.  Please be sure medication list is accurate. If a new problem present, please set up appointment sooner than planned today.  Health Maintenance, Female Adopting a healthy lifestyle and getting preventive care are important in promoting health and wellness. Ask your health care provider about: The right schedule for you to have regular tests and exams. Things you can do on your own to prevent diseases and keep yourself healthy. What should I know about diet, weight, and exercise? Eat a healthy diet  Eat a diet that includes plenty of vegetables, fruits, low-fat dairy products, and lean protein. Do not eat a lot of foods that are high in solid fats, added sugars, or sodium. Maintain a healthy weight Body mass index (BMI) is used to identify weight problems. It estimates body fat based on height and weight. Your health care provider can help determine your BMI and help you achieve or maintain a healthy weight. Get regular  exercise Get regular exercise. This is one of the most important things you can do for your health. Most adults should: Exercise for at least 150 minutes each week. The exercise should increase your heart rate and make you sweat (moderate-intensity exercise). Do strengthening exercises at least twice a week. This is in addition to the moderate-intensity exercise. Spend less time sitting. Even light physical activity can be beneficial. Watch cholesterol and blood lipids Have your blood tested for lipids and cholesterol at 64 years of age, then have this test every 5 years. Have your cholesterol levels checked more often if: Your lipid or cholesterol levels are high. You are older than 64 years of age. You are at high risk for heart disease. What should I know about cancer screening? Depending on your health history and family history, you may need to have cancer screening at various ages. This may include screening for: Breast cancer. Cervical cancer. Colorectal cancer. Skin cancer. Lung cancer. What should I know about heart disease, diabetes, and high blood pressure? Blood pressure and heart disease High blood pressure causes heart disease and increases the risk of stroke. This is more likely to develop in people who have high blood pressure readings or are overweight. Have your blood pressure checked: Every 3-5 years if you are 45-26 years of age. Every year if you are 79 years old or older. Diabetes Have regular diabetes screenings. This checks your fasting blood sugar level. Have the screening done: Once every three years after age 68 if you are at a normal weight and have a low risk for diabetes. More often and at a younger age if  you are overweight or have a high risk for diabetes. What should I know about preventing infection? Hepatitis B If you have a higher risk for hepatitis B, you should be screened for this virus. Talk with your health care provider to find out if you are at  risk for hepatitis B infection. Hepatitis C Testing is recommended for: Everyone born from [birth year] through 1966. Anyone with known risk factors for hepatitis C. Sexually transmitted infections (STIs) Get screened for STIs, including gonorrhea and chlamydia, if: You are sexually active and are younger than 64 years of age. You are older than 64 years of age and your health care provider tells you that you are at risk for this type of infection. Your sexual activity has changed since you were last screened, and you are at increased risk for chlamydia or gonorrhea. Ask your health care provider if you are at risk. Ask your health care provider about whether you are at high risk for HIV. Your health care provider may recommend a prescription medicine to help prevent HIV infection. If you choose to take medicine to prevent HIV, you should first get tested for HIV. You should then be tested every 3 months for as long as you are taking the medicine. Pregnancy If you are about to stop having your period (premenopausal) and you may become pregnant, seek counseling before you get pregnant. Take 400 to 800 micrograms (mcg) of folic acid every day if you become pregnant. Ask for birth control (contraception) if you want to prevent pregnancy. Osteoporosis and menopause Osteoporosis is a disease in which the bones lose minerals and strength with aging. This can result in bone fractures. If you are 46 years old or older, or if you are at risk for osteoporosis and fractures, ask your health care provider if you should: Be screened for bone loss. Take a calcium or vitamin D supplement to lower your risk of fractures. Be given hormone replacement therapy (HRT) to treat symptoms of menopause. Follow these instructions at home: Alcohol use Do not drink alcohol if: Your health care provider tells you not to drink. You are pregnant, may be pregnant, or are planning to become pregnant. If you drink alcohol: Limit  how much you have to: 0-1 drink a day. Know how much alcohol is in your drink. In the [place] one drink equals one 12 oz bottle of beer (355 mL), one 5 oz glass of wine (148 mL), or one 1 oz glass of hard liquor (44 mL). Lifestyle Do not use any products that contain nicotine or tobacco. These products include cigarettes, chewing tobacco, and vaping devices, such as e-cigarettes. If you need help quitting, ask your health care provider. Do not use street drugs. Do not share needles. Ask your health care provider for help if you need support or information about quitting drugs. General instructions Schedule regular health, dental, and eye exams. Stay current with your vaccines. Tell your health care provider if: You often feel depressed. You have ever been abused or do not feel safe at home. Summary Adopting a healthy lifestyle and getting preventive care are important in promoting health and wellness. Follow your health care provider's instructions about healthy diet, exercising, and getting tested or screened for diseases. Follow your health care provider's instructions on monitoring your cholesterol and blood pressure. This information is not intended to replace advice given to you by your health care provider. Make sure you discuss any questions you have with your health care provider. Document Revised:  05/03/2021 Document Reviewed: 05/03/2021 Elsevier Patient Education  2024 [employer].

## 2024-02-21 LAB — PARATHYROID HORMONE, INTACT (NO CA): PTH: 12 pg/mL — ABNORMAL LOW (ref 16–77)

## 2024-02-22 MED ORDER — SYNTHROID 100 MCG PO TABS: 100.0000 ug | ORAL_TABLET | Freq: Every day | ORAL | Status: DC

## 2024-02-22 NOTE — Assessment & Plan Note (Signed)
 HgA1C 6.2 in 01/2022. Encouraged consistency with a healthy life style for diabetes prevention.

## 2024-02-22 NOTE — Assessment & Plan Note (Signed)
 Currently on Rosuvastatin 20 mg daily and low fat diet.

## 2024-02-22 NOTE — Assessment & Plan Note (Signed)
We discussed the importance of regular physical activity and healthy diet for prevention of chronic illness and/or complications. Preventive guidelines reviewed. Vaccination up-to-date. Continue her female preventive care with gynecologist. Next CPE in a year. 

## 2024-02-22 NOTE — Assessment & Plan Note (Signed)
 Problem is well controlled. Currently on Omeprazole 20 mg daily. Continue GERD precautions.

## 2024-02-22 NOTE — Assessment & Plan Note (Signed)
 Stable. Continue sertraline 100 mg daily and alprazolam 0.5 mg 1/2 to 1 tablet daily as needed. Continue CBT. If Rx sent for Alprazolam lasts all year, we can continue annual follow up, otherwise q 6 months.

## 2024-02-22 NOTE — Assessment & Plan Note (Signed)
 Problem has been stable, last TSH 0.5 in 01/2022. Continue same Synthroid dose. Further recommendations according to TSH result.

## 2024-02-22 NOTE — Assessment & Plan Note (Addendum)
 Continue calcium 600 mg bid. Further recommendations according to lab results.

## 2024-02-23 ENCOUNTER — Encounter: Payer: Self-pay | Admitting: Family Medicine

## 2024-02-23 NOTE — Telephone Encounter (Signed)
 She was already taking 100 mcg daily

## 2024-03-14 DIAGNOSIS — L570 Actinic keratosis: Secondary | ICD-10-CM | POA: Diagnosis not present

## 2024-03-14 DIAGNOSIS — L578 Other skin changes due to chronic exposure to nonionizing radiation: Secondary | ICD-10-CM | POA: Diagnosis not present

## 2024-03-14 DIAGNOSIS — L814 Other melanin hyperpigmentation: Secondary | ICD-10-CM | POA: Diagnosis not present

## 2024-03-14 DIAGNOSIS — D225 Melanocytic nevi of trunk: Secondary | ICD-10-CM | POA: Diagnosis not present

## 2024-03-14 DIAGNOSIS — L820 Inflamed seborrheic keratosis: Secondary | ICD-10-CM | POA: Diagnosis not present

## 2024-03-24 ENCOUNTER — Other Ambulatory Visit: Payer: Self-pay | Admitting: Family Medicine

## 2024-04-22 ENCOUNTER — Encounter: Payer: Self-pay | Admitting: Family Medicine

## 2024-04-22 ENCOUNTER — Other Ambulatory Visit: Payer: Self-pay

## 2024-04-22 ENCOUNTER — Other Ambulatory Visit (INDEPENDENT_AMBULATORY_CARE_PROVIDER_SITE_OTHER)

## 2024-04-22 DIAGNOSIS — E890 Postprocedural hypothyroidism: Secondary | ICD-10-CM

## 2024-04-22 LAB — TSH: TSH: 1.08 u[IU]/mL (ref 0.35–5.50)

## 2024-04-23 DIAGNOSIS — Z1231 Encounter for screening mammogram for malignant neoplasm of breast: Secondary | ICD-10-CM | POA: Diagnosis not present

## 2024-04-23 DIAGNOSIS — Z01419 Encounter for gynecological examination (general) (routine) without abnormal findings: Secondary | ICD-10-CM | POA: Diagnosis not present

## 2024-04-23 DIAGNOSIS — Z6825 Body mass index (BMI) 25.0-25.9, adult: Secondary | ICD-10-CM | POA: Diagnosis not present

## 2024-04-30 ENCOUNTER — Other Ambulatory Visit: Payer: Self-pay | Admitting: Family Medicine

## 2024-04-30 DIAGNOSIS — F419 Anxiety disorder, unspecified: Secondary | ICD-10-CM

## 2024-07-02 ENCOUNTER — Telehealth: Payer: Self-pay | Admitting: Cardiovascular Disease

## 2024-07-02 MED ORDER — AMLODIPINE BESYLATE 10 MG PO TABS: 10.0000 mg | ORAL_TABLET | Freq: Every day | ORAL | 0 refills | Status: DC

## 2024-07-02 NOTE — Telephone Encounter (Signed)
*  STAT* If patient is at the pharmacy, call can be transferred to refill team.   1. Which medications need to be refilled? (please list name of each medication and dose if known)   amLODipine  (NORVASC ) 10 MG tablet    2. Which pharmacy/location (including street and city if local pharmacy) is medication to be sent to? [id number] - [place], [place] - [address] Phone: [phone]  Fax: [phone]     3. Do they need a 30 day or 90 day supply? Pt has appt on 8/20 please refill until then. Pt is out of medication

## 2024-07-02 NOTE — Telephone Encounter (Signed)
 Pt's medication was sent to pt's pharmacy as requested. Confirmation received.

## 2024-07-18 ENCOUNTER — Ambulatory Visit: Admitting: Cardiovascular Disease

## 2024-07-18 ENCOUNTER — Other Ambulatory Visit: Payer: Self-pay | Admitting: Family Medicine

## 2024-07-26 DIAGNOSIS — R1032 Left lower quadrant pain: Secondary | ICD-10-CM | POA: Diagnosis not present

## 2024-07-26 DIAGNOSIS — Z1211 Encounter for screening for malignant neoplasm of colon: Secondary | ICD-10-CM | POA: Diagnosis not present

## 2024-07-26 DIAGNOSIS — K219 Gastro-esophageal reflux disease without esophagitis: Secondary | ICD-10-CM | POA: Diagnosis not present

## 2024-07-28 ENCOUNTER — Other Ambulatory Visit: Payer: Self-pay | Admitting: Cardiovascular Disease

## 2024-08-14 ENCOUNTER — Encounter: Payer: Self-pay | Admitting: Cardiovascular Disease

## 2024-08-14 ENCOUNTER — Ambulatory Visit: Attending: Cardiology | Admitting: Cardiovascular Disease

## 2024-08-14 VITALS — BP 130/70 | HR 76 | Resp 16 | Ht 62.0 in | Wt 139.4 lb

## 2024-08-14 DIAGNOSIS — I10 Essential (primary) hypertension: Secondary | ICD-10-CM

## 2024-08-14 DIAGNOSIS — E782 Mixed hyperlipidemia: Secondary | ICD-10-CM | POA: Diagnosis not present

## 2024-08-14 MED ORDER — LOSARTAN POTASSIUM 50 MG PO TABS: 50.0000 mg | ORAL_TABLET | Freq: Every day | ORAL | 3 refills | Status: AC

## 2024-08-14 NOTE — Assessment & Plan Note (Signed)
 Blood pressure controlled.  Continue amlodipine  10 mg daily.  Resume losartan  50 mg daily (prescription sent in).  Labs reviewed with creatinine 0.7 and potassium 4.0.

## 2024-08-14 NOTE — Patient Instructions (Signed)
 Medication Instructions:  Refill for Losartan  has been sent to your pharmacy.  *If you need a refill on your cardiac medications before your next appointment, please call your pharmacy*  Lab Work: None ordered today. If you have labs (blood work) drawn today and your tests are completely normal, you will receive your results only by: MyChart Message (if you have MyChart) OR A paper copy in the mail If you have any lab test that is abnormal or we need to change your treatment, we will call you to review the results.  Testing/Procedures: None ordered today.  Follow-Up: At [facility], you and your health needs are our priority.  As part of our continuing mission to provide you with exceptional heart care, our providers are all part of one team.  This team includes your primary Cardiologist (physician) and Advanced Practice Providers or APPs (Physician Assistants and Nurse Practitioners) who all work together to provide you with the care you need, when you need it.  Your next appointment:   1 year(s)  Provider:   Ozell Fell, MD

## 2024-08-14 NOTE — Progress Notes (Signed)
 Cardiology Office Note:    Date:  08/14/2024   ID:  Christina Watts, DOB Dec 29, 1960, MRN [id number]  PCP:  [place], Betty G, MD   [facility] Providers Cardiologist:  Ozell Fell, MD     Referring MD: [place], Betty G, MD   Chief Complaint  Patient presents with   Hypertension    History of Present Illness:    Christina Watts is a 63 y.o. female with a hx of:  Hyperlipidemia Hypertension Former smoker Family history of premature CAD GERD Postsurgical hypothyroidism Right Bundle Branch Block  Cardiac cath May 2016: Normal coronary arteries  The patient is here alone today.  She is doing well from a cardiac perspective.  She denies any recent problems of chest pain, chest pressure, heart palpitations, or shortness of breath.  She has been able to stop smoking now about 4 years ago.  She ran out of losartan  3 weeks ago and has not been able to get a refill.  She request that we refill this for her today.   Current Medications: Current Meds  Medication Sig   ALPRAZolam  (XANAX ) 0.5 MG tablet Take 0.5-1 tablets (0.25-0.5 mg total) by mouth daily as needed for anxiety.   amLODipine  (NORVASC ) 10 MG tablet Take 1 tablet (10 mg total) by mouth daily.   Butalbital-APAP-Caffeine 50-300-40 MG CAPS Take 1 capsule by mouth daily as needed.   calcium  carbonate (OS-CAL) 600 MG TABS Take 1,200 mg by mouth 2 (two) times daily.   ezetimibe  (ZETIA ) 10 MG tablet Take 1 tablet (10 mg total) by mouth daily. Pt needs to keep upcoming appt in Aug for additional refills   nitroGLYCERIN  (NITROSTAT ) 0.4 MG SL tablet Dissolve 1 tablet under the tongue every 5 minutes as needed for chest pain. Max of 3 doses, then 911.   omeprazole  (PRILOSEC) 20 MG capsule TAKE 1 CAPSULE BY MOUTH EVERY DAY   rosuvastatin  (CRESTOR ) 20 MG tablet TAKE 1 TABLET BY MOUTH EVERY DAY   sertraline  (ZOLOFT ) 100 MG tablet TAKE 1 TABLET BY MOUTH EVERY DAY   SYNTHROID  100 MCG tablet Take 1 tablet (100 mcg total) by mouth daily  before breakfast.   valACYclovir  (VALTREX ) 1000 MG tablet SMARTSIG:1 Tablet(s) By Mouth Every 12 Hours     Allergies:   Promethazine hcl   ROS:   Please see the history of present illness.    All other systems reviewed and are negative.  EKGs/Labs/Other Studies Reviewed:    The following studies were reviewed today: Cardiac Studies & Procedures   ______________________________________________________________________________________________ CARDIAC CATHETERIZATION  CARDIAC CATHETERIZATION 05/14/2015  Conclusion Widely patent coronary arteries  Suspect noncardiac chest pain  Findings Coronary Findings Diagnostic  Dominance: Right  Left Anterior Descending The vessel is angiographically normal.  Left Circumflex The vessel is angiographically normal.  Right Coronary Artery The vessel is angiographically normal.  Intervention  No interventions have been documented.   STRESS TESTS  MYOCARDIAL PERFUSION IMAGING 04/29/2015  Interpretation Summary Low risk study.  There is a small sized, very mild, reversible defect in the basal inferoseptum that could represent a small area of ischemia but also could be due to shifting breast attenuation artifact.   Low normal LVF with EF 51%.      MONITORS  LONG TERM MONITOR (3-14 DAYS) 08/09/2023  Narrative Patch Wear Time:  2 days and 22 hours (2024-07-30T12:22:28-0400 to 2024-08-02T10:36:05-399)  Patient had a min HR of 51 bpm, max HR of 141 bpm, and avg HR of 76 bpm. Predominant underlying rhythm was Sinus  Rhythm. Isolated SVEs were rare (<1.0%), SVE Couplets were rare (<1.0%), and SVE Triplets were rare (<1.0%). Isolated VEs were rare (<1.0%), and no VE Couplets or VE Triplets were present.  SUMMARY: basic rhythm is normal sinus with an average HR of 76 bpm. Rare PAC's and PVC's. No sustained arrhythmia. Pt triggers do not correlate to any specific arrhythmia. Benign monitor findings.        ______________________________________________________________________________________________      EKG:   EKG Interpretation Date/Time:  Wednesday August 14 2024 13:21:44 EDT Ventricular Rate:  78 PR Interval:  154 QRS Duration:  84 QT Interval:  426 QTC Calculation: 485 R Axis:   41  Text Interpretation: Sinus rhythm with frequent Premature ventricular complexes When compared with ECG of 06-Jul-2023 09:28, Premature ventricular complexes are now Present Confirmed by Wonda Sharper [id number]) on 08/14/2024 1:48:33 PM    Recent Labs: 02/20/2024: ALT 16; BUN 21; Creatinine, Ser 0.70; Potassium 4.0; Sodium 136 04/22/2024: TSH 1.08  Recent Lipid Panel    Component Value Date/Time   CHOL 139 02/20/2024 1347   CHOL 142 10/01/2021 0750   TRIG 71.0 02/20/2024 1347   HDL 86.20 02/20/2024 1347   HDL 71 10/01/2021 0750   CHOLHDL 2 02/20/2024 1347   VLDL 14.2 02/20/2024 1347   LDLCALC 39 02/20/2024 1347   LDLCALC 47 10/01/2021 0750     Risk Assessment/Calculations:                Physical Exam:    VS:  BP 130/70 (BP Location: Left Arm, Patient Position: Sitting, Cuff Size: Normal)   Pulse 76   Resp 16   Ht 5' 2 (1.575 m)   Wt 139 lb 6.4 oz (63.2 kg)   SpO2 96%   BMI 25.50 kg/m     Wt Readings from Last 3 Encounters:  08/14/24 139 lb 6.4 oz (63.2 kg)  02/20/24 139 lb (63 kg)  07/06/23 134 lb 12.8 oz (61.1 kg)     GEN:  Well nourished, well developed in no acute distress HEENT: Normal NECK: No JVD; No carotid bruits LYMPHATICS: No lymphadenopathy CARDIAC: RRR, no murmurs, rubs, gallops RESPIRATORY:  Clear to auscultation without rales, wheezing or rhonchi  ABDOMEN: Soft, non-tender, non-distended MUSCULOSKELETAL:  No edema; No deformity  SKIN: Warm and dry NEUROLOGIC:  Alert and oriented x 3 PSYCHIATRIC:  Normal affect   Assessment & Plan Essential hypertension, benign Blood pressure controlled.  Continue amlodipine  10 mg daily.  Resume losartan  50 mg daily  (prescription sent in).  Labs reviewed with creatinine 0.7 and potassium 4.0. Mixed hyperlipidemia Treated with Zetia  and rosuvastatin .  Lipids are excellent with an LDL of 39, HDL 86, cholesterol 139.  Continue current management.  ALT is 16.            Medication Adjustments/Labs and Tests Ordered: Current medicines are reviewed at length with the patient today.  Concerns regarding medicines are outlined above.  Orders Placed This Encounter  Procedures   EKG 12-Lead   Meds ordered this encounter  Medications   losartan  (COZAAR ) 50 MG tablet    Sig: Take 1 tablet (50 mg total) by mouth daily.    Dispense:  90 tablet    Refill:  3    Patient Instructions  Medication Instructions:  Refill for Losartan  has been sent to your pharmacy.  *If you need a refill on your cardiac medications before your next appointment, please call your pharmacy*  Lab Work: None ordered today. If you have labs (blood work)  drawn today and your tests are completely normal, you will receive your results only by: MyChart Message (if you have MyChart) OR A paper copy in the mail If you have any lab test that is abnormal or we need to change your treatment, we will call you to review the results.  Testing/Procedures: None ordered today.  Follow-Up: At [facility], you and your health needs are our priority.  As part of our continuing mission to provide you with exceptional heart care, our providers are all part of one team.  This team includes your primary Cardiologist (physician) and Advanced Practice Providers or APPs (Physician Assistants and Nurse Practitioners) who all work together to provide you with the care you need, when you need it.  Your next appointment:   1 year(s)  Provider:   Ozell Fell, MD       Signed, Ozell Fell, MD  08/14/2024 4:34 PM    [facility]

## 2024-08-23 ENCOUNTER — Other Ambulatory Visit: Payer: Self-pay | Admitting: Cardiovascular Disease

## 2024-09-19 DIAGNOSIS — L814 Other melanin hyperpigmentation: Secondary | ICD-10-CM | POA: Diagnosis not present

## 2024-09-19 DIAGNOSIS — Z85820 Personal history of malignant melanoma of skin: Secondary | ICD-10-CM | POA: Diagnosis not present

## 2024-09-19 DIAGNOSIS — L821 Other seborrheic keratosis: Secondary | ICD-10-CM | POA: Diagnosis not present

## 2024-09-19 DIAGNOSIS — L570 Actinic keratosis: Secondary | ICD-10-CM | POA: Diagnosis not present

## 2024-09-19 DIAGNOSIS — C44519 Basal cell carcinoma of skin of other part of trunk: Secondary | ICD-10-CM | POA: Diagnosis not present

## 2024-09-19 DIAGNOSIS — D225 Melanocytic nevi of trunk: Secondary | ICD-10-CM | POA: Diagnosis not present

## 2024-09-26 ENCOUNTER — Other Ambulatory Visit: Payer: Self-pay | Admitting: Cardiovascular Disease

## 2024-10-07 ENCOUNTER — Other Ambulatory Visit: Payer: Self-pay | Admitting: Family Medicine

## 2024-10-07 DIAGNOSIS — F419 Anxiety disorder, unspecified: Secondary | ICD-10-CM

## 2024-10-11 DIAGNOSIS — Z1211 Encounter for screening for malignant neoplasm of colon: Secondary | ICD-10-CM | POA: Diagnosis not present

## 2024-10-11 DIAGNOSIS — K5730 Diverticulosis of large intestine without perforation or abscess without bleeding: Secondary | ICD-10-CM | POA: Diagnosis not present

## 2024-10-11 DIAGNOSIS — K648 Other hemorrhoids: Secondary | ICD-10-CM | POA: Diagnosis not present

## 2024-10-11 LAB — HM COLONOSCOPY

## 2024-10-21 ENCOUNTER — Encounter: Payer: Self-pay | Admitting: Family Medicine

## 2024-10-21 ENCOUNTER — Ambulatory Visit: Admitting: Family Medicine

## 2024-10-21 VITALS — BP 124/80 | HR 74 | Temp 98.4°F | Resp 16 | Ht 62.0 in | Wt 142.8 lb

## 2024-10-21 DIAGNOSIS — Z23 Encounter for immunization: Secondary | ICD-10-CM | POA: Diagnosis not present

## 2024-10-21 DIAGNOSIS — R112 Nausea with vomiting, unspecified: Secondary | ICD-10-CM

## 2024-10-21 DIAGNOSIS — R1010 Upper abdominal pain, unspecified: Secondary | ICD-10-CM

## 2024-10-21 LAB — COMPREHENSIVE METABOLIC PANEL WITH GFR
ALT: 19 U/L (ref 0–35)
AST: 28 U/L (ref 0–37)
Albumin: 3.8 g/dL (ref 3.5–5.2)
Alkaline Phosphatase: 47 U/L (ref 39–117)
BUN: 14 mg/dL (ref 6–23)
CO2: 30 meq/L (ref 19–32)
Calcium: 8.1 mg/dL — ABNORMAL LOW (ref 8.4–10.5)
Chloride: 100 meq/L (ref 96–112)
Creatinine, Ser: 0.67 mg/dL (ref 0.40–1.20)
GFR: 92.19 mL/min (ref >60.00)
Glucose, Bld: 87 mg/dL (ref 70–99)
Potassium: 4.3 meq/L (ref 3.5–5.1)
Sodium: 137 meq/L (ref 135–145)
Total Bilirubin: 0.2 mg/dL (ref 0.2–1.2)
Total Protein: 6.8 g/dL (ref 6.0–8.3)

## 2024-10-21 LAB — LIPASE: Lipase: 40 U/L (ref 11.0–59.0)

## 2024-10-21 MED ORDER — ONDANSETRON 4 MG PO TBDP: 4.0000 mg | ORAL_TABLET | Freq: Two times a day (BID) | ORAL | 0 refills | Status: AC | PRN

## 2024-10-21 NOTE — Progress Notes (Signed)
 "  ACUTE VISIT Chief Complaint  Patient presents with   Abdominal Pain    Pt reports she woke up yesterday 3am with abdominal pain, sharp pain, on MUQ and backpain. With  nausea and vomitting. Feeling tired today. Scared to eat after that.    Discussed the use of AI scribe software for clinical note transcription with the patient, who gave verbal consent to proceed. History of Present Illness Christina Watts is a 63 year old female with PMHx significant for HTN,GERD,hypothyroidism, anxiety, and hypoparathyroidism ;who presents with abdominal pain and vomiting as described above. Concerned about gallbladder problems.  She has been experiencing sharp abdominal pain, epigastric area, that began yesterday at 3 AM, waking her from sleep. The pain is radiates to her back. Initially, the pain was rated as a 10 out of 10 in intensity, which has since decreased to a 4 out of 10 as she has avoided eating.  She has vomited six times since the onset of pain, with the vomitus being bile without blood. Her throat is now sore from vomiting.  No associated heartburn, or acid reflux has been experienced, and she has not taken any medication for the pain due to inability to keep anything down.  She did take Pepto Bismol to help with nausea. No dietary changes, recent travel,or sick contact.  She experienced chills around 7 AM after the onset of symptoms but denies having a fever.  Her last bowel movement was the previous night, and she denies changes in bowel habits.  She has a history of diverticulosis, identified during a colonoscopy on October 11, 2024.  Lab Results  Component Value Date   ALT 16 02/20/2024   AST 26 02/20/2024   ALKPHOS 52 02/20/2024   BILITOT 0.2 02/20/2024   Lab Results  Component Value Date   NA 136 02/20/2024   CL 100 02/20/2024   K 4.0 02/20/2024   CO2 27 02/20/2024   BUN 21 02/20/2024   CREATININE 0.70 02/20/2024   GFR 91.65 02/20/2024   CALCIUM  8.7 02/20/2024   PHOS 3.9  02/20/2024   ALBUMIN 3.8 02/20/2024   GLUCOSE 86 02/20/2024   Review of Systems  Constitutional:  Positive for activity change. Negative for appetite change and unexpected weight change.  HENT:  Negative for mouth sores and trouble swallowing.   Respiratory:  Negative for cough, shortness of breath and wheezing.   Cardiovascular:  Negative for chest pain, palpitations and leg swelling.  Gastrointestinal:  Positive for nausea. Negative for blood in stool.  Genitourinary:  Negative for decreased urine volume, dysuria and hematuria.  Skin:  Negative for rash.  Neurological:  Negative for syncope and weakness.  See other pertinent positives and negatives in HPI.  Current Outpatient Medications on File Prior to Visit  Medication Sig Dispense Refill   ALPRAZolam  (XANAX ) 0.5 MG tablet TAKE 0.5-1 TABLETS (0.25-0.5 MG TOTAL) BY MOUTH DAILY AS NEEDED FOR ANXIETY 30 tablet 0   amLODipine  (NORVASC ) 10 MG tablet TAKE 1 TABLET BY MOUTH EVERY DAY 90 tablet 3   Butalbital-APAP-Caffeine 50-300-40 MG CAPS Take 1 capsule by mouth daily as needed. 20 capsule 1   calcium  carbonate (OS-CAL) 600 MG TABS Take 1,200 mg by mouth 2 (two) times daily.     ezetimibe  (ZETIA ) 10 MG tablet Take 1 tablet (10 mg total) by mouth daily. 90 tablet 3   losartan  (COZAAR ) 50 MG tablet Take 1 tablet (50 mg total) by mouth daily. 90 tablet 3   nitroGLYCERIN  (NITROSTAT ) 0.4 MG SL  tablet Dissolve 1 tablet under the tongue every 5 minutes as needed for chest pain. Max of 3 doses, then 911. 25 tablet 6   omeprazole  (PRILOSEC) 20 MG capsule TAKE 1 CAPSULE BY MOUTH EVERY DAY 90 capsule 3   rosuvastatin  (CRESTOR ) 20 MG tablet TAKE 1 TABLET BY MOUTH EVERY DAY 90 tablet 3   sertraline  (ZOLOFT ) 100 MG tablet TAKE 1 TABLET BY MOUTH EVERY DAY 90 tablet 2   SYNTHROID  100 MCG tablet Take 1 tablet (100 mcg total) by mouth daily before breakfast.     valACYclovir  (VALTREX ) 1000 MG tablet SMARTSIG:1 Tablet(s) By Mouth Every 12 Hours     No  current facility-administered medications on file prior to visit.    Past Medical History:  Diagnosis Date   Abscess, periappendiceal    periappendiceal adhesions periappendiceal adhesions. This was associated with  Subsequent to that time she has had repeat laparoscopies for chronic, particularly right sided, pelvic pain on two occasions, one in 2000 and again in June of 2003.  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   Ulcer disease    currently asymptomatic and not on medication   Allergies  Allergen Reactions   Promethazine Hcl Anaphylaxis, Hives and Other (See Comments)    Lungs swell and cant breathe    Social History   Socioeconomic History   Marital status: Married    Spouse name: Not on file   Number of children: Not on file   Years of education: Not on file   Highest education level: 12th grade  Occupational History   Not on file  Tobacco Use   Smoking status: Former    Current packs/day: 0.00    Average packs/day: 0.5 packs/day for 19.0 years (9.5 ttl pk-yrs)    Types: Cigarettes    Start date: 07/26/2002    Quit date: 07/26/2021    Years since quitting: 3.2   Smokeless tobacco: Never  Substance and Sexual Activity   Alcohol use: Yes    Comment: Drinks wine occas   Drug use: No   Sexual activity: Yes    Birth control/protection: Surgical  Other Topics Concern   Not on file  Social History Narrative   She is married with three children, and she has  five sibling who are all still living.   Social Drivers of Corporate Investment Banker Strain: Low Risk  (10/20/2024)   Overall Financial Resource Strain (CARDIA)    Difficulty of Paying Living Expenses: Not hard at all  Food Insecurity: No Food Insecurity (10/20/2024)   Hunger Vital Sign    Worried About Running Out of Food in the Last Year: Never true    Ran Out of Food in the Last Year: Never true  Transportation Needs: No Transportation Needs (10/20/2024)   PRAPARE - Transportation    Lack of Transportation (Medical): No    Lack of  Transportation (Non-Medical): No  Physical Activity: Unknown (10/20/2024)   Exercise Vital Sign    Days of Exercise per Week: Patient declined    Minutes of Exercise per Session: Not on file  Stress: Patient Declined (10/20/2024)   [employer] of Occupational Health - Occupational Stress Questionnaire    Feeling of Stress: Patient declined  Social Connections: Unknown (10/20/2024)   Social Connection and Isolation Panel    Frequency of Communication with Friends and Family: Patient declined    Frequency of Social Gatherings with Friends and Family: Patient declined    Attends Religious Services: Patient declined    Database Administrator or Organizations: Patient declined    Attends Banker Meetings: Not on file    Marital Status: Married   Vitals:   10/21/24 1137  BP: 124/80  Pulse: 74  Resp: 16  Temp: 98.4 F (36.9 C)  SpO2: 97%   Body mass index is 26.12 kg/m.  Physical Exam Vitals and nursing note reviewed.  Constitutional:      General: She is not in acute distress.    Appearance: She is well-developed.  HENT:     Head: Normocephalic and atraumatic.  Eyes:     Conjunctiva/sclera: Conjunctivae normal.  Cardiovascular:     Rate and Rhythm: Normal rate and regular rhythm.     Heart sounds: No murmur heard. Pulmonary:     Effort: Pulmonary effort is normal. No respiratory distress.     Breath sounds: Normal  breath sounds.  Abdominal:     General: Bowel sounds are normal. There is no abdominal bruit.     Palpations: Abdomen is soft. There is no hepatomegaly or mass.     Tenderness: There is abdominal tenderness in the right upper quadrant. There is no guarding or rebound. Negative signs include Murphy's sign.  Skin:    General: Skin is warm.     Findings: No erythema or rash.  Neurological:     General: No focal deficit present.     Mental Status: She is alert and oriented to person, place, and time.     Gait: Gait normal.  Psychiatric:        Mood and Affect: Mood and affect normal.   ASSESSMENT AND PLAN:  Ms. Dorado was seen today for abdominal pain, nausea,and vomiting. Orders Placed This Encounter  Procedures   US  ABDOMEN LIMITED RUQ (LIVER/GB)   Flu vaccine trivalent PF, 6mos and older(Flulaval,Afluria,Fluarix,Fluzone)   Comprehensive metabolic panel with GFR   Lipase   Lab Results  Component Value Date   LIPASE 40.0 10/21/2024   Lab Results  Component Value Date   NA 137 10/21/2024   CL 100 10/21/2024   K 4.3 10/21/2024   CO2 30 10/21/2024   BUN 14 10/21/2024   CREATININE 0.67 10/21/2024   GFR 92.19 10/21/2024   CALCIUM  8.1 (L) 10/21/2024   PHOS 3.9 02/20/2024   ALBUMIN 3.8 10/21/2024   GLUCOSE 87 10/21/2024   Lab Results  Component Value Date   ALT 19 10/21/2024   AST 28 10/21/2024   ALKPHOS 47 10/21/2024   BILITOT 0.2 10/21/2024   Upper abdominal pain On examination today no signs of acute abdomen, RUQ and epigastric pain with palpation. Possible etiologies discussed, cholelithiasis, dyspepsia, and pancreatitis among some.  Clearly instructed about warning signs. Further recommendations according to lab and imaging results.  -     Lipase; Future -     US  ABDOMEN LIMITED RUQ (LIVER/GB); Future  Nausea and vomiting in  adult  Adequate hydration, advance diet as tolerated, bland and small portions. Symptomatic treatment with Ondansetron  4 mg  recommended. Monitor for new symptoms.  -     Comprehensive metabolic panel with GFR; Future -     Lipase; Future  -     Ondansetron ; Take 1 tablet (4 mg total) by mouth every 12 (twelve) hours as needed for up to 5 days for nausea or vomiting.  Dispense: 10 tablet; Refill: 0 -     US  ABDOMEN LIMITED RUQ (LIVER/GB); Future  Influenza vaccine needed -     Flu vaccine trivalent PF, 6mos and older(Flulaval,Afluria,Fluarix,Fluzone)  Return if symptoms worsen or fail to improve, for keep next appointment.  Osiris Charles G. Nikkia Devoss, MD  [facility]. [place] office.   "

## 2024-10-21 NOTE — Patient Instructions (Addendum)
 A few things to remember from today's visit:  Influenza vaccine needed - Plan: Flu vaccine trivalent PF, 6mos and older(Flulaval,Afluria,Fluarix,Fluzone)  Nausea and vomiting in adult - Plan: US  ABDOMEN LIMITED RUQ (LIVER/GB), Comprehensive metabolic panel with GFR, Lipase, ondansetron  (ZOFRAN -ODT) 4 MG disintegrating tablet  Upper abdominal pain - Plan: US  ABDOMEN LIMITED RUQ (LIVER/GB), Lipase  If fever seek immediate medical attention.  If you need refills for medications you take chronically, please call your pharmacy. Do not use My Chart to request refills or for acute issues that need immediate attention. If you send a my chart message, it may take a few days to be addressed, specially if I am not in the office.  Please be sure medication list is accurate. If a new problem present, please set up appointment sooner than planned today.

## 2024-10-22 ENCOUNTER — Ambulatory Visit: Payer: Self-pay | Admitting: Family Medicine

## 2024-10-22 DIAGNOSIS — R112 Nausea with vomiting, unspecified: Secondary | ICD-10-CM

## 2024-10-22 DIAGNOSIS — R1010 Upper abdominal pain, unspecified: Secondary | ICD-10-CM

## 2024-10-26 ENCOUNTER — Ambulatory Visit (HOSPITAL_BASED_OUTPATIENT_CLINIC_OR_DEPARTMENT_OTHER)
Admission: RE | Admit: 2024-10-26 | Discharge: 2024-10-26 | Disposition: A | Discharge status: Home / Self Care | Source: Ambulatory Visit | Attending: Family Medicine | Admitting: Family Medicine

## 2024-10-26 DIAGNOSIS — K824 Cholesterolosis of gallbladder: Secondary | ICD-10-CM | POA: Diagnosis not present

## 2024-10-26 DIAGNOSIS — R1011 Right upper quadrant pain: Secondary | ICD-10-CM | POA: Diagnosis not present

## 2024-10-26 DIAGNOSIS — R1010 Upper abdominal pain, unspecified: Secondary | ICD-10-CM | POA: Insufficient documentation

## 2024-10-26 DIAGNOSIS — R112 Nausea with vomiting, unspecified: Secondary | ICD-10-CM | POA: Insufficient documentation

## 2024-11-04 NOTE — Telephone Encounter (Signed)
 Spoke with patient. Notified patient of results and Dr. Gib response. Patient states she would like to follow up with GI. Referral placed. Patient advised she will be notified by GI office to schedule appt once referral has been processed. Patient voiced understanding.

## 2024-11-13 ENCOUNTER — Encounter: Payer: Self-pay | Admitting: Family Medicine

## 2024-11-19 ENCOUNTER — Other Ambulatory Visit: Payer: Self-pay

## 2024-11-19 DIAGNOSIS — E890 Postprocedural hypothyroidism: Secondary | ICD-10-CM

## 2024-11-19 MED ORDER — SYNTHROID 100 MCG PO TABS: 100.0000 ug | ORAL_TABLET | Freq: Every day | ORAL | 3 refills | Status: AC

## 2025-01-15 ENCOUNTER — Other Ambulatory Visit: Payer: Self-pay | Admitting: Family Medicine

## 2025-01-15 DIAGNOSIS — F419 Anxiety disorder, unspecified: Secondary | ICD-10-CM

## 2025-01-23 ENCOUNTER — Other Ambulatory Visit: Payer: Self-pay | Admitting: Family Medicine

## 2025-01-23 DIAGNOSIS — F419 Anxiety disorder, unspecified: Secondary | ICD-10-CM

## 2025-02-21 ENCOUNTER — Encounter: Admitting: Family Medicine

## 2025-02-21 ENCOUNTER — Encounter: Payer: BC Managed Care – PPO | Admitting: Family Medicine
